# Patient Record
Sex: Female | Born: 1940 | Race: White | Hispanic: No | Smoking: Never smoker
Health system: Southern US, Community
[De-identification: ages and names within clinical notes are randomized; demographics above are authoritative.]

## PROBLEM LIST (undated history)

## (undated) DIAGNOSIS — M1990 Unspecified osteoarthritis, unspecified site: Secondary | ICD-10-CM

## (undated) DIAGNOSIS — I10 Essential (primary) hypertension: Secondary | ICD-10-CM

## (undated) DIAGNOSIS — M8580 Other specified disorders of bone density and structure, unspecified site: Secondary | ICD-10-CM

## (undated) HISTORY — PX: COLONOSCOPY: SHX174

## (undated) HISTORY — PX: CATARACT EXTRACTION, BILATERAL: SHX1313

## (undated) HISTORY — DX: Essential (primary) hypertension: I10

## (undated) HISTORY — DX: Unspecified osteoarthritis, unspecified site: M19.90

## (undated) HISTORY — PX: TOTAL ABDOMINAL HYSTERECTOMY W/ BILATERAL SALPINGOOPHORECTOMY: SHX83

## (undated) HISTORY — DX: Other specified disorders of bone density and structure, unspecified site: M85.80

## (undated) HISTORY — PX: TUBAL LIGATION: SHX77

## (undated) HISTORY — PX: BACK SURGERY: SHX140

---

## 1997-12-21 ENCOUNTER — Other Ambulatory Visit: Admission: RE | Admit: 1997-12-21 | Discharge: 1997-12-21 | Payer: Self-pay | Admitting: Obstetrics and Gynecology

## 1999-01-15 ENCOUNTER — Other Ambulatory Visit: Admission: RE | Admit: 1999-01-15 | Discharge: 1999-01-15 | Payer: Self-pay | Admitting: Urology

## 1999-02-05 ENCOUNTER — Other Ambulatory Visit: Admission: RE | Admit: 1999-02-05 | Discharge: 1999-02-05 | Payer: Self-pay | Admitting: Obstetrics and Gynecology

## 1999-09-12 ENCOUNTER — Other Ambulatory Visit: Admission: RE | Admit: 1999-09-12 | Discharge: 1999-09-12 | Payer: Self-pay | Admitting: Obstetrics and Gynecology

## 2000-02-20 ENCOUNTER — Other Ambulatory Visit: Admission: RE | Admit: 2000-02-20 | Discharge: 2000-02-20 | Payer: Self-pay | Admitting: Obstetrics and Gynecology

## 2001-05-16 ENCOUNTER — Other Ambulatory Visit: Admission: RE | Admit: 2001-05-16 | Discharge: 2001-05-16 | Payer: Self-pay | Admitting: Obstetrics and Gynecology

## 2002-06-14 ENCOUNTER — Other Ambulatory Visit: Admission: RE | Admit: 2002-06-14 | Discharge: 2002-06-14 | Payer: Self-pay | Admitting: Obstetrics and Gynecology

## 2003-06-09 ENCOUNTER — Emergency Department (HOSPITAL_COMMUNITY): Admission: EM | Admit: 2003-06-09 | Discharge: 2003-06-09 | Payer: Self-pay | Admitting: Emergency Medicine

## 2004-02-22 ENCOUNTER — Ambulatory Visit: Payer: Self-pay | Admitting: Internal Medicine

## 2004-12-24 ENCOUNTER — Ambulatory Visit: Payer: Self-pay | Admitting: Internal Medicine

## 2005-04-10 ENCOUNTER — Ambulatory Visit: Payer: Self-pay | Admitting: Internal Medicine

## 2006-02-04 ENCOUNTER — Ambulatory Visit: Payer: Self-pay | Admitting: Internal Medicine

## 2006-09-02 ENCOUNTER — Encounter: Payer: Self-pay | Admitting: Internal Medicine

## 2006-12-20 ENCOUNTER — Ambulatory Visit: Payer: Self-pay | Admitting: Internal Medicine

## 2006-12-20 DIAGNOSIS — I10 Essential (primary) hypertension: Secondary | ICD-10-CM | POA: Insufficient documentation

## 2006-12-23 ENCOUNTER — Encounter: Payer: Self-pay | Admitting: Internal Medicine

## 2007-01-04 ENCOUNTER — Encounter: Payer: Self-pay | Admitting: Internal Medicine

## 2007-04-25 ENCOUNTER — Encounter: Payer: Self-pay | Admitting: Internal Medicine

## 2007-07-25 ENCOUNTER — Encounter: Payer: Self-pay | Admitting: Internal Medicine

## 2007-08-29 ENCOUNTER — Encounter: Payer: Self-pay | Admitting: Internal Medicine

## 2007-09-06 ENCOUNTER — Ambulatory Visit: Payer: Self-pay | Admitting: Internal Medicine

## 2007-09-12 ENCOUNTER — Encounter (INDEPENDENT_AMBULATORY_CARE_PROVIDER_SITE_OTHER): Payer: Self-pay | Admitting: *Deleted

## 2007-10-25 ENCOUNTER — Ambulatory Visit: Payer: Self-pay | Admitting: Internal Medicine

## 2007-12-19 ENCOUNTER — Encounter: Payer: Self-pay | Admitting: Internal Medicine

## 2008-05-10 ENCOUNTER — Encounter: Payer: Self-pay | Admitting: Internal Medicine

## 2008-08-20 ENCOUNTER — Ambulatory Visit: Payer: Self-pay | Admitting: Internal Medicine

## 2008-08-20 DIAGNOSIS — M949 Disorder of cartilage, unspecified: Secondary | ICD-10-CM

## 2008-08-20 DIAGNOSIS — L4050 Arthropathic psoriasis, unspecified: Secondary | ICD-10-CM

## 2008-08-20 DIAGNOSIS — R7309 Other abnormal glucose: Secondary | ICD-10-CM

## 2008-08-20 DIAGNOSIS — M899 Disorder of bone, unspecified: Secondary | ICD-10-CM | POA: Insufficient documentation

## 2008-08-21 ENCOUNTER — Encounter: Payer: Self-pay | Admitting: Internal Medicine

## 2008-09-20 ENCOUNTER — Encounter: Payer: Self-pay | Admitting: Internal Medicine

## 2008-09-21 ENCOUNTER — Encounter: Payer: Self-pay | Admitting: Internal Medicine

## 2008-11-02 ENCOUNTER — Ambulatory Visit: Payer: Self-pay | Admitting: Internal Medicine

## 2009-03-14 ENCOUNTER — Encounter: Payer: Self-pay | Admitting: Internal Medicine

## 2009-07-25 ENCOUNTER — Encounter: Payer: Self-pay | Admitting: Internal Medicine

## 2009-09-10 ENCOUNTER — Ambulatory Visit: Payer: Self-pay | Admitting: Internal Medicine

## 2009-11-21 ENCOUNTER — Encounter: Payer: Self-pay | Admitting: Internal Medicine

## 2010-03-06 NOTE — Letter (Signed)
Summary: [facility]   Imported By: Lanelle Bal 08/12/2009 10:59:03  _____________________________________________________________________  External Attachment:    Type:   Image     Comment:   External Document

## 2010-03-06 NOTE — Letter (Signed)
Summary: [facility]   Imported By: Lanelle Bal 12/05/2009 12:13:45  _____________________________________________________________________  External Attachment:    Type:   Image     Comment:   External Document

## 2010-03-06 NOTE — Letter (Signed)
Summary: [facility]   Imported By: Lanelle Bal 03/27/2009 08:58:42  _____________________________________________________________________  External Attachment:    Type:   Image     Comment:   External Document

## 2010-03-06 NOTE — Assessment & Plan Note (Signed)
Summary: roa//lch   Vital Signs:  Patient profile:   70 year old female Weight:      167 pounds BMI:     30.66 Pulse rate:   64 / minute Resp:     15 per minute BP sitting:   124 / 82  (left arm) Cuff size:   large  Vitals Entered By: Shonna Chock CMA (September 10, 2009 3:01 PM) CC: 1.) Follow-up visit: refill meds 2.) Examine Back   CC:  1.) Follow-up visit: refill meds 2.) Examine Back.  History of Present Illness:  Hypertension Follow-Up      This is a 70 year old woman who presents for Hypertension follow-up.  The patient denies lightheadedness, urinary frequency, headaches, rash, and fatigue.  The patient denies the following associated symptoms: chest pain, chest pressure, exercise intolerance, dyspnea, palpitations, syncope, leg edema, and pedal edema.  Compliance with medications (by patient report) has been near 100%.  The patient reports that dietary compliance has been good.  The patient reports exercising 5X /week.  Adjunctive measures currently used by the patient include  modified salt restriction.  BP @ home 120-130/ 70-80. Labs done @ Dr Jeanine Luz every 8 weeks.Creatinine was 0.7.  Current Medications (verified): 1)  Hydrochlorothiazide 25 Mg  Tabs (Hydrochlorothiazide) .... 1/2 Tab Once Daily Needs Office Visit 2)  Toprol Xl 50 Mg Tb24 (Metoprolol Succinate) .... Take One-Half Tablet Daily 3)  Calcium With Vit D .... Take By Mouth As Directed 4)  Methotrexate 2.5 Mg  Tabs (Methotrexate Sodium) .... Take 6 Tablets Every Week By Mouth 5)  Folic Acid .... Take 1 Every Day 6)  Centrum Silver 7)  Vit C 8)  Fish Oil 9)  Vitamin D 2000 Unit Tabs (Cholecalciferol) .Marland Kitchen.. 1 By Mouth Once Daily  Allergies (verified): No Known Drug Allergies  Past History:  Past Medical History: Hypertension; Psoriatic arthritis, Dr  Syliva Overman PMH  of reaction to smallpox vaccine (convulsion, facial drawing); Hyperglycemia  Osteopenia, Dr Richardean Chimera  Physical Exam  General:   well-nourished; alert,appropriate and cooperative throughout examination Neck:  No deformities, masses, or tenderness noted. Lungs:  Normal respiratory effort, chest expands symmetrically. Lungs are clear to auscultation, no crackles or wheezes. Heart:  Normal rate and regular rhythm. S1 and S2 normal without gallop, murmur, click, rub . Abdomen:  Bowel sounds positive,abdomen soft and non-tender without masses, organomegaly or hernias noted. No AAA or bruits Pulses:  R and L carotid,radial,dorsalis pedis and posterior tibial pulses are full and equal bilaterally Extremities:  No clubbing, cyanosis, edema. Neurologic:  alert & oriented X3 and DTRs symmetrical and normal.   Psych:  memory intact for recent and remote, normally interactive, and good eye contact.     Impression & Recommendations:  Problem # 1:  HYPERTENSION, ESSENTIAL NOS (ICD-401.9) controlled Her updated medication list for this problem includes:    Hydrochlorothiazide 25 Mg Tabs (Hydrochlorothiazide) .Marland Kitchen... 1/2 tab once daily    Toprol Xl 50 Mg Tb24 (Metoprolol succinate) .Marland Kitchen... Take one-half tablet daily  Complete Medication List: 1)  Hydrochlorothiazide 25 Mg Tabs (Hydrochlorothiazide) .... 1/2 tab once daily 2)  Toprol Xl 50 Mg Tb24 (Metoprolol succinate) .... Take one-half tablet daily 3)  Calcium With Vit D  .... Take by mouth as directed 4)  Methotrexate 2.5 Mg Tabs (Methotrexate sodium) .... Take 6 tablets every week by mouth 5)  Folic Acid  .... Take 1 every day 6)  Centrum Silver  7)  Vit C  8)  Fish  Oil  9)  Vitamin D 2000 Unit Tabs (Cholecalciferol) .Marland Kitchen.. 1 by mouth once daily  Patient Instructions: 1)  Please have fasting labs done @ Dr Jeanine Luz office: 2)  BMP prior to visit, ICD-9:401.9 3)  Lipid Panel prior to visit, ICD-9:401.9 4)  Check your Blood Pressure regularly. If it is above:135/85 ON AVERAGE  you should make an appointment. Prescriptions: TOPROL XL 50 MG TB24 (METOPROLOL SUCCINATE) Take  one-half tablet daily  #90 x 1   Entered and Authorized by:   Marga Melnick MD   Signed by:   Marga Melnick MD on 09/10/2009   Method used:   Print then Give to Patient   RxID:   [id number] HYDROCHLOROTHIAZIDE 25 MG  TABS (HYDROCHLOROTHIAZIDE) 1/2 tab once daily  #90 x 1   Entered and Authorized by:   Marga Melnick MD   Signed by:   Marga Melnick MD on 09/10/2009   Method used:   Print then Give to Patient   RxID:   [id number]

## 2010-03-20 ENCOUNTER — Encounter: Payer: Self-pay | Admitting: Internal Medicine

## 2010-03-26 ENCOUNTER — Ambulatory Visit (INDEPENDENT_AMBULATORY_CARE_PROVIDER_SITE_OTHER): Payer: Medicare Other | Admitting: Internal Medicine

## 2010-03-26 ENCOUNTER — Encounter: Payer: Self-pay | Admitting: Internal Medicine

## 2010-03-26 DIAGNOSIS — I10 Essential (primary) hypertension: Secondary | ICD-10-CM

## 2010-04-01 NOTE — Assessment & Plan Note (Signed)
Summary: at [facility] doctor, blood pressure was 180/90, will bring cuff to c...   Vital Signs:  Patient profile:   70 year old female Weight:      167.4 pounds BMI:     30.73 Temp:     98.2 degrees F oral Pulse rate:   72 / minute Resp:     14 per minute BP sitting:   142 / 88  (left arm) Cuff size:   large  Vitals Entered By: Shonna Chock CMA (March 26, 2010 10:27 AM) CC: Elevated B/P, compared cuffs(wrist cuff): 140/62, pulse: 71   CC:  Elevated B/P, compared cuffs(wrist cuff): 140/62, and pulse: 71.  History of Present Illness:    Michelle [name] has had elevated BP for 2-3 months; 129/68- 157/83 with wrist cuff @ home. BP @ Dr Jeanine Luz office last week was 180/90. She  denies lightheadedness, urinary frequency, headaches, edema, and fatigue.  The patient denies the following associated symptoms: chest pain, chest pressure, exercise intolerance, dyspnea, palpitations, and syncope.  Compliance with medications (by patient report) has been near 100%.  The patient reports that dietary compliance has been fair.  The patient reports exercising daily.  Adjunctive measures currently  NOT used by the patient include salt restriction.  Labs were "OK" last week (Note: this has included creat & BUN in past)   Current Medications (verified): 1)  Hydrochlorothiazide 25 Mg  Tabs (Hydrochlorothiazide) .... 1/2 Tab Once Daily 2)  Toprol Xl 50 Mg Tb24 (Metoprolol Succinate) .... Take One-Half Tablet Daily 3)  Calcium With Vit D .... Take By Mouth As Directed 4)  Methotrexate 2.5 Mg  Tabs (Methotrexate Sodium) .... Take 6 Tablets Every Week By Mouth 5)  Folic Acid .... Take 1 Every Day 6)  Centrum Silver 7)  Vit C 8)  Fish Oil 9)  Vitamin D 2000 Unit Tabs (Cholecalciferol) .Marland Kitchen.. 1 By Mouth Once Daily  Allergies (verified): No Known Drug Allergies  Physical Exam  General:  well-nourished,in no acute distress; alert,appropriate and cooperative throughout examination Lungs:  Normal respiratory effort,  chest expands symmetrically. Lungs are clear to auscultation, no crackles or wheezes. Heart:  normal rate, regular rhythm, no gallop, no rub, no JVD, no HJR, and grade1/2   /6 systolic murmur @ SB.   Abdomen:  Bowel sounds positive,abdomen soft and non-tender without masses, organomegaly or hernias noted. No AAA or bruits Pulses:  R and L carotid,radial,dorsalis pedis and posterior tibial pulses are full and equal bilaterally Extremities:  No clubbing, cyanosis, edema.   Impression & Recommendations:  Problem # 1:  HYPERTENSION, ESSENTIAL NOS (ICD-401.9) suboptimal control Her updated medication list for this problem includes:    Hydrochlorothiazide 25 Mg Tabs (Hydrochlorothiazide) .Marland Kitchen... 1/2 tab once daily    Toprol Xl 50 Mg Tb24 (Metoprolol succinate) .Marland Kitchen... Take  1 once daily  Complete Medication List: 1)  Hydrochlorothiazide 25 Mg Tabs (Hydrochlorothiazide) .... 1/2 tab once daily 2)  Toprol Xl 50 Mg Tb24 (Metoprolol succinate) .... Take  1 once daily 3)  Calcium With Vit D  .... Take by mouth as directed 4)  Methotrexate 2.5 Mg Tabs (Methotrexate sodium) .... Take 6 tablets every week by mouth 5)  Folic Acid  .... Take 1 every day 6)  Centrum Silver  7)  Vit C  8)  Fish Oil  9)  Vitamin D 2000 Unit Tabs (Cholecalciferol) .Marland Kitchen.. 1 by mouth once daily  Patient Instructions: 1)  Check your Blood Pressure regularly. If it is above:  135/85 ON AVERAGE  you should call. Verify that wrist cuff is dependable. 2)  Limit your Sodium (Salt) to less than 2 grams a day(slightly less than 1/2 a teaspoon) to prevent fluid retention, swelling, or worsening of symptoms. Prescriptions: TOPROL XL 50 MG TB24 (METOPROLOL SUCCINATE) Take  1 once daily  #90 x 1   Entered and Authorized by:   Marga Melnick MD   Signed by:   Marga Melnick MD on 03/26/2010   Method used:   Print then Give to Patient   RxID:   [id number]    Orders Added: 1)  Est. Patient Level III [56213]

## 2010-04-15 NOTE — Letter (Signed)
Summary: [facility]   Imported By: Maryln Gottron 04/08/2010 14:15:55  _____________________________________________________________________  External Attachment:    Type:   Image     Comment:   External Document

## 2010-08-04 ENCOUNTER — Ambulatory Visit (INDEPENDENT_AMBULATORY_CARE_PROVIDER_SITE_OTHER): Payer: Medicare Other | Admitting: Family Medicine

## 2010-08-04 ENCOUNTER — Encounter: Payer: Self-pay | Admitting: Family Medicine

## 2010-08-04 VITALS — BP 156/90 | HR 66 | Temp 97.5°F | Wt 168.0 lb

## 2010-08-04 DIAGNOSIS — M549 Dorsalgia, unspecified: Secondary | ICD-10-CM

## 2010-08-04 NOTE — Progress Notes (Signed)
  Subjective:    Michelle [name] is a 70 y.o. female who presents with right hip pain. Onset of the symptoms was several months ago. Inciting event: none. The patient reports the hip pain is worse with weight bearing and radiates to knee. Aggravating symptoms include: going up and down stairs and walking. Patient has had no prior hip problems. Previous visits for this problem: yes, last seen 1 month ago by rheumatology. Evaluation to date: plain films, which were abnormal  -? herniated disc.. Treatment to date: prescription analgesics, which have been somewhat effective.  The following portions of the patient's history were reviewed and updated as appropriate: allergies, current medications, past family history, past medical history, past social history, past surgical history and problem list.   Review of Systems Pertinent items are noted in HPI.   Objective:    BP 156/90  Pulse 66  Temp(Src) 97.5 F (36.4 C) (Oral)  Wt 168 lb (76.204 kg)  SpO2 96% Right hip: positives: pain with flexion and pain with movement of hip and negatives: no pain with heel impact  Left hip: normal   Imaging: X-ray R hip: + disc spac narrowing    Assessment:    R hip pain    Plan:    Educational materials distributed. X-rays per orders. Follow up in several days. mri Ls spine  F/u prn con't flexeril

## 2010-08-04 NOTE — Patient Instructions (Signed)
Back Pain & Injury Your back pain is most likely caused by a strain of the muscles or ligaments supporting the spine. Back strains cause pain and trouble moving because of muscle spasms. They may take several weeks to heal. Usually they are better in days.  Treatment for back pain includes:  Rest - Get bed rest as needed over the next day or two. Use a firm mattress and lie on your side with your knees slightly bent. If you lie on your back, put a pillow under your knees.   Early movement - Back pain improves most rapidly if you remain active. It is much more stressful on the back to sit or stand in one place. Do not sit, drive or stand in one place for more than 30 minutes at a time. Take short walks on level surfaces as soon as pain allows.   Limit bending and lifting - Do not bend over or lift anything over 20 pounds until instructed otherwise. Lift by bending your knees. Use your leg muscles to help. Keep the load close to your body and avoid twisting. Do not reach or do overhead work.   Medicines - Medicine to reduce pain and inflammation are helpful. Muscle-relaxing drugs may be prescribed.   Therapy - Put ice packs on your back every few hours for the first 2-3 days after your injury or as instructed. After that ice or heat may be alternated to reduce pain and spasm. Back exercises and gentle massage may be of some benefit. You should be examined again if your back pain is not better in one week.  SEEK IMMEDIATE MEDICAL CARE IF:  You have pain that radiates from your back into your legs.   You develop new bowel or bladder control problems.   You have unusual weakness or numbness in your arms or legs.   You develop nausea or vomiting.   You develop abdominal pain.   You feel faint.  Document Released: 01/19/2005 Document Re-Released: 10/29/2007 [employer] Patient Information 2011 [employer]. 

## 2010-08-05 ENCOUNTER — Encounter: Payer: Self-pay | Admitting: Family Medicine

## 2010-08-09 ENCOUNTER — Ambulatory Visit (HOSPITAL_BASED_OUTPATIENT_CLINIC_OR_DEPARTMENT_OTHER)
Admission: RE | Admit: 2010-08-09 | Discharge: 2010-08-09 | Disposition: A | Discharge status: Home / Self Care | Payer: Medicare Other | Source: Ambulatory Visit | Attending: Family Medicine | Admitting: Family Medicine

## 2010-08-09 DIAGNOSIS — M549 Dorsalgia, unspecified: Secondary | ICD-10-CM

## 2010-08-09 DIAGNOSIS — M545 Low back pain, unspecified: Secondary | ICD-10-CM | POA: Insufficient documentation

## 2010-08-09 DIAGNOSIS — M47817 Spondylosis without myelopathy or radiculopathy, lumbosacral region: Secondary | ICD-10-CM | POA: Insufficient documentation

## 2010-08-09 DIAGNOSIS — M519 Unspecified thoracic, thoracolumbar and lumbosacral intervertebral disc disorder: Secondary | ICD-10-CM | POA: Insufficient documentation

## 2010-08-11 ENCOUNTER — Telehealth: Payer: Self-pay

## 2010-08-11 DIAGNOSIS — IMO0002 Reserved for concepts with insufficient information to code with codable children: Secondary | ICD-10-CM

## 2010-08-11 NOTE — Telephone Encounter (Signed)
Discussed results with patient and she requested to see Dr.Jenkin's group the first available MD.... Order put in     [place]

## 2010-08-11 NOTE — Telephone Encounter (Signed)
Message copied by Arnette Norris on Mon Aug 11, 2010  9:08 AM ------      Message from: Lelon Perla      Created: Mon Aug 11, 2010  7:31 AM       Disc bulging and stenosis--- refer to neurosurgery

## 2010-08-15 ENCOUNTER — Telehealth: Payer: Self-pay | Admitting: *Deleted

## 2010-08-15 MED ORDER — TRAMADOL HCL 50 MG PO TABS: 50.0000 mg | ORAL_TABLET | Freq: Four times a day (QID) | ORAL | Status: DC | PRN

## 2010-08-15 NOTE — Telephone Encounter (Signed)
Ultram 50 mg 1 po q6h prn  #30   ----  Call if that is not strong enough

## 2010-08-15 NOTE — Telephone Encounter (Signed)
Pt states that she had MRI done which indicated that she had a bulging disc. Pt note that she does have pending appt with neurosurgery at end of month but needs something to help with pain. Pt has been taking OTC Advil which only relieve pain for about a hour then return. Pt also indicated that she is currently taking more then advise and pain is still not resolving. Please advise

## 2010-08-15 NOTE — Telephone Encounter (Signed)
Discuss with patient  

## 2010-09-12 ENCOUNTER — Ambulatory Visit (HOSPITAL_COMMUNITY)
Admission: RE | Admit: 2010-09-12 | Discharge: 2010-09-12 | Disposition: A | Discharge status: Home / Self Care | Payer: Medicare Other | Source: Ambulatory Visit | Attending: Neurosurgery | Admitting: Neurosurgery

## 2010-09-12 ENCOUNTER — Other Ambulatory Visit (HOSPITAL_COMMUNITY): Payer: Self-pay | Admitting: Neurosurgery

## 2010-09-12 ENCOUNTER — Encounter (HOSPITAL_COMMUNITY)
Admission: RE | Admit: 2010-09-12 | Discharge: 2010-09-12 | Disposition: A | Discharge status: Home / Self Care | Payer: Medicare Other | Source: Ambulatory Visit | Attending: Neurosurgery | Admitting: Neurosurgery

## 2010-09-12 ENCOUNTER — Other Ambulatory Visit: Payer: Self-pay | Admitting: Internal Medicine

## 2010-09-12 DIAGNOSIS — Z01810 Encounter for preprocedural cardiovascular examination: Secondary | ICD-10-CM | POA: Insufficient documentation

## 2010-09-12 DIAGNOSIS — Z01818 Encounter for other preprocedural examination: Secondary | ICD-10-CM | POA: Insufficient documentation

## 2010-09-12 DIAGNOSIS — Z01812 Encounter for preprocedural laboratory examination: Secondary | ICD-10-CM | POA: Insufficient documentation

## 2010-09-12 DIAGNOSIS — M5136 Other intervertebral disc degeneration, lumbar region: Secondary | ICD-10-CM

## 2010-09-12 LAB — ABO/RH: ABO/RH(D): O POS

## 2010-09-12 LAB — BASIC METABOLIC PANEL
BUN: 11 mg/dL (ref 6–23)
CO2: 30 mEq/L (ref 19–32)
Calcium: 10.6 mg/dL — ABNORMAL HIGH (ref 8.4–10.5)
Chloride: 99 mEq/L (ref 96–112)
GFR calc Af Amer: >60 mL/min (ref >60)
GFR calc non Af Amer: >60 mL/min (ref >60)
Glucose, Bld: 103 mg/dL — ABNORMAL HIGH (ref 70–99)

## 2010-09-12 LAB — CBC
Hemoglobin: 13.7 g/dL (ref 12.0–15.0)
MCHC: 34.6 g/dL (ref 30.0–36.0)
Platelets: 346 10*3/uL (ref 150–400)
RBC: 4.43 MIL/uL (ref 3.87–5.11)
WBC: 7.9 10*3/uL (ref 4.0–10.5)

## 2010-09-16 ENCOUNTER — Encounter: Payer: Self-pay | Admitting: Family Medicine

## 2010-09-16 ENCOUNTER — Ambulatory Visit (INDEPENDENT_AMBULATORY_CARE_PROVIDER_SITE_OTHER): Payer: Medicare Other | Admitting: Family Medicine

## 2010-09-16 DIAGNOSIS — M48061 Spinal stenosis, lumbar region without neurogenic claudication: Secondary | ICD-10-CM | POA: Insufficient documentation

## 2010-09-16 NOTE — Progress Notes (Signed)
  Subjective:    Patient ID: Michelle [name], female    DOB: 05-May-1940, 70 y.o.   MRN: [id number]  HPIPt is here f/u N/S.  Pt is having surgery in 3 days.   She has brought MRI with her.     Review of Systems As above    Objective:   Physical Exam  Constitutional: She appears well-developed and well-nourished.  Psychiatric: She has a normal mood and affect. Her behavior is normal. Judgment and thought content normal.          Assessment & Plan:

## 2010-09-17 ENCOUNTER — Other Ambulatory Visit: Payer: Self-pay | Admitting: Family Medicine

## 2010-09-17 NOTE — Telephone Encounter (Signed)
Last Ov 09-16-10, 08-15-10 #30

## 2010-09-22 ENCOUNTER — Other Ambulatory Visit: Payer: Self-pay | Admitting: Neurosurgery

## 2010-09-22 ENCOUNTER — Ambulatory Visit (HOSPITAL_COMMUNITY)
Admission: RE | Admit: 2010-09-22 | Discharge: 2010-09-22 | Disposition: A | Discharge status: Home / Self Care | Payer: Medicare Other | Source: Ambulatory Visit | Attending: Neurosurgery | Admitting: Neurosurgery

## 2010-09-22 ENCOUNTER — Inpatient Hospital Stay (HOSPITAL_COMMUNITY)
Admission: RE | Admit: 2010-09-22 | Discharge: 2010-09-26 | DRG: 460 | Disposition: A | Discharge status: Home Health | Payer: Medicare Other | Source: Ambulatory Visit | Attending: Neurosurgery | Admitting: Neurosurgery

## 2010-09-22 DIAGNOSIS — M545 Low back pain, unspecified: Secondary | ICD-10-CM

## 2010-09-22 DIAGNOSIS — Z01818 Encounter for other preprocedural examination: Secondary | ICD-10-CM

## 2010-09-22 DIAGNOSIS — M5106 Intervertebral disc disorders with myelopathy, lumbar region: Principal | ICD-10-CM | POA: Diagnosis present

## 2010-09-22 DIAGNOSIS — I10 Essential (primary) hypertension: Secondary | ICD-10-CM | POA: Diagnosis present

## 2010-09-22 DIAGNOSIS — M4310 Spondylolisthesis, site unspecified: Secondary | ICD-10-CM | POA: Diagnosis present

## 2010-09-22 DIAGNOSIS — Z01812 Encounter for preprocedural laboratory examination: Secondary | ICD-10-CM

## 2010-09-22 DIAGNOSIS — Z01810 Encounter for preprocedural cardiovascular examination: Secondary | ICD-10-CM

## 2010-09-23 LAB — CBC
HCT: 35.7 % — ABNORMAL LOW (ref 36.0–46.0)
Hemoglobin: 12.4 g/dL (ref 12.0–15.0)
MCH: 30.8 pg (ref 26.0–34.0)
MCV: 88.6 fL (ref 78.0–100.0)
RBC: 4.03 MIL/uL (ref 3.87–5.11)
WBC: 10.8 10*3/uL — ABNORMAL HIGH (ref 4.0–10.5)

## 2010-09-23 LAB — BASIC METABOLIC PANEL
BUN: 9 mg/dL (ref 6–23)
Calcium: 9.4 mg/dL (ref 8.4–10.5)
Creatinine, Ser: 0.51 mg/dL (ref 0.50–1.10)
GFR calc non Af Amer: >60 mL/min (ref >60)
Glucose, Bld: 125 mg/dL — ABNORMAL HIGH (ref 70–99)
Sodium: 139 mEq/L (ref 135–145)

## 2010-09-25 ENCOUNTER — Inpatient Hospital Stay (HOSPITAL_COMMUNITY): Payer: Medicare Other

## 2010-09-25 LAB — URINE MICROSCOPIC-ADD ON

## 2010-09-25 LAB — CBC
HCT: 31.6 % — ABNORMAL LOW (ref 36.0–46.0)
Hemoglobin: 11.1 g/dL — ABNORMAL LOW (ref 12.0–15.0)
MCH: 31 pg (ref 26.0–34.0)
MCHC: 35.1 g/dL (ref 30.0–36.0)
MCV: 88.3 fL (ref 78.0–100.0)

## 2010-09-25 LAB — URINALYSIS, ROUTINE W REFLEX MICROSCOPIC
Glucose, UA: NEGATIVE mg/dL
Ketones, ur: NEGATIVE mg/dL
Protein, ur: 30 mg/dL — AB

## 2010-09-25 LAB — DIFFERENTIAL
Eosinophils Relative: 0 % (ref 0–5)
Lymphs Abs: 1 10*3/uL (ref 0.7–4.0)
Monocytes Relative: 5 % (ref 3–12)
Neutro Abs: 6 10*3/uL (ref 1.7–7.7)

## 2010-09-27 LAB — URINE CULTURE
Culture  Setup Time: 201208240429
Special Requests: NEGATIVE

## 2010-09-29 NOTE — Op Note (Signed)
NAMEMarland Kitchen  Michelle [name], Michelle [name] NO.:  [id number]  MEDICAL RECORD NO.:  [id number]  LOCATION:  3016                         FACILITY:  [facility]  PHYSICIAN:  Cristi Loron, M.D.DATE OF BIRTH:  06-09-1940  DATE OF PROCEDURE:  09/22/2010 DATE OF DISCHARGE:                              OPERATIVE REPORT   BRIEF HISTORY:  The patient is a 70 year old white female who has developed back and leg pain consistent with neurogenic claudication. She has failed medical management, was worked up with a lumbar MRI and lumbar x-rays, which demonstrated the patient had spondylolisthesis and severe spinal stenosis at L4-5.  I discussed the various treatment options with the patient including surgery.  She has weighed the risks, benefits, and alternatives of surgery and decided to proceed with the L4- L5 decompression, instrumentation, and fusion.  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  POSTOPERATIVE DIAGNOSES:  L4-5 grade 1 acquired spondylolisthesis, degenerative disk disease, spinal stenosis, lumbar radiculopathy/myelopathy, lumbago.  PROCEDURE:  Bilateral L4 laminotomies and foraminotomies to decompress the bilateral L4 and L5 nerve roots (in addition to work required to do posterior lumbar fusion because of severe spinal stenosis and facet arthropathy and disk and neural compression required a wide decompression of the L4 and L5 nerve roots); L4-5 posterior lumbar fusion with local morselized autograft bone and Actifuse bone graft extender; insertion of L4-5 interbody prosthesis (globus PEEK interbody prosthesis); L4-5 posterior nonsegmental instrumentation with globus titanium pedicle screws and rods; L4-5 posterolateral arthrodesis with local morselized autograft bone and Vitoss bone graft extender.  SURGEON:  Cristi Loron, MD  ASSISTANT:  Hewitt Shorts, MD.  ANESTHESIA:  General endotracheal.  ESTIMATED BLOOD LOSS:  200 mL.  SPECIMENS:  None.  DRAINS:  None.  COMPLICATIONS:  None.  DESCRIPTION OF PROCEDURE:  The patient was brought to the operating room by anesthesia team.  General endotracheal anesthesia was induced.  The patient was turned to the prone position on the Wilson frame.  The lumbosacral region was then prepared with Betadine scrub and Betadine solution.  Sterile drapes were applied.  I then injected the area to be incised with Marcaine with epinephrine solution, and I used a scalpel to make a linear midline incision over the L4-5 interspace.  I used electrocautery to perform a bilateral subperiosteal dissection exposing the spinous process and lamina of L3, L3, L5.  We obtained intraoperative radiograph to confirm location and inserted the first retractor for exposure.  I began the decompression by performing bilateral L4 laminotomies with a high-speed drill and then widened these laminotomies with Kerrison punch, removed the L4-5 ligamentum flavum as well as cephalad aspect of the L5 lamina.  I performed wide foraminotomies about the bilateral L4 as well as L5 nerve roots completing the decompression.  We now turned attention to the posterior lumbar interbody fusion by incising L4-5 intervertebral disks bilaterally with a 15 blade scalpel and performed a partial intervertebral diskectomy with the pituitary forceps.  We then prepared the vertebral endplates for fusion by using curettes to clear soft tissues.  We then distracted interspace and used trial spacers and determined used 10 x  26 mm interbody prosthesis bilaterally.  We prefilled these prosthesis with combination of local morselized autograft bone that we obtained during the decompression as well as Actifuse bone graft extender.  We then inserted prosthesis into the L4-5 interspace of course after retracting the neural structures out of harm's  way.  There was a good Snugfit of the prosthesis in the interspace.  We then filled the remainder of the disk space with local autograft bone and Actifuse completing the posterior lumbar interbody fusion.  We now turned our attention to the instrumentation.  Under fluoroscopic guidance, we cannulated the bilateral L4 and L5 pedicles with bone probe.  We tapped the pedicles with 6.5-mm tap.  We then removed the tap and then probed inside the tap pedicles to rule out cortical breeches. We then inserted 7.5 x 45 and 50 mm pedicle screws into the L4 and L5 pedicles under fluoroscopic guidance.  We got good bony purchase.  We then palpated along the medial aspect of the pedicles to rule out cortical breeches.  There were none.  We then connected the unilateral pedicle screws with lordotic rod.  We compressed the construct and secured the rod in place with caps. This completed the instrumentation.  We now turned attention to the posterolateral arthrodesis.  We used a high-speed drill to decorticate the remainder of the L4-5 facets, pars, transverse process, etc.  We then laid combination of local autograft bone and Vitoss bone graft extender over these decorticated posterolateral structures.  This completed posterolateral arthrodesis.  We then inspected the thecal sac and bilateral L4 and L5 nerve roots and noted they were well decompressed.  We obtained hemostasis using electrocautery.  We then irrigated the wound out with bacitracin solution.  We then removed the retractor and then reapproximated the patient's thoracolumbar fascia with interrupted #1 Vicryl suture, subcutaneous tissue with interrupted 2-0 Vicryl suture, and the skin with Steri-Strips and Benzoin.  The wound was then coated with bacitracin ointment.  A sterile dressing was applied.  The drapes were removed and the patient was subsequently returned to supine position where she was extubated by Anesthesia team and transported  to Post Anesthesia Care Unit in stable condition.  All sponge, instrument, and needle counts were correct at the end of this case.     Cristi Loron, M.D.     JDJ/MEDQ  D:  09/22/2010  T:  09/23/2010  Job:  [id number]  Electronically Signed by Tressie Stalker M.D. on 09/29/2010 07:41:36 AM

## 2010-10-02 LAB — CULTURE, BLOOD (ROUTINE X 2)
Culture  Setup Time: 201208240129
Culture: NO GROWTH

## 2010-10-16 NOTE — Discharge Summary (Signed)
  NAMEMarland Kitchen  Michelle [name], Michelle [name] NO.:  [id number]  MEDICAL RECORD NO.:  [id number]  LOCATION:  3016                         FACILITY:  [facility]  PHYSICIAN:  Cristi Loron, M.D.DATE OF BIRTH:  [birth year]  DATE OF ADMISSION:  09/22/2010 DATE OF DISCHARGE:  09/26/2010                              DISCHARGE SUMMARY   BRIEF HISTORY:  The patient is a 71 year old white female who has developed back and leg pain consistent with neurogenic claudication. She has failed medical management and was worked up with a lumbar MRI and lumbar x-rays which demonstrated the patient has spondylolisthesis and severe spinal stenosis at L4-5.  I discussed the various treatment options with the patient including surgery.  She has weighed the risks, benefits, and alternatives of surgery and decided to proceed with an L4- 5 decompression, instrumentation, and fusion.  For further details of this admission, please refer to typed history and physical.  HOSPITAL COURSE:  Admitted the patient to [facility] on September 22, 2010.  On day of admission, I performed L4-L5 decompression, instrumentation, and fusion.  The surgery went well (for full details of this operation, please refer to typed operative note).  POSTOPERATIVE COURSE:  The patient's postoperative course was remarkable only for a low-grade fever.  We worked up with a urinalysis, chest x- ray, blood cultures, etc., which turned out okay.  The fever resolved and the patient was discharged home on September 26, 2010.  DISCHARGE INSTRUCTIONS:  The patient was given written discharge structures to follow up with me in 4 weeks.  FINAL DIAGNOSES:  L4-5 grade 1 acquired spondylolisthesis, degenerative disease, spinal stenosis, lumbar radiculopathy, lumbago.  PROCEDURE PERFORMED:  Bilateral L4 laminotomies, foraminotomies to decompress bilateral L4 and L5 nerve roots; L4-5 posterior lumbar interbody fusion with local morselized  autograft bone and Actifuse bone graft extender; insertion of L4-5 interbody prosthesis (Globus PEEK interbody prosthesis), L4-5 posterior nonsegmental instrumentation with Globus titanium pedicle screws and rods; L4-5 posterolateral arthrodesis with local morselized autograft bone and Vitoss bone graft extender.     Cristi Loron, M.D.     JDJ/MEDQ  D:  10/09/2010  T:  10/09/2010  Job:  [id number]  Electronically Signed by Tressie Stalker M.D. on 10/16/2010 08:59:52 PM

## 2010-12-16 ENCOUNTER — Telehealth: Payer: Self-pay

## 2010-12-16 NOTE — Telephone Encounter (Signed)
Discuss with patient, however Pt would like to know if Dr Laury Axon received paperwork and if so can she fill it out and document that it needs to be completed by surgeon.

## 2010-12-16 NOTE — Telephone Encounter (Signed)
Pt called and stated she was referred to a neurosurgeon after an MRI.  Pt states she bought an [employer] loss [employer] on August 14, 2010 but after seeing the surgeon pt was told that she could not travel.  Pt states in order for her to get a refund she needs paperwork filled out stating "yes pt could travel on August 14, 2010 when the ticket and insurance was purchased but after seeing the surgeon this changed."  Pt states the paperwork should have come from [employer] [place] and should have been sent in September.  Pt would like to be contacted about this matter.  Pls advise.

## 2010-12-16 NOTE — Telephone Encounter (Deleted)
Left message to call office

## 2010-12-16 NOTE — Telephone Encounter (Signed)
Normally the surgeon fills this out

## 2010-12-17 NOTE — Telephone Encounter (Signed)
No I have not received anything  

## 2010-12-17 NOTE — Telephone Encounter (Signed)
Discussed with patient and she stated she would have them resend the paperwork   KP

## 2011-01-18 ENCOUNTER — Other Ambulatory Visit: Payer: Self-pay | Admitting: Internal Medicine

## 2011-03-16 DIAGNOSIS — Z79899 Other long term (current) drug therapy: Secondary | ICD-10-CM | POA: Diagnosis not present

## 2011-03-23 DIAGNOSIS — IMO0002 Reserved for concepts with insufficient information to code with codable children: Secondary | ICD-10-CM | POA: Diagnosis not present

## 2011-03-23 DIAGNOSIS — L4050 Arthropathic psoriasis, unspecified: Secondary | ICD-10-CM | POA: Diagnosis not present

## 2011-04-20 ENCOUNTER — Ambulatory Visit (INDEPENDENT_AMBULATORY_CARE_PROVIDER_SITE_OTHER): Payer: Medicare Other | Admitting: Family

## 2011-04-20 ENCOUNTER — Encounter: Payer: Self-pay | Admitting: Family

## 2011-04-20 ENCOUNTER — Ambulatory Visit (HOSPITAL_BASED_OUTPATIENT_CLINIC_OR_DEPARTMENT_OTHER)
Admission: RE | Admit: 2011-04-20 | Discharge: 2011-04-20 | Disposition: A | Discharge status: Home / Self Care | Payer: Medicare Other | Source: Ambulatory Visit | Attending: Family | Admitting: Family

## 2011-04-20 ENCOUNTER — Telehealth: Payer: Self-pay | Admitting: Family

## 2011-04-20 VITALS — BP 92/64 | HR 69 | Temp 97.7°F | Resp 16 | Wt 162.0 lb

## 2011-04-20 DIAGNOSIS — R509 Fever, unspecified: Secondary | ICD-10-CM | POA: Diagnosis not present

## 2011-04-20 DIAGNOSIS — I10 Essential (primary) hypertension: Secondary | ICD-10-CM | POA: Diagnosis not present

## 2011-04-20 DIAGNOSIS — J029 Acute pharyngitis, unspecified: Secondary | ICD-10-CM

## 2011-04-20 DIAGNOSIS — R0989 Other specified symptoms and signs involving the circulatory and respiratory systems: Secondary | ICD-10-CM

## 2011-04-20 DIAGNOSIS — R05 Cough: Secondary | ICD-10-CM | POA: Diagnosis not present

## 2011-04-20 DIAGNOSIS — J40 Bronchitis, not specified as acute or chronic: Secondary | ICD-10-CM

## 2011-04-20 DIAGNOSIS — R059 Cough, unspecified: Secondary | ICD-10-CM

## 2011-04-20 MED ORDER — CEFUROXIME AXETIL 500 MG PO TABS: 500.0000 mg | ORAL_TABLET | Freq: Two times a day (BID) | ORAL | Status: AC

## 2011-04-20 NOTE — Patient Instructions (Signed)
Hold your blood pressure medication (toprol/HCTZ if your blood pressure is <110/80) Drink 8-10 glasses of water a day.   Complete your chest x-ray on the first floor today. Follow up with Dr. Alwyn Ren later this week, sooner if symptoms worsen.

## 2011-04-20 NOTE — Progress Notes (Signed)
  Subjective:    Patient ID: Michelle [name], female    DOB: 14-Jan-1941, 71 y.o.   MRN: [id number]  HPI  Michelle [name] is a 71 yr old female who presents today with chief complaint of URI.  Started 2 weeks ago wth sore throat/chest congestion.  Felt better.  Last Wednesday developed cough.  Had subjective fever/chills over the weekend.     Review of Systems See HPI  Past Medical History  Diagnosis Date  . Hypertension   . Arthritis     psoriatic--Dr.Zieminski  . Osteopenia     Dr.John McComb  . Hyperglycemia     History   Social History  . Marital Status: Widowed    Spouse Name: N/A    Number of Children: N/A  . Years of Education: N/A   Occupational History  . Not on file.   Social History Main Topics  . Smoking status: Never Smoker   . Smokeless tobacco: Never Used  . Alcohol Use: No  . Drug Use: No  . Sexually Active: Not on file   Other Topics Concern  . Not on file   Social History Narrative  . No narrative on file    Past Surgical History  Procedure Date  . Total abdominal hysterectomy w/ bilateral salpingoophorectomy     For Fibroids  . Tubal ligation   . Cataract extraction, bilateral     Family History  Problem Relation Age of Onset  . Kidney cancer Father   . Hypertension Mother   . Autoimmune disease Mother     Hemolytic Anemia  . Breast cancer Maternal Aunt   . Hypertension Maternal Aunt   . Stroke Maternal Grandfather     No Known Allergies  Current Outpatient Prescriptions on File Prior to Visit  Medication Sig Dispense Refill  . folic acid (FOLVITE) 1 MG tablet Take 1 mg by mouth daily.        . hydrochlorothiazide (,MICROZIDE/HYDRODIURIL,) 12.5 MG capsule Take 12.5 mg by mouth daily.        . methotrexate (RHEUMATREX) 2.5 MG tablet Take 15 mg by mouth once a week. Caution:Chemotherapy. Protect from light.       . cyclobenzaprine (FLEXERIL) 10 MG tablet       . traMADol (ULTRAM) 50 MG tablet Take 50 mg by mouth every 6 (six) hours as  needed.        . traMADol (ULTRAM) 50 MG tablet TAKE 1 TABLET BY MOUTH EVERY 6 HOURS AS NEEDED FOR PAIN  30 tablet  0    BP 92/64  Pulse 69  Temp(Src) 97.7 F (36.5 C) (Oral)  Resp 16  Wt 162 lb 0.6 oz (73.501 kg)  SpO2 98%  LMP 04/19/1997       Objective:   Physical Exam  Constitutional: She appears well-developed and well-nourished. No distress.  HENT:  Head: Normocephalic and atraumatic.  Right Ear: Tympanic membrane and ear canal normal.  Left Ear: Tympanic membrane and ear canal normal.  Mouth/Throat: No oropharyngeal exudate, posterior oropharyngeal edema or posterior oropharyngeal erythema.  Cardiovascular: Normal rate and regular rhythm.   Psychiatric: She has a normal mood and affect. Her speech is normal and behavior is normal. Judgment and thought content normal. Cognition and memory are normal.          Assessment & Plan:

## 2011-04-20 NOTE — Telephone Encounter (Signed)
Reviewed chest x-ray.  Neg for pneumonia.  Left message re: neg CXR and to start ceftin.

## 2011-04-22 ENCOUNTER — Telehealth: Payer: Self-pay | Admitting: Internal Medicine

## 2011-04-22 DIAGNOSIS — J40 Bronchitis, not specified as acute or chronic: Secondary | ICD-10-CM | POA: Insufficient documentation

## 2011-04-22 MED ORDER — BENZONATATE 100 MG PO CAPS: 100.0000 mg | ORAL_CAPSULE | Freq: Three times a day (TID) | ORAL | Status: AC | PRN

## 2011-04-22 NOTE — Telephone Encounter (Signed)
Notified pt. 

## 2011-04-22 NOTE — Telephone Encounter (Signed)
Tessalon sent to her pharmacy.

## 2011-04-22 NOTE — Assessment & Plan Note (Signed)
BP Readings from Last 3 Encounters:  04/20/11 92/64  09/16/10 140/92  08/04/10 156/90  BP is a bit low today. Afebrile, neg CXR.  She took AM bp meds today.  I recommended aggressive hydration today, hold bp meds tomorrow if bp <110/80.  She verbalizes understanding.

## 2011-04-22 NOTE — Telephone Encounter (Signed)
Patient called in saying that she saw [name] on Monday for a sinus infection. She was given antiobiotics and that is helping. She states that she is still coughing though and would like something called in to [facility] on [address].

## 2011-04-22 NOTE — Assessment & Plan Note (Addendum)
70 yr old female with bronchitis.  CXR is negative for pneumonia.  I recommended that she start ceftin.

## 2011-06-01 ENCOUNTER — Other Ambulatory Visit: Payer: Self-pay | Admitting: Internal Medicine

## 2011-06-01 NOTE — Telephone Encounter (Signed)
Patient needs to schedule a CPX within the next 90 days

## 2011-07-21 DIAGNOSIS — L4050 Arthropathic psoriasis, unspecified: Secondary | ICD-10-CM | POA: Diagnosis not present

## 2011-07-21 DIAGNOSIS — M6530 Trigger finger, unspecified finger: Secondary | ICD-10-CM | POA: Diagnosis not present

## 2011-07-21 DIAGNOSIS — IMO0002 Reserved for concepts with insufficient information to code with codable children: Secondary | ICD-10-CM | POA: Diagnosis not present

## 2011-08-13 DIAGNOSIS — Z961 Presence of intraocular lens: Secondary | ICD-10-CM | POA: Diagnosis not present

## 2011-08-14 DIAGNOSIS — M4310 Spondylolisthesis, site unspecified: Secondary | ICD-10-CM | POA: Diagnosis not present

## 2011-09-04 ENCOUNTER — Ambulatory Visit (INDEPENDENT_AMBULATORY_CARE_PROVIDER_SITE_OTHER): Payer: Medicare Other | Admitting: Internal Medicine

## 2011-09-04 ENCOUNTER — Encounter: Payer: Self-pay | Admitting: Internal Medicine

## 2011-09-04 VITALS — BP 132/84 | HR 62 | Temp 98.4°F | Wt 167.8 lb

## 2011-09-04 DIAGNOSIS — Z8739 Personal history of other diseases of the musculoskeletal system and connective tissue: Secondary | ICD-10-CM | POA: Diagnosis not present

## 2011-09-04 DIAGNOSIS — E559 Vitamin D deficiency, unspecified: Secondary | ICD-10-CM

## 2011-09-04 DIAGNOSIS — R252 Cramp and spasm: Secondary | ICD-10-CM

## 2011-09-04 NOTE — Patient Instructions (Addendum)
The normal goal for  Vitamin D is 40-60. Vitamin D, along with calcium( 600 mg twice a day) & weight bearing exercises ( @ least 30 minutes of walking @ least 3X/ week),  is essential for bone health. Vitamin D deficiency is the # 1 cause of muscle pain in women.  Please add serum magnesium and vitamin D3 level to labs 8/5. Codes: 729.82;733.90; 268.9

## 2011-09-04 NOTE — Progress Notes (Signed)
  Subjective:    Patient ID: Michelle [name], female    DOB: 20-Jul-1940, 71 y.o.   MRN: [id number]  HPI In the last 3 weeks she's developed intermittent cramps in her thighs. She denies restless leg syndrome symptoms. She had been off her calcium, but she has restarted this. She drank Gatorade yesterday with improvement in her symptoms.  She recently had labs done 07/21/11 and her rheumatologist. Potassium is 4.7, calcium 9.4. CBC and differential was normal.  She is due for followup bone density in September at her Gynecologist's office. At one time she did have osteopenia @ the hips. 3 years ago her vitamin D level was borderline      Review of Systems She has had some increased frequency of urine. She denies dysuria, pyuria, or hematuria.  Constitutional: no fever, chills, sweats, change in weight  Musculoskeletal:no   joint stiffness, redness, or swelling Skin:no rash, color change Neuro: no weakness; incontinence (stool/urine); numbness and tingling Heme:no lymphadenopathy; abnormal bruising or bleeding        Objective:   Physical Exam She appears healthy and well-nourished  She has no lymphadenopathy about the neck or axilla.  Deep tendon reflexes, strength, and tone are normal in the  extremities  She does have some prominent venous vascularity of the lower extremities.  Radial artery and pedal pulses are normal.  She has no clubbing, cyanosis, or edema.  No significant rashes are present.          Assessment & Plan:  #1 intermittent thigh cramps improved with Gatorade.  #2 history of osteopenia hips, improved on serial studies  #3 history of borderline vitamin D deficiency in the past.  Plan: I will recommend Gatorade Lite as needed. She has labs scheduled 8/5. I would recommend adding a magnesium level and vitamin D 3 level to those labs.

## 2011-09-07 DIAGNOSIS — E559 Vitamin D deficiency, unspecified: Secondary | ICD-10-CM | POA: Diagnosis not present

## 2011-09-07 DIAGNOSIS — R252 Cramp and spasm: Secondary | ICD-10-CM | POA: Diagnosis not present

## 2011-09-07 DIAGNOSIS — M899 Disorder of bone, unspecified: Secondary | ICD-10-CM | POA: Diagnosis not present

## 2011-09-07 DIAGNOSIS — L4050 Arthropathic psoriasis, unspecified: Secondary | ICD-10-CM | POA: Diagnosis not present

## 2011-09-10 ENCOUNTER — Other Ambulatory Visit: Payer: Self-pay | Admitting: Internal Medicine

## 2011-09-11 ENCOUNTER — Telehealth: Payer: Self-pay | Admitting: *Deleted

## 2011-09-11 NOTE — Telephone Encounter (Signed)
I called patient and gave her the fax number to side B to have results faxed.

## 2011-09-11 NOTE — Telephone Encounter (Signed)
Dr.Hopper please advise 

## 2011-09-11 NOTE — Telephone Encounter (Signed)
Pt states that she had labs that dr hopper request drawn at Dr Anson [place] office [facility]. Pt would like to know if we have received those labs and what are the results.Please advise

## 2011-09-11 NOTE — Telephone Encounter (Signed)
They are not in the electronic medical record. I recommend she call his nurse to have them mailed to her or faxed here.

## 2011-09-11 NOTE — Telephone Encounter (Signed)
I spoke with patient and she tried to call Dr.Beckman's office and there office was closed. She will try to reach them on Monday to have results faxed to [place]

## 2011-09-14 NOTE — Telephone Encounter (Signed)
I called patient, voicemail not activated on patient's phone, unable to leave message. I will try to reach patient again to verify that she contacted Dr.Beckman's office

## 2011-09-14 NOTE — Telephone Encounter (Signed)
Patient returned your call.     KP

## 2011-09-14 NOTE — Telephone Encounter (Signed)
Lab results received and placed on ledge for review, Dr.Hopper please review and advise

## 2011-09-15 NOTE — Telephone Encounter (Signed)
Magnesium is low normal at 1.7 ( normal values 1.6-2.6).Take MagCal qhs as needed for cramps. The normal goal for Vitamin D is 40-60. No change in vit D dose is Indicated; recheck annually.    I spoke with patient and verbalized understanding of Dr.Hopper's recommendations

## 2011-09-28 ENCOUNTER — Other Ambulatory Visit: Payer: Self-pay | Admitting: Internal Medicine

## 2011-10-08 ENCOUNTER — Telehealth: Payer: Self-pay | Admitting: Internal Medicine

## 2011-10-08 MED ORDER — ROPINIROLE HCL 0.25 MG PO TABS: ORAL_TABLET | ORAL | Status: DC

## 2011-10-08 NOTE — Telephone Encounter (Signed)
Generic Requip 0.25 mg one pill 2 hours before going to bed; dispense 30. Verify with the rheumatologist that you are not anemic and discuss the symptoms with that specialist as well.

## 2011-10-08 NOTE — Telephone Encounter (Signed)
Discuss with patient, Rx sent. 

## 2011-10-08 NOTE — Telephone Encounter (Signed)
PT called stated she has done as instructed from last visit in regards to leg cramps & taking magnesium And her leg cramps are getting worse. Does she need to be seen or does she need to be referred. Please note patient will be out of town next week Cb# [phone]

## 2011-11-02 DIAGNOSIS — M949 Disorder of cartilage, unspecified: Secondary | ICD-10-CM | POA: Diagnosis not present

## 2011-11-02 DIAGNOSIS — Z01419 Encounter for gynecological examination (general) (routine) without abnormal findings: Secondary | ICD-10-CM | POA: Diagnosis not present

## 2011-11-02 DIAGNOSIS — E8941 Symptomatic postprocedural ovarian failure: Secondary | ICD-10-CM | POA: Diagnosis not present

## 2011-11-13 DIAGNOSIS — M4310 Spondylolisthesis, site unspecified: Secondary | ICD-10-CM | POA: Diagnosis not present

## 2011-11-30 DIAGNOSIS — Z23 Encounter for immunization: Secondary | ICD-10-CM | POA: Diagnosis not present

## 2011-11-30 DIAGNOSIS — Z79899 Other long term (current) drug therapy: Secondary | ICD-10-CM | POA: Diagnosis not present

## 2011-11-30 DIAGNOSIS — IMO0002 Reserved for concepts with insufficient information to code with codable children: Secondary | ICD-10-CM | POA: Diagnosis not present

## 2011-11-30 DIAGNOSIS — L4050 Arthropathic psoriasis, unspecified: Secondary | ICD-10-CM | POA: Diagnosis not present

## 2011-11-30 DIAGNOSIS — M6530 Trigger finger, unspecified finger: Secondary | ICD-10-CM | POA: Diagnosis not present

## 2011-12-07 DIAGNOSIS — M545 Low back pain: Secondary | ICD-10-CM | POA: Diagnosis not present

## 2012-01-05 DIAGNOSIS — M4310 Spondylolisthesis, site unspecified: Secondary | ICD-10-CM | POA: Diagnosis not present

## 2012-01-06 ENCOUNTER — Other Ambulatory Visit: Payer: Self-pay | Admitting: Neurosurgery

## 2012-01-25 DIAGNOSIS — Z803 Family history of malignant neoplasm of breast: Secondary | ICD-10-CM | POA: Diagnosis not present

## 2012-01-25 DIAGNOSIS — Z1231 Encounter for screening mammogram for malignant neoplasm of breast: Secondary | ICD-10-CM | POA: Diagnosis not present

## 2012-01-28 ENCOUNTER — Encounter (HOSPITAL_COMMUNITY): Payer: Self-pay | Admitting: Pharmacy Technician

## 2012-01-28 DIAGNOSIS — N63 Unspecified lump in unspecified breast: Secondary | ICD-10-CM | POA: Diagnosis not present

## 2012-01-29 ENCOUNTER — Encounter (HOSPITAL_COMMUNITY): Payer: Self-pay

## 2012-01-29 ENCOUNTER — Ambulatory Visit (HOSPITAL_COMMUNITY)
Admission: RE | Admit: 2012-01-29 | Discharge: 2012-01-29 | Disposition: A | Discharge status: Home / Self Care | Payer: Medicare Other | Source: Ambulatory Visit | Attending: Neurosurgery | Admitting: Neurosurgery

## 2012-01-29 ENCOUNTER — Encounter (HOSPITAL_COMMUNITY)
Admission: RE | Admit: 2012-01-29 | Discharge: 2012-01-29 | Disposition: A | Discharge status: Home / Self Care | Payer: Medicare Other | Source: Ambulatory Visit | Attending: Neurosurgery | Admitting: Neurosurgery

## 2012-01-29 DIAGNOSIS — Z01818 Encounter for other preprocedural examination: Secondary | ICD-10-CM | POA: Insufficient documentation

## 2012-01-29 LAB — CBC
HCT: 41.2 % (ref 36.0–46.0)
Hemoglobin: 14 g/dL (ref 12.0–15.0)
MCV: 90.9 fL (ref 78.0–100.0)
RBC: 4.53 MIL/uL (ref 3.87–5.11)
WBC: 10.2 10*3/uL (ref 4.0–10.5)

## 2012-01-29 LAB — BASIC METABOLIC PANEL
CO2: 30 mEq/L (ref 19–32)
Chloride: 100 mEq/L (ref 96–112)
Creatinine, Ser: 0.69 mg/dL (ref 0.50–1.10)
Sodium: 140 mEq/L (ref 135–145)

## 2012-01-29 LAB — SURGICAL PCR SCREEN: Staphylococcus aureus: NEGATIVE

## 2012-01-29 LAB — TYPE AND SCREEN

## 2012-01-29 NOTE — Pre-Procedure Instructions (Signed)
[address] OLESYA WIKE  01/29/2012   Your procedure is scheduled on:  02-08-2012 1050 AM Monday  Report to [facility] at 0745 AM.  Call this number if you have problems the morning of surgery: [phone]   Remember:   Do not eat food or drink:After Midnight.      Take these medicines the morning of surgery with A SIP OF WATER: Metoprolol   Do not wear jewelry, make-up or nail polish.  Do not wear lotions, powders, or perfumes. You may wear deodorant.  Do not shave 48 hours prior to surgery.  Do not bring valuables to the hospital.  Contacts, dentures or bridgework may not be worn into surgery.  Leave suitcase in the car. After surgery it may be brought to your room.  For patients admitted to the hospital, checkout time is 11:00 AM the day of discharge.   Patients discharged the day of surgery will not be allowed to drive home.    Special Instructions: Shower using CHG 2 nights before surgery and the night before surgery.  If you shower the day of surgery use CHG.  Use special wash - you have one bottle of CHG for all showers.  You should use approximately 1/3 of the bottle for each shower.   Please read over the following fact sheets that you were given: Pain Booklet, Coughing and Deep Breathing, Blood Transfusion Information, MRSA Information and Surgical Site Infection Prevention

## 2012-02-01 NOTE — Consult Note (Signed)
Anesthesia chart review: Patient is a 71 year old female scheduled for exploration of fusion with L3-4 laminectomy and PLIF by Dr. Lovell Sheehan on 02/08/2011. History includes nonsmoker, obesity, hypertension, hyperglycemia, psoriatic arthritis (Dr. Jimmy Footman), osteopenia, L4 laminotomy/foraminotomy with L4-5 PLIF 09/2010, hysterectomy, cataract extraction.  PCP is listed as Dr. Marga Melnick.  Preoperative labs noted.  Chest x-ray on 01/29/2012 showed no evidence of acute cardiopulmonary disease.  EKG on 01/29/2012 showed normal sinus rhythm, septal infarct, age undetermined. It was not felt significantly changed from her previous EKG on 09/12/10.  If no significant change in her status then anticipate she can proceed as planned.  Shonna Chock, PA-C 02/01/12 1335

## 2012-02-07 MED ORDER — CEFAZOLIN SODIUM-DEXTROSE 2-3 GM-% IV SOLR
2.0000 g | INTRAVENOUS | Status: AC
  Administered 2012-02-08: 2 g via INTRAVENOUS
  Filled 2012-02-07: qty 50

## 2012-02-08 ENCOUNTER — Inpatient Hospital Stay (HOSPITAL_COMMUNITY): Payer: Medicare Other

## 2012-02-08 ENCOUNTER — Encounter (HOSPITAL_COMMUNITY): Payer: Self-pay | Admitting: Vascular Surgery

## 2012-02-08 ENCOUNTER — Encounter (HOSPITAL_COMMUNITY): Payer: Self-pay | Admitting: *Deleted

## 2012-02-08 ENCOUNTER — Encounter (HOSPITAL_COMMUNITY)
Admission: RE | Disposition: A | Discharge status: Home / Self Care | Payer: Self-pay | Source: Ambulatory Visit | Attending: Neurosurgery

## 2012-02-08 ENCOUNTER — Inpatient Hospital Stay (HOSPITAL_COMMUNITY): Payer: Medicare Other | Admitting: Vascular Surgery

## 2012-02-08 ENCOUNTER — Inpatient Hospital Stay (HOSPITAL_COMMUNITY)
Admission: RE | Admit: 2012-02-08 | Discharge: 2012-02-11 | DRG: 460 | Disposition: A | Discharge status: Home / Self Care | Payer: Medicare Other | Source: Ambulatory Visit | Attending: Neurosurgery | Admitting: Neurosurgery

## 2012-02-08 DIAGNOSIS — M4310 Spondylolisthesis, site unspecified: Secondary | ICD-10-CM | POA: Diagnosis not present

## 2012-02-08 DIAGNOSIS — I10 Essential (primary) hypertension: Secondary | ICD-10-CM | POA: Diagnosis not present

## 2012-02-08 DIAGNOSIS — M545 Low back pain, unspecified: Secondary | ICD-10-CM | POA: Diagnosis not present

## 2012-02-08 DIAGNOSIS — M949 Disorder of cartilage, unspecified: Secondary | ICD-10-CM | POA: Diagnosis present

## 2012-02-08 DIAGNOSIS — M5137 Other intervertebral disc degeneration, lumbosacral region: Principal | ICD-10-CM | POA: Diagnosis present

## 2012-02-08 DIAGNOSIS — M51379 Other intervertebral disc degeneration, lumbosacral region without mention of lumbar back pain or lower extremity pain: Principal | ICD-10-CM | POA: Diagnosis present

## 2012-02-08 DIAGNOSIS — M899 Disorder of bone, unspecified: Secondary | ICD-10-CM | POA: Diagnosis present

## 2012-02-08 DIAGNOSIS — M48062 Spinal stenosis, lumbar region with neurogenic claudication: Secondary | ICD-10-CM

## 2012-02-08 DIAGNOSIS — Z981 Arthrodesis status: Secondary | ICD-10-CM | POA: Diagnosis not present

## 2012-02-08 DIAGNOSIS — M48061 Spinal stenosis, lumbar region without neurogenic claudication: Secondary | ICD-10-CM | POA: Diagnosis not present

## 2012-02-08 DIAGNOSIS — IMO0002 Reserved for concepts with insufficient information to code with codable children: Secondary | ICD-10-CM | POA: Diagnosis not present

## 2012-02-08 SURGERY — POSTERIOR LUMBAR FUSION 1 LEVEL
Anesthesia: General | Site: Back | Wound class: Clean

## 2012-02-08 MED ORDER — METOPROLOL SUCCINATE ER 50 MG PO TB24
50.0000 mg | ORAL_TABLET | Freq: Every day | ORAL | Status: DC
  Administered 2012-02-09 – 2012-02-11 (×3): 50 mg via ORAL
  Filled 2012-02-08 (×3): qty 1

## 2012-02-08 MED ORDER — PHENOL 1.4 % MT LIQD: 1.0000 | OROMUCOSAL | Status: DC | PRN

## 2012-02-08 MED ORDER — 0.9 % SODIUM CHLORIDE (POUR BTL) OPTIME
TOPICAL | Status: DC | PRN
  Administered 2012-02-08: 1000 mL

## 2012-02-08 MED ORDER — MEPERIDINE HCL 25 MG/ML IJ SOLN: 6.2500 mg | INTRAMUSCULAR | Status: DC | PRN

## 2012-02-08 MED ORDER — SODIUM CHLORIDE 0.9 % IR SOLN
Status: DC | PRN
  Administered 2012-02-08: 11:00:00

## 2012-02-08 MED ORDER — THROMBIN 20000 UNITS EX SOLR
CUTANEOUS | Status: DC | PRN
  Administered 2012-02-08: 11:00:00 via TOPICAL

## 2012-02-08 MED ORDER — DIPHENHYDRAMINE HCL 50 MG/ML IJ SOLN: 12.5000 mg | Freq: Four times a day (QID) | INTRAMUSCULAR | Status: DC | PRN

## 2012-02-08 MED ORDER — ONDANSETRON HCL 4 MG/2ML IJ SOLN
INTRAMUSCULAR | Status: DC | PRN
  Administered 2012-02-08: 4 mg via INTRAVENOUS

## 2012-02-08 MED ORDER — ONE-DAILY MULTI VITAMINS PO TABS: 1.0000 | ORAL_TABLET | Freq: Every day | ORAL | Status: DC

## 2012-02-08 MED ORDER — BACITRACIN ZINC 500 UNIT/GM EX OINT
TOPICAL_OINTMENT | CUTANEOUS | Status: DC | PRN
  Administered 2012-02-08: 1 via TOPICAL

## 2012-02-08 MED ORDER — MIDAZOLAM HCL 5 MG/5ML IJ SOLN
INTRAMUSCULAR | Status: DC | PRN
  Administered 2012-02-08: 2 mg via INTRAVENOUS

## 2012-02-08 MED ORDER — NALOXONE HCL 0.4 MG/ML IJ SOLN: 0.4000 mg | INTRAMUSCULAR | Status: DC | PRN

## 2012-02-08 MED ORDER — PHENYLEPHRINE HCL 10 MG/ML IJ SOLN
INTRAMUSCULAR | Status: DC | PRN
  Administered 2012-02-08 (×5): 80 ug via INTRAVENOUS

## 2012-02-08 MED ORDER — ONDANSETRON HCL 4 MG/2ML IJ SOLN: 4.0000 mg | INTRAMUSCULAR | Status: DC | PRN

## 2012-02-08 MED ORDER — HYDROMORPHONE HCL PF 1 MG/ML IJ SOLN
INTRAMUSCULAR | Status: AC
  Filled 2012-02-08: qty 1

## 2012-02-08 MED ORDER — HYDROCHLOROTHIAZIDE 25 MG PO TABS
12.5000 mg | ORAL_TABLET | Freq: Every day | ORAL | Status: DC
  Filled 2012-02-08 (×2): qty 0.5

## 2012-02-08 MED ORDER — MENTHOL 3 MG MT LOZG: 1.0000 | LOZENGE | OROMUCOSAL | Status: DC | PRN

## 2012-02-08 MED ORDER — ACETAMINOPHEN 325 MG PO TABS: 650.0000 mg | ORAL_TABLET | ORAL | Status: DC | PRN

## 2012-02-08 MED ORDER — HYDROCHLOROTHIAZIDE 12.5 MG PO CAPS
12.5000 mg | ORAL_CAPSULE | Freq: Every day | ORAL | Status: DC
  Administered 2012-02-08 – 2012-02-11 (×4): 12.5 mg via ORAL
  Filled 2012-02-08 (×5): qty 1

## 2012-02-08 MED ORDER — BUPIVACAINE HCL (PF) 0.5 % IJ SOLN
INTRAMUSCULAR | Status: DC | PRN
  Administered 2012-02-08: 10 mL

## 2012-02-08 MED ORDER — DIPHENHYDRAMINE HCL 12.5 MG/5ML PO ELIX: 12.5000 mg | ORAL_SOLUTION | Freq: Four times a day (QID) | ORAL | Status: DC | PRN

## 2012-02-08 MED ORDER — SODIUM CHLORIDE 0.9 % IV SOLN
INTRAVENOUS | Status: AC
  Filled 2012-02-08: qty 500

## 2012-02-08 MED ORDER — LACTATED RINGERS IV SOLN
INTRAVENOUS | Status: DC | PRN
  Administered 2012-02-08 (×2): via INTRAVENOUS

## 2012-02-08 MED ORDER — PHENYLEPHRINE HCL 10 MG/ML IJ SOLN
10.0000 mg | INTRAVENOUS | Status: DC | PRN
  Administered 2012-02-08: 50 ug/min via INTRAVENOUS

## 2012-02-08 MED ORDER — ACETAMINOPHEN 650 MG RE SUPP: 650.0000 mg | RECTAL | Status: DC | PRN

## 2012-02-08 MED ORDER — PROPOFOL 10 MG/ML IV BOLUS
INTRAVENOUS | Status: DC | PRN
  Administered 2012-02-08: 120 mg via INTRAVENOUS

## 2012-02-08 MED ORDER — OXYCODONE HCL 5 MG/5ML PO SOLN: 5.0000 mg | Freq: Once | ORAL | Status: DC | PRN

## 2012-02-08 MED ORDER — HYDROCODONE-ACETAMINOPHEN 5-325 MG PO TABS: 1.0000 | ORAL_TABLET | ORAL | Status: DC | PRN

## 2012-02-08 MED ORDER — DOCUSATE SODIUM 100 MG PO CAPS
100.0000 mg | ORAL_CAPSULE | Freq: Two times a day (BID) | ORAL | Status: DC
Start: 2012-02-08 — End: 2012-02-11
  Administered 2012-02-08 – 2012-02-11 (×6): 100 mg via ORAL
  Filled 2012-02-08 (×6): qty 1

## 2012-02-08 MED ORDER — HYDROMORPHONE HCL PF 1 MG/ML IJ SOLN
0.2500 mg | INTRAMUSCULAR | Status: DC | PRN
  Administered 2012-02-08: 0.5 mg via INTRAVENOUS
  Administered 2012-02-08 (×2): 0.25 mg via INTRAVENOUS

## 2012-02-08 MED ORDER — CEFAZOLIN SODIUM-DEXTROSE 2-3 GM-% IV SOLR
2.0000 g | Freq: Three times a day (TID) | INTRAVENOUS | Status: AC
  Administered 2012-02-08 – 2012-02-09 (×2): 2 g via INTRAVENOUS
  Filled 2012-02-08 (×3): qty 50

## 2012-02-08 MED ORDER — VECURONIUM BROMIDE 10 MG IV SOLR
INTRAVENOUS | Status: DC | PRN
  Administered 2012-02-08: 3 mg via INTRAVENOUS
  Administered 2012-02-08 (×3): 2 mg via INTRAVENOUS

## 2012-02-08 MED ORDER — OXYCODONE HCL 5 MG PO TABS: 5.0000 mg | ORAL_TABLET | Freq: Once | ORAL | Status: DC | PRN

## 2012-02-08 MED ORDER — MORPHINE SULFATE (PF) 1 MG/ML IV SOLN
INTRAVENOUS | Status: DC
  Administered 2012-02-08: 1 mg via INTRAVENOUS
  Administered 2012-02-08: 2 mg via INTRAVENOUS
  Administered 2012-02-08: 14:00:00 via INTRAVENOUS
  Administered 2012-02-08: 2 mg via INTRAVENOUS
  Administered 2012-02-09: 3 mg via INTRAVENOUS
  Administered 2012-02-09 (×4): 1 mg via INTRAVENOUS
  Administered 2012-02-09: 3 mg via INTRAVENOUS
  Administered 2012-02-10: 1 mg via INTRAVENOUS

## 2012-02-08 MED ORDER — ARTIFICIAL TEARS OP OINT
TOPICAL_OINTMENT | OPHTHALMIC | Status: DC | PRN
  Administered 2012-02-08: 1 via OPHTHALMIC

## 2012-02-08 MED ORDER — ADULT MULTIVITAMIN W/MINERALS CH
1.0000 | ORAL_TABLET | Freq: Every day | ORAL | Status: DC
  Administered 2012-02-09 – 2012-02-11 (×3): 1 via ORAL
  Filled 2012-02-08 (×3): qty 1

## 2012-02-08 MED ORDER — LIDOCAINE HCL 4 % MT SOLN
OROMUCOSAL | Status: DC | PRN
  Administered 2012-02-08: 4 mL via TOPICAL

## 2012-02-08 MED ORDER — LACTATED RINGERS IV SOLN
INTRAVENOUS | Status: DC
  Administered 2012-02-08 – 2012-02-09 (×2): via INTRAVENOUS

## 2012-02-08 MED ORDER — ROCURONIUM BROMIDE 100 MG/10ML IV SOLN
INTRAVENOUS | Status: DC | PRN
  Administered 2012-02-08: 50 mg via INTRAVENOUS

## 2012-02-08 MED ORDER — BACITRACIN 50000 UNITS IM SOLR
INTRAMUSCULAR | Status: AC
  Filled 2012-02-08: qty 1

## 2012-02-08 MED ORDER — SODIUM CHLORIDE 0.9 % IJ SOLN: 9.0000 mL | INTRAMUSCULAR | Status: DC | PRN

## 2012-02-08 MED ORDER — OXYCODONE-ACETAMINOPHEN 5-325 MG PO TABS
1.0000 | ORAL_TABLET | ORAL | Status: DC | PRN
  Administered 2012-02-10 – 2012-02-11 (×4): 2 via ORAL
  Filled 2012-02-08 (×4): qty 2

## 2012-02-08 MED ORDER — GLYCOPYRROLATE 0.2 MG/ML IJ SOLN
INTRAMUSCULAR | Status: DC | PRN
  Administered 2012-02-08: 0.4 mg via INTRAVENOUS

## 2012-02-08 MED ORDER — ZOLPIDEM TARTRATE 5 MG PO TABS
5.0000 mg | ORAL_TABLET | Freq: Every evening | ORAL | Status: DC | PRN
  Filled 2012-02-08: qty 1

## 2012-02-08 MED ORDER — BUPIVACAINE LIPOSOME 1.3 % IJ SUSP
20.0000 mL | Freq: Once | INTRAMUSCULAR | Status: AC
  Administered 2012-02-08: 20 mL
  Filled 2012-02-08: qty 20

## 2012-02-08 MED ORDER — LIDOCAINE HCL (CARDIAC) 20 MG/ML IV SOLN
INTRAVENOUS | Status: DC | PRN
  Administered 2012-02-08: 100 mg via INTRAVENOUS

## 2012-02-08 MED ORDER — MORPHINE SULFATE (PF) 1 MG/ML IV SOLN
INTRAVENOUS | Status: AC
  Filled 2012-02-08: qty 25

## 2012-02-08 MED ORDER — FENTANYL CITRATE 0.05 MG/ML IJ SOLN
INTRAMUSCULAR | Status: DC | PRN
  Administered 2012-02-08: 50 ug via INTRAVENOUS
  Administered 2012-02-08: 150 ug via INTRAVENOUS
  Administered 2012-02-08: 25 ug via INTRAVENOUS

## 2012-02-08 MED ORDER — NEOSTIGMINE METHYLSULFATE 1 MG/ML IJ SOLN
INTRAMUSCULAR | Status: DC | PRN
  Administered 2012-02-08: 3 mg via INTRAVENOUS

## 2012-02-08 MED ORDER — ONDANSETRON HCL 4 MG/2ML IJ SOLN: 4.0000 mg | Freq: Once | INTRAMUSCULAR | Status: DC | PRN

## 2012-02-08 MED ORDER — ONDANSETRON HCL 4 MG/2ML IJ SOLN: 4.0000 mg | Freq: Four times a day (QID) | INTRAMUSCULAR | Status: DC | PRN

## 2012-02-08 SURGICAL SUPPLY — 66 items
BAG DECANTER FOR FLEXI CONT (MISCELLANEOUS) ×2 IMPLANT
BENZOIN TINCTURE PRP APPL 2/3 (GAUZE/BANDAGES/DRESSINGS) ×2 IMPLANT
BLADE SURG ROTATE 9660 (MISCELLANEOUS) IMPLANT
BRUSH SCRUB EZ PLAIN DRY (MISCELLANEOUS) ×2 IMPLANT
BUR ACORN 6.0 (BURR) ×2 IMPLANT
BUR MATCHSTICK NEURO 3.0 LAGG (BURR) ×2 IMPLANT
CANISTER SUCTION 2500CC (MISCELLANEOUS) ×2 IMPLANT
CAP REVERE LOCKING (Cap) ×12 IMPLANT
CLOTH BEACON ORANGE TIMEOUT ST (SAFETY) ×2 IMPLANT
CONT SPEC 4OZ CLIKSEAL STRL BL (MISCELLANEOUS) ×4 IMPLANT
COVER BACK TABLE 24X17X13 BIG (DRAPES) IMPLANT
COVER TABLE BACK 60X90 (DRAPES) ×2 IMPLANT
DRAPE C-ARM 42X72 X-RAY (DRAPES) ×4 IMPLANT
DRAPE LAPAROTOMY 100X72X124 (DRAPES) ×2 IMPLANT
DRAPE POUCH INSTRU U-SHP 10X18 (DRAPES) ×2 IMPLANT
DRAPE PROXIMA HALF (DRAPES) ×2 IMPLANT
DRAPE SURG 17X23 STRL (DRAPES) ×8 IMPLANT
ELECT BLADE 4.0 EZ CLEAN MEGAD (MISCELLANEOUS) ×4
ELECT REM PT RETURN 9FT ADLT (ELECTROSURGICAL) ×2
ELECTRODE BLDE 4.0 EZ CLN MEGD (MISCELLANEOUS) ×2 IMPLANT
ELECTRODE REM PT RTRN 9FT ADLT (ELECTROSURGICAL) ×1 IMPLANT
EVACUATOR 1/8 PVC DRAIN (DRAIN) ×2 IMPLANT
GAUZE SPONGE 4X4 16PLY XRAY LF (GAUZE/BANDAGES/DRESSINGS) IMPLANT
GLOVE BIO SURGEON STRL SZ8 (GLOVE) ×2 IMPLANT
GLOVE BIO SURGEON STRL SZ8.5 (GLOVE) ×4 IMPLANT
GLOVE BIOGEL PI IND STRL 8 (GLOVE) ×3 IMPLANT
GLOVE BIOGEL PI INDICATOR 8 (GLOVE) ×3
GLOVE EXAM NITRILE LRG STRL (GLOVE) IMPLANT
GLOVE EXAM NITRILE MD LF STRL (GLOVE) IMPLANT
GLOVE EXAM NITRILE XL STR (GLOVE) IMPLANT
GLOVE EXAM NITRILE XS STR PU (GLOVE) IMPLANT
GLOVE INDICATOR 8.5 STRL (GLOVE) ×2 IMPLANT
GLOVE SS BIOGEL STRL SZ 8 (GLOVE) ×2 IMPLANT
GLOVE SUPERSENSE BIOGEL SZ 8 (GLOVE) ×2
GOWN BRE IMP SLV AUR LG STRL (GOWN DISPOSABLE) IMPLANT
GOWN BRE IMP SLV AUR XL STRL (GOWN DISPOSABLE) ×4 IMPLANT
GOWN STRL REIN 2XL LVL4 (GOWN DISPOSABLE) ×2 IMPLANT
KIT BASIN OR (CUSTOM PROCEDURE TRAY) ×2 IMPLANT
KIT ROOM TURNOVER OR (KITS) ×2 IMPLANT
NEEDLE HYPO 21X1.5 SAFETY (NEEDLE) ×2 IMPLANT
NEEDLE HYPO 25X1 1.5 SAFETY (NEEDLE) ×2 IMPLANT
NS IRRIG 1000ML POUR BTL (IV SOLUTION) ×2 IMPLANT
PACK FOAM VITOSS 10CC (Orthopedic Implant) ×2 IMPLANT
PACK LAMINECTOMY NEURO (CUSTOM PROCEDURE TRAY) ×2 IMPLANT
PAD ARMBOARD 7.5X6 YLW CONV (MISCELLANEOUS) ×6 IMPLANT
PATTIES SURGICAL .5 X1 (DISPOSABLE) IMPLANT
PENCIL BUTTON HOLSTER BLD 10FT (ELECTRODE) ×2 IMPLANT
PUTTY 10ML ACTIFUSE ABX (Putty) ×2 IMPLANT
ROD REVERE CURVED 65MM (Rod) ×4 IMPLANT
SCREW REVERE 6.5X50MM (Screw) ×4 IMPLANT
SPACER SUSTAIN O 10X26 8MM (Spacer) ×4 IMPLANT
SPONGE GAUZE 4X4 12PLY (GAUZE/BANDAGES/DRESSINGS) ×2 IMPLANT
SPONGE LAP 4X18 X RAY DECT (DISPOSABLE) IMPLANT
SPONGE NEURO XRAY DETECT 1X3 (DISPOSABLE) IMPLANT
SPONGE SURGIFOAM ABS GEL 100 (HEMOSTASIS) ×2 IMPLANT
STRIP CLOSURE SKIN 1/2X4 (GAUZE/BANDAGES/DRESSINGS) ×2 IMPLANT
SUT VIC AB 1 CT1 18XBRD ANBCTR (SUTURE) ×2 IMPLANT
SUT VIC AB 1 CT1 8-18 (SUTURE) ×2
SUT VIC AB 2-0 CP2 18 (SUTURE) ×4 IMPLANT
SYR 20CC LL (SYRINGE) ×2 IMPLANT
SYR 20ML ECCENTRIC (SYRINGE) ×2 IMPLANT
TAPE CLOTH SURG 4X10 WHT LF (GAUZE/BANDAGES/DRESSINGS) ×2 IMPLANT
TOWEL OR 17X24 6PK STRL BLUE (TOWEL DISPOSABLE) ×2 IMPLANT
TOWEL OR 17X26 10 PK STRL BLUE (TOWEL DISPOSABLE) ×2 IMPLANT
TRAY FOLEY CATH 14FRSI W/METER (CATHETERS) ×2 IMPLANT
WATER STERILE IRR 1000ML POUR (IV SOLUTION) ×2 IMPLANT

## 2012-02-08 NOTE — Transfer of Care (Signed)
Immediate Anesthesia Transfer of Care Note  Patient: Michelle [name]  Procedure(s) Performed: Procedure(s) (LRB) with comments: POSTERIOR LUMBAR FUSION 1 LEVEL (N/A) - Exploration of fusion with Lumbar three-four laminectomy and posterior lumbar interbody fusion with interbody prothesis posterolateral arthrodesis and posterior segmental instrumentation  Patient Location: PACU  Anesthesia Type:General  Level of Consciousness: awake, alert  and oriented  Airway & Oxygen Therapy: Patient Spontanous Breathing and Patient connected to nasal cannula oxygen  Post-op Assessment: Report given to PACU RN, Post -op Vital signs reviewed and stable and Patient moving all extremities X 4  Post vital signs: Reviewed and stable  Complications: No apparent anesthesia complications

## 2012-02-08 NOTE — Op Note (Signed)
Brief history: The patient is a 72 year old white female who I performed a L4-5 decompression, each patient, and fusion on in the past. The patient has developed recurrent back buttocks and leg pain consistent with neurogenic claudication. She has failed medical management and was worked up with a lumbar MRI. This demonstrated the patient had severe stenosis at L3-4. I discussed the various treatment options with the patient including surgery. The patient has weighed the risks, benefits, and alternatives surgery and decided proceed with a L3-4 decompression, each patient, and fusion.  Preoperative diagnosis: L3-4 Degenerative disc disease, spinal stenosis compressing both the L3 and L4  nerve roots; lumbago; lumbar radiculopathy  Postoperative diagnosis: The same  Procedure: Bilateral L3 Laminotomy/foraminotomies to decompress the bilateral L3 and L4 nerve roots(the work required to do this was in addition to the work required to do the posterior lumbar interbody fusion because of the patient's spinal stenosis, facet arthropathy. Etc. requiring a wide decompression of the nerve roots.); L3-4 posterior lumbar interbody fusion with local morselized autograft bone and Actifusebone graft extender; insertion of interbody prosthesis at L3-4 (globus peek interbody prosthesis); posterior segmental instrumentation from L3 to L5 with globus titanium pedicle screws and rods; posterior lateral arthrodesis at L3-4 with local morselized autograft bone and Vitoss bone graft extender.  Surgeon: Dr. Delma Officer  Asst.: Dr. Mardelle Matte pool  Anesthesia: Gen. endotracheal  Estimated blood loss: 200 cc  Drains: None  Locations: None  Description of procedure: The patient was brought to the operating room by the anesthesia team. General endotracheal anesthesia was induced. The patient was turned to the prone position on the Wilson frame. The patient's lumbosacral region was then prepared with Betadine scrub and Betadine  solution. Sterile drapes were applied.  I then injected the area to be incised with Marcaine with epinephrine solution. I then used the scalpel to make a linear midline incision over the L3-4 interspace incising through the previous surgical scar.. I then used electrocautery to perform a bilateral subperiosteal dissection exposing the spinous process and lamina of L3, L4 and L5.We then inserted the Verstrac retractor to provide exposure.  I began the decompression by using the high speed drill to perform laminotomies at L3 and removed the cephalad aspect of the L4 lamina. We then used the Kerrison punches to widen the laminotomy and removed the ligamentum flavum at L3-4. We used the Kerrison punches to remove the medial facets at L3-4. We performed wide foraminotomies about the bilateral L3 and L4 nerve roots completing the decompression.  We now turned our attention to the posterior lumbar interbody fusion. I used a scalpel to incise the intervertebral disc at L3-4. I then performed a partial intervertebral discectomy at L3-4 using the pituitary forceps. We prepared the vertebral endplates at L3-4 for the fusion by removing the soft tissues with the curettes. We then used the trial spacers to pick the appropriate sized interbody prosthesis. We prefilled his prosthesis with a combination of local morselized autograft bone that we obtained during the decompression as well as Actifuse bone graft extender. We inserted the prefilled prosthesis into the interspace at L3-4. There was a good snug fit of the prosthesis in the interspace. We then filled and the remainder of the intervertebral disc space with local morselized autograft bone and Actifuse. This completed the posterior lumbar interbody arthrodesis.  We now turned attention to the instrumentation. Under fluoroscopic guidance we cannulated the bilateral L3 pedicles with the bone probe. We then removed the bone probe. We then tapped the  pedicle with a 5.5  millimeter tap. We then removed the tap. We probed inside the tapped pedicle with a ball probe to rule out cortical breaches. We then inserted a 6.5 x 50 millimeter pedicle screw into the L3 pedicles bilaterally under fluoroscopic guidance. We then palpated along the medial aspect of the pedicles to rule out cortical breaches. There were none. The nerve roots were not injured. I then removed the capsule and the old pedicle screws at L4 and L5 and then removed the rods. We then connected the unilateral pedicle screws from L3-L5 with a lordotic rod. We compressed the construct and secured the rod in place with the caps. We then tightened the caps appropriately. This completed the instrumentation from L3-L5.  We now turned our attention to the posterior lateral arthrodesis at L3-4. We used the high-speed drill to decorticate the remainder of the facets, pars, transverse process at L3-4. We then applied a combination of local morselized autograft bone and Vitoss bone graft extender over these decorticated posterior lateral structures. This completed the posterior lateral arthrodesis.  We then obtained hemostasis using bipolar electrocautery. We irrigated the wound out with bacitracin solution. We inspected the thecal sac and nerve roots and noted they were well decompressed. We then removed the retractor. I placed a medium Hemovac drain in the epidural space and tunneled out through separate stab wound. We reapproximated patient's thoracolumbar fascia with interrupted #1 Vicryl suture. We reapproximated patient's subcutaneous tissue with interrupted 2-0 Vicryl suture. The reapproximated patient's skin with Steri-Strips and benzoin. The wound was then coated with bacitracin ointment. A sterile dressing was applied. The drapes were removed. The patient was subsequently returned to the supine position where they were extubated by the anesthesia team. He was then transported to the post anesthesia care unit in stable  condition. All sponge instrument and needle counts were reportedly correct at the end of this case.

## 2012-02-08 NOTE — Progress Notes (Signed)
Pt educated with teach back method about PCA pt has had one in the past was knowlegable

## 2012-02-08 NOTE — Progress Notes (Signed)
UR COMPLETED  

## 2012-02-08 NOTE — Clinical Social Work Note (Signed)
Clinical Social Work   CSW received consult for SNF. CSW reviewed chart. Awaiting PT evals for discharge recommendations. CSW will assess for SNF, if appropriate. Please call with any urgent needs. CSW will continue to follow.   Francine Hannan, MSW, LCSWA #[phone] 

## 2012-02-08 NOTE — H&P (Signed)
Subjective: The patient is a 72 year old white female who I previously performed an L4-5 decompression, each patient, and fusion on years ago. The patient has developed recurrent back buttocks and leg pain consistent with neurogenic claudication. She has failed medical management and was worked up with a lumbar MRI. This demonstrated the patient had severe spinal stenosis at L3-4, and some more mild spinal stenosis at L2-3. I discussed the various treatment option with the patient including surgery. The patient has weighed the risks, benefits, and alternatives surgery and decided proceed with a L3-4 decompression, each patient and fusion.   Past Medical History  Diagnosis Date  . Hypertension   . Arthritis     psoriatic--Dr.Zieminski  . Osteopenia     Dr.John McComb  . Hyperglycemia     Past Surgical History  Procedure Date  . Total abdominal hysterectomy w/ bilateral salpingoophorectomy     For Fibroids  . Tubal ligation   . Cataract extraction, bilateral   . Back surgery 09/2010    DISC REMOVED AND SPACER/FUSION PLACED   . Abdominal hysterectomy     No Known Allergies  History  Substance Use Topics  . Smoking status: Never Smoker   . Smokeless tobacco: Never Used  . Alcohol Use: No    Family History  Problem Relation Age of Onset  . Kidney cancer Father   . Hypertension Mother   . Autoimmune disease Mother     Hemolytic Anemia  . Breast cancer Maternal Aunt   . Hypertension Maternal Aunt   . Stroke Maternal Grandfather    Prior to Admission medications   Medication Sig Start Date End Date Taking? Authorizing Provider  Ascorbic Acid (VITAMIN C PO) Take 1 tablet by mouth daily.   Yes Historical Provider, MD  Calcium Carbonate-Vitamin D (CALCIUM 600 + D PO) Take 1 tablet by mouth daily.    Yes Historical Provider, MD  Cholecalciferol (VITAMIN D) 2000 UNITS CAPS Take 1 capsule by mouth daily.    Yes Historical Provider, MD  folic acid (FOLVITE) 1 MG tablet Take 1 mg by  mouth daily.     Yes Historical Provider, MD  hydrochlorothiazide (HYDRODIURIL) 25 MG tablet Take 12.5 mg by mouth daily.   Yes Historical Provider, MD  metoprolol succinate (TOPROL-XL) 50 MG 24 hr tablet Take 50 mg by mouth daily. Take with or immediately following a meal.   Yes Historical Provider, MD  Multiple Vitamin (MULTIVITAMIN) tablet Take 1 tablet by mouth daily.   Yes Historical Provider, MD  methotrexate (RHEUMATREX) 2.5 MG tablet Take 15 mg by mouth once a week. Caution:Chemotherapy. Protect from light. TAKES ON SAT    Historical Provider, MD     Review of Systems  Positive ROS: As above  All other systems have been reviewed and were otherwise negative with the exception of those mentioned in the HPI and as above.  Objective: Vital signs in last 24 hours: Temp:  [98 F (36.7 C)] 98 F (36.7 C) (01/06 0733) Pulse Rate:  [74] 74  (01/06 0733) Resp:  [18] 18  (01/06 0733) BP: (178-179)/(83-104) 178/83 mmHg (01/06 0824) SpO2:  [97 %] 97 % (01/06 0733)  General Appearance: Alert, cooperative, no distress, appears stated age Head: Normocephalic, without obvious abnormality, atraumatic Eyes: PERRL, conjunctiva/corneas clear, EOM's intact, fundi benign, both eyes      Ears: Normal TM's and external ear canals, both ears Throat: Lips, mucosa, and tongue normal; teeth and gums normal Neck: Supple, symmetrical, trachea midline, no adenopathy; thyroid: No enlargement/tenderness/nodules;  no carotid bruit or JVD Back: Symmetric, no curvature, ROM normal, no CVA tenderness. The patient's lumbar incision is well-healed. Lungs: Clear to auscultation bilaterally, respirations unlabored Heart: Regular rate and rhythm, S1 and S2 normal, no murmur, rub or gallop Abdomen: Soft, non-tender, bowel sounds active all four quadrants, no masses, no organomegaly Extremities: Extremities normal, atraumatic, no cyanosis or edema Pulses: 2+ and symmetric all extremities Skin: Skin color, texture,  turgor normal, no rashes or lesions  NEUROLOGIC:   Mental status: alert and oriented, no aphasia, good attention span, Fund of knowledge/ memory ok Motor Exam - grossly normal Sensory Exam - grossly normal Reflexes:  Coordination - grossly normal Gait - grossly normal Balance - grossly normal Cranial Nerves: I: smell Not tested  II: visual acuity  OS: Normal    OD: Normal   II: visual fields Full to confrontation  II: pupils Equal, round, reactive to light  III,VII: ptosis None  III,IV,VI: extraocular muscles  Full ROM  V: mastication Normal  V: facial light touch sensation  Normal  V,VII: corneal reflex  Present  VII: facial muscle function - upper  Normal  VII: facial muscle function - lower Normal  VIII: hearing Not tested  IX: soft palate elevation  Normal  IX,X: gag reflex Present  XI: trapezius strength  5/5  XI: sternocleidomastoid strength 5/5  XI: neck flexion strength  5/5  XII: tongue strength  Normal    Data Review Lab Results  Component Value Date   WBC 10.2 01/29/2012   HGB 14.0 01/29/2012   HCT 41.2 01/29/2012   MCV 90.9 01/29/2012   PLT 345 01/29/2012   Lab Results  Component Value Date   NA 140 01/29/2012   K 3.9 01/29/2012   CL 100 01/29/2012   CO2 30 01/29/2012   BUN 16 01/29/2012   CREATININE 0.69 01/29/2012   GLUCOSE 90 01/29/2012   No results found for this basename: INR, PROTIME    Assessment/Plan: 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 I reviewed her MR scan with her and pointed out the abnormalities. We have discussed the various treatment options including surgery. I have described the surgical option of a L3-4 decompression, each patient, and fusion. We also discussed the possibility of laminotomies at L2-3. I described the surgery to her. I have shown her surgical models. We have discussed the risks, benefits, alternatives, and likelihood of achieving our  goals with surgery. I have answered all the patient's questions. She has decided to proceed with surgery.   Kendahl Bumgardner D 02/08/2012 9:23 AM

## 2012-02-08 NOTE — Anesthesia Preprocedure Evaluation (Addendum)
Anesthesia Evaluation  Patient identified by MRN, date of birth, ID band Patient awake    Reviewed: Allergy & Precautions, H&P , NPO status , Patient's Chart, lab work & pertinent test results  Airway Mallampati: I TM Distance: >3 FB Neck ROM: Full    Dental   Pulmonary  01-29-12 CHEST - 2 VIEW   Comparison: 04/20/2011   Findings: Lungs are essentially clear.  No focal consolidation.  No pleural effusion or pneumothorax.   The heart is normal in size.   Degenerative changes of the visualized thoracolumbar spine.   IMPRESSION: No evidence of acute cardiopulmonary disease.              Cardiovascular hypertension, Pt. on medications     Neuro/Psych    GI/Hepatic   Endo/Other    Renal/GU      Musculoskeletal   Abdominal   Peds  Hematology   Anesthesia Other Findings   Reproductive/Obstetrics                          Anesthesia Physical Anesthesia Plan  ASA: II  Anesthesia Plan: General   Post-op Pain Management:    Induction: Intravenous  Airway Management Planned: Oral ETT  Additional Equipment:   Intra-op Plan:   Post-operative Plan: Extubation in OR  Informed Consent: I have reviewed the patients History and Physical, chart, labs and discussed the procedure including the risks, benefits and alternatives for the proposed anesthesia with the patient or authorized representative who has indicated his/her understanding and acceptance.     Plan Discussed with: CRNA and Surgeon  Anesthesia Plan Comments:         Anesthesia Quick Evaluation

## 2012-02-08 NOTE — Progress Notes (Signed)
Subjective:  The patient is alert and pleasant. She has no complaints. She looks well.  Objective: Vital signs in last 24 hours: Temp:  [97.4 F (36.3 C)-98 F (36.7 C)] 97.4 F (36.3 C) (01/06 1300) Pulse Rate:  [74] 74  (01/06 0733) Resp:  [18] 18  (01/06 0733) BP: (178-179)/(83-104) 178/83 mmHg (01/06 0824) SpO2:  [97 %] 97 % (01/06 0733)  Intake/Output from previous day:   Intake/Output this shift: Total I/O In: 1700 [I.V.:1700] Out: 775 [Urine:575; Blood:200]  Physical exam the patient is moving all 4 extremities well.  Lab Results: No results found for this basename: WBC:2,HGB:2,HCT:2,PLT:2 in the last 72 hours BMET No results found for this basename: NA:2,K:2,CL:2,CO2:2,GLUCOSE:2,BUN:2,CREATININE:2,CALCIUM:2 in the last 72 hours  Studies/Results: No results found.  Assessment/Plan: The patient is doing well.  LOS: 0 days     Larose Batres D 02/08/2012, 1:17 PM

## 2012-02-08 NOTE — Progress Notes (Signed)
Positioned on left side with pillows.

## 2012-02-08 NOTE — Anesthesia Procedure Notes (Addendum)
Procedure Name: Intubation Date/Time: 02/08/2012 9:34 AM Performed by: Elon Alas Pre-anesthesia Checklist: Patient identified, Timeout performed, Emergency Drugs available, Suction available and Patient being monitored Patient Re-evaluated:Patient Re-evaluated prior to inductionOxygen Delivery Method: Circle system utilized Preoxygenation: Pre-oxygenation with 100% oxygen Intubation Type: IV induction Ventilation: Mask ventilation without difficulty Laryngoscope Size: Mac and 3 Grade View: Grade III Tube type: Oral Tube size: 7.0 mm Number of attempts: 1 Airway Equipment and Method: Stylet Placement Confirmation: positive ETCO2,  ETT inserted through vocal cords under direct vision and breath sounds checked- equal and bilateral Secured at: 21 cm Tube secured with: Tape Dental Injury: Teeth and Oropharynx as per pre-operative assessment

## 2012-02-08 NOTE — Progress Notes (Signed)
Pt received from [place] R N alert and oriented moves all extremities TC pain 3/10

## 2012-02-08 NOTE — Anesthesia Postprocedure Evaluation (Signed)
Anesthesia Post Note  Patient: Michelle [name]  Procedure(s) Performed: Procedure(s) (LRB): POSTERIOR LUMBAR FUSION 1 LEVEL (N/A)  Anesthesia type: general  Patient location: PACU  Post pain: Pain level controlled  Post assessment: Patient's Cardiovascular Status Stable  Last Vitals:  Filed Vitals:   02/08/12 1411  BP:   Pulse: 55  Temp:   Resp: 16    Post vital signs: Reviewed and stable  Level of consciousness: sedated  Complications: No apparent anesthesia complications

## 2012-02-08 NOTE — Preoperative (Signed)
Beta Blockers  Toprol taken on 02-08-12 @

## 2012-02-09 LAB — CBC
Hemoglobin: 12.3 g/dL (ref 12.0–15.0)
MCH: 30.6 pg (ref 26.0–34.0)
MCV: 90 fL (ref 78.0–100.0)
Platelets: 282 10*3/uL (ref 150–400)
RBC: 4.02 MIL/uL (ref 3.87–5.11)
WBC: 13.9 10*3/uL — ABNORMAL HIGH (ref 4.0–10.5)

## 2012-02-09 LAB — BASIC METABOLIC PANEL
CO2: 28 mEq/L (ref 19–32)
Chloride: 103 mEq/L (ref 96–112)
Glucose, Bld: 108 mg/dL — ABNORMAL HIGH (ref 70–99)
Sodium: 141 mEq/L (ref 135–145)

## 2012-02-09 NOTE — Progress Notes (Signed)
Occupational Therapy Treatment Patient Details Name: Michelle [name] MRN: [id number] DOB: Nov 12, 1940 Today's Date: 02/09/2012 Time: 4098-1191 OT Time Calculation (min): 25 min  OT Assessment / Plan / Recommendation Comments on Treatment Session pt did well with AE    Follow Up Recommendations  No OT follow up       Equipment Recommendations  None recommended by OT       Frequency Min 3X/week   Plan Discharge plan remains appropriate    Precautions / Restrictions Precautions Precautions: Back Precaution Booklet Issued: Yes (comment) Required Braces or Orthoses: Spinal Brace Spinal Brace: Lumbar corset       ADL  Grooming: Performed;Wash/dry hands;Teeth care;Brushing hair;Minimal assistance Where Assessed - Grooming: Unsupported standing Upper Body Bathing: Performed;Minimal assistance Where Assessed - Upper Body Bathing: Unsupported sitting Lower Body Bathing: Performed;Moderate assistance Where Assessed - Lower Body Bathing: Unsupported sit to stand Upper Body Dressing: Performed;Minimal assistance Where Assessed - Upper Body Dressing: Unsupported sitting Lower Body Dressing: Performed;Minimal assistance;Other (comment) (AE education) Where Assessed - Lower Body Dressing: Unsupported sitting;Unsupported sit to stand Toilet Transfer: Performed;Minimal assistance Toilet Transfer Method: Sit to stand Toilet Transfer Equipment: Comfort height toilet Toileting - Clothing Manipulation and Hygiene: Performed;Minimal assistance Where Assessed - Toileting Clothing Manipulation and Hygiene: Standing Transfers/Ambulation Related to ADLs: OT session focused on LB dressing while adhering to back precautions.  Educated pt in use of reacher and sock aid and where to obtain.  Daugther will obtain for pt    OT Diagnosis: Generalized weakness;Acute pain  OT Problem List: Decreased strength;Increased edema;Decreased knowledge of precautions OT Treatment Interventions: Self-care/ADL  training;DME and/or AE instruction;Patient/family education   OT Goals Acute Rehab OT Goals OT Goal Formulation: With patient Time For Goal Achievement: 02/23/12 ADL Goals Pt Will Perform Lower Body Dressing: with modified independence;with adaptive equipment;Sit to stand from chair ADL Goal: Lower Body Dressing - Progress: Progressing toward goals Pt Will Transfer to Toilet: with modified independence;Comfort height toilet;Maintaining back safety precautions ADL Goal: Toilet Transfer - Progress: Goal set today Pt Will Perform Tub/Shower Transfer: Shower transfer;with modified independence;Maintaining back safety precautions ADL Goal: Tub/Shower Transfer - Progress: Goal set today  Visit Information  Last OT Received On: 02/09/12 Assistance Needed: +1    Subjective Data      Prior Functioning  Home Living Lives With: Alone Available Help at Discharge: Family (daughter available 24/7 for inital few days) Type of Home: House Home Access: Stairs to enter [employer] of Steps: 2 Entrance Stairs-Rails: None Home Layout: Two level;Able to live on main level with bedroom/bathroom Bathroom Shower/Tub: Engineer, manufacturing systems: Standard Home Adaptive Equipment: Bedside commode/3-in-1 Additional Comments: broken walker will need new one Prior Function Level of Independence: Independent Able to Take Stairs?: Yes Driving: Yes Vocation: Retired Comments: Education officer, 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) Dominant Hand:  (both)    Cognition  Overall Cognitive Status: Appears within functional limits for tasks assessed/performed Arousal/Alertness: Awake/alert Orientation Level: Oriented X4 / Intact;Appears intact for tasks assessed Behavior During Session: [facility] for tasks performed    Mobility  Shoulder Instructions Bed Mobility Bed Mobility: Rolling Right;Sit to Sidelying Left;Scooting to [facility] Rolling Right: 5: Supervision Sit to  Sidelying Left: 5: Supervision Scooting to [facility]: 4: Min assist Details for Bed Mobility Assistance: good technique and awareness of precautions Transfers Transfers: Sit to Stand;Stand to Sit Sit to Stand: 5: Supervision;With upper extremity assist Stand to Sit: 5: Supervision;With upper extremity assist Details for Transfer Assistance: verbal cues needed for hand placement  Balance Balance Balance Assessed: Yes High Level Balance High Level Balance Activites: Side stepping;Backward walking;Sudden stops;Head turns;Turns;Direction changes High Level Balance Comments: pt steady with activities   End of Session OT - End of Session Equipment Utilized During Treatment: Back brace Activity Tolerance: Patient tolerated treatment well Patient left: Other (comment) (with PT)  GO     Doil Kamara, [place]ung 02/09/2012, 3:31 PM

## 2012-02-09 NOTE — Evaluation (Signed)
Physical Therapy Evaluation Patient Details Name: Michelle [name] MRN: [id number] DOB: 1940/11/24 Today's Date: 02/09/2012 Time: 4098-1191 PT Time Calculation (min): 30 min  PT Assessment / Plan / Recommendation Clinical Impression  Pt is a pleasent 72 y.o. female s/p lumbar sx.  Patient presents with some deficits in funcitonal mobility secondary to pain and decreased activity tolerence.  Pt will benefit from skilled PT to address deficits and maximize independence.    PT Assessment  Patient needs continued PT services    Follow Up Recommendations       Does the patient have the potential to tolerate intense rehabilitation      Barriers to Discharge None      Equipment Recommendations  Rolling walker with 5" wheels (may need junior walker for height)    Recommendations for Other Services     Frequency Min 5X/week    Precautions / Restrictions Precautions Precautions: Back Precaution Booklet Issued: Yes (comment) Required Braces or Orthoses: Spinal Brace Spinal Brace: Lumbar corset   Pertinent Vitals/Pain 3/10      Mobility  Bed Mobility Bed Mobility: Not assessed Transfers Transfers: Sit to Stand;Stand to Sit Sit to Stand: 4: Min guard Stand to Sit: 4: Min guard Details for Transfer Assistance: VC's for hand placement Ambulation/Gait Ambulation/Gait Assistance: 5: Supervision Ambulation Distance (Feet): 60 Feet Assistive device: Rolling walker Ambulation/Gait Assistance Details: Pt steady with ambulation VC's for upright posture Gait Pattern: Step-through pattern;Decreased stride length;Narrow base of support Gait velocity: decreased General Gait Details: Pt steady with ambulation and minimal pain Stairs: No           PT Diagnosis: Difficulty walking;Abnormality of gait;Generalized weakness;Acute pain  PT Problem List: Decreased strength;Decreased range of motion;Decreased activity tolerance;Decreased balance;Decreased mobility;Pain PT Treatment  Interventions: DME instruction;Gait training;Therapeutic activities;Therapeutic exercise;Functional mobility training;Stair training;Patient/family education   PT Goals Acute Rehab PT Goals PT Goal Formulation: With patient Time For Goal Achievement: 02/16/12 Potential to Achieve Goals: Good Pt will go Sit to Stand: with modified independence PT Goal: Sit to Stand - Progress: Goal set today Pt will go Stand to Sit: with modified independence PT Goal: Stand to Sit - Progress: Goal set today Pt will Ambulate: >150 feet;with modified independence PT Goal: Ambulate - Progress: Goal set today Pt will Go Up / Down Stairs: 1-2 stairs;with modified independence PT Goal: Up/Down Stairs - Progress: Goal set today  Visit Information  Last PT Received On: 02/09/12 Assistance Needed: +1    Subjective Data  Subjective: i am just finishing up my bath Patient Stated Goal: to go home   Prior Functioning  Home Living Lives With: Alone Available Help at Discharge: Family (daughter available 24/7 for inital few days) Type of Home: House Home Access: Stairs to enter [employer] of Steps: 2 Entrance Stairs-Rails: None Home Layout: Two level;Able to live on main level with bedroom/bathroom Bathroom Shower/Tub: Engineer, manufacturing systems: Standard Home Adaptive Equipment: Bedside commode/3-in-1 Additional Comments: broken walker will need new one Prior Function Level of Independence: Independent Able to Take Stairs?: Yes Driving: Yes Vocation: Retired Comments: Education officer, 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) Dominant Hand:  (both)    Cognition  Overall Cognitive Status: Appears within functional limits for tasks assessed/performed Arousal/Alertness: Awake/alert Orientation Level: Oriented X4 / Intact;Appears intact for tasks assessed Behavior During Session: [facility] for tasks performed    Extremity/Trunk Assessment Right Upper Extremity  Assessment RUE ROM/Strength/Tone: [facility] for tasks assessed RUE Sensation: WFL - Light Touch;WFL - Proprioception RUE Coordination: WFL - gross/fine motor  Left Upper Extremity Assessment LUE ROM/Strength/Tone: WFL for tasks assessed LUE Sensation: WFL - Light Touch;WFL - Proprioception LUE Coordination: WFL - gross/fine motor Right Lower Extremity Assessment RLE ROM/Strength/Tone: WFL for tasks assessed Left Lower Extremity Assessment LLE ROM/Strength/Tone: WFL for tasks assessed   Balance Balance Balance Assessed: Yes High Level Balance High Level Balance Activites: Side stepping;Backward walking;Sudden stops;Head turns;Turns;Direction changes High Level Balance Comments: pt steady with activities  End of Session PT - End of Session Equipment Utilized During Treatment: Gait belt;Back brace Activity Tolerance: Patient tolerated treatment well Patient left: in chair;with call bell/phone within reach;with family/visitor present Nurse Communication: Mobility status  GP     Fabio Asa 02/09/2012, 2:41 PM  Charlotte Crumb, PT DPT  [phone]

## 2012-02-09 NOTE — Evaluation (Signed)
Occupational Therapy Evaluation Patient Details Name: Michelle [name] MRN: [id number] DOB: 1940/03/01 Today's Date: 02/09/2012 Time: 4098-1191 OT Time Calculation (min): 34 min  OT Assessment / Plan / Recommendation Clinical Impression  Pt presents to OT s/p lumbar fusion. Pt with decreased I with ADL activity. Pt will benefit from skilled OT to increase I with ADL activity and return to PLOF    OT Assessment  Patient needs continued OT Services    Follow Up Recommendations  No OT follow up       Equipment Recommendations  None recommended by OT       Frequency  Min 3X/week    Precautions / Restrictions Precautions Precautions: Back Precaution Booklet Issued: Yes (comment) Required Braces or Orthoses: Spinal Brace Spinal Brace: Lumbar corset       ADL  Grooming: Performed;Wash/dry hands;Teeth care;Brushing hair;Minimal assistance Where Assessed - Grooming: Unsupported standing Upper Body Bathing: Performed;Minimal assistance Where Assessed - Upper Body Bathing: Unsupported sitting Lower Body Bathing: Performed;Moderate assistance Where Assessed - Lower Body Bathing: Unsupported sit to stand Upper Body Dressing: Performed;Minimal assistance Where Assessed - Upper Body Dressing: Unsupported sitting Lower Body Dressing: Performed;Moderate assistance Where Assessed - Lower Body Dressing: Unsupported sit to stand Toilet Transfer: Performed;Minimal assistance Toilet Transfer Method: Sit to stand Toilet Transfer Equipment: Comfort height toilet Toileting - Clothing Manipulation and Hygiene: Performed;Minimal assistance Where Assessed - Toileting Clothing Manipulation and Hygiene: Standing    OT Diagnosis: Generalized weakness;Acute pain  OT Problem List: Decreased strength;Increased edema;Decreased knowledge of precautions OT Treatment Interventions: Self-care/ADL training;DME and/or AE instruction;Patient/family education   OT Goals Acute Rehab OT Goals OT Goal  Formulation: With patient Time For Goal Achievement: 02/23/12 ADL Goals Pt Will Perform Lower Body Dressing: with modified independence;with adaptive equipment;Sit to stand from chair ADL Goal: Lower Body Dressing - Progress: Goal set today Pt Will Transfer to Toilet: with modified independence;Comfort height toilet;Maintaining back safety precautions ADL Goal: Toilet Transfer - Progress: Goal set today Pt Will Perform Tub/Shower Transfer: Shower transfer;with modified independence;Maintaining back safety precautions ADL Goal: Tub/Shower Transfer - Progress: Goal set today  Visit Information  Last OT Received On: 02/09/12 Assistance Needed: +1       Prior Functioning     Home Living Lives With: Alone Available Help at Discharge: Family (daughter available 24/7 for inital few days) Type of Home: House Home Access: Stairs to enter [employer] of Steps: 2 Entrance Stairs-Rails: None Home Layout: Two level;Able to live on main level with bedroom/bathroom Bathroom Shower/Tub: Engineer, manufacturing systems: Standard Home Adaptive Equipment: Bedside commode/3-in-1 Additional Comments: broken walker will need new one Prior Function Level of Independence: Independent Able to Take Stairs?: Yes Driving: Yes Vocation: Retired Comments: Education officer, 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) Dominant Hand:  (both)            Cognition  Overall Cognitive Status: Appears within functional limits for tasks assessed/performed Arousal/Alertness: Awake/alert Orientation Level: Oriented X4 / Intact;Appears intact for tasks assessed Behavior During Session: [facility] for tasks performed    Extremity/Trunk Assessment Right Upper Extremity Assessment RUE ROM/Strength/Tone: [facility] for tasks assessed RUE Sensation: WFL - Light Touch;WFL - Proprioception RUE Coordination: WFL - gross/fine motor Left Upper Extremity Assessment LUE ROM/Strength/Tone: WFL for tasks  assessed LUE Sensation: WFL - Light Touch;WFL - Proprioception LUE Coordination: WFL - gross/fine motor Right Lower Extremity Assessment RLE ROM/Strength/Tone: WFL for tasks assessed Left Lower Extremity Assessment LLE ROM/Strength/Tone: [facility] for tasks assessed     Mobility Bed Mobility Bed Mobility:  Not assessed Transfers Sit to Stand: 4: Min guard Stand to Sit: 4: Min guard Details for Transfer Assistance: VC's for hand placement              End of Session OT - End of Session Equipment Utilized During Treatment: Back brace Activity Tolerance: Patient tolerated treatment well Patient left: in chair;with call bell/phone within reach;with family/visitor present  GO     Alba Cory 02/09/2012, 12:31 PM

## 2012-02-09 NOTE — Progress Notes (Signed)
Patient ID: Michelle [name], female   DOB: 1940-12-15, 72 y.o.   MRN: [id number] Subjective:  The patient is alert and pleasant. She looks well. Her back is appropriately sore.  Objective: Vital signs in last 24 hours: Temp:  [97.2 F (36.2 C)-98.2 F (36.8 C)] 97.9 F (36.6 C) (01/07 0600) Pulse Rate:  [52-70] 66  (01/07 0600) Resp:  [12-48] 16  (01/07 0600) BP: (121-178)/(49-83) 123/60 mmHg (01/07 0600) SpO2:  [93 %-100 %] 98 % (01/07 0600) FiO2 (%):  [56 %] 56 % (01/06 1658) Weight:  [72.576 kg (160 lb)] 72.576 kg (160 lb) (01/06 1700)  Intake/Output from previous day: 01/06 0701 - 01/07 0700 In: 2575 [I.V.:2575] Out: 1650 [Urine:1250; Drains:200; Blood:200] Intake/Output this shift:    Physical exam patient is moving her lower extremities well.  Lab Results:  Basename 02/09/12 0558  WBC 13.9*  HGB 12.3  HCT 36.2  PLT 282   BMET No results found for this basename: NA:2,K:2,CL:2,CO2:2,GLUCOSE:2,BUN:2,CREATININE:2,CALCIUM:2 in the last 72 hours  Studies/Results: Dg Lumbar Spine 2-3 Views  02/08/2012  *RADIOLOGY REPORT*  Clinical Data: Fusion.  DG C-ARM 1-60 MIN,LUMBAR SPINE - 2-3 VIEW  Technique: Two intraoperative C-arm views submitted for review after surgery.  Comparison:  08/14/2011 plain film exam.  Findings: Last fully open disc space labeled L5-S1.  Remote fusion L4-5 with bilateral pedicle screws, posterior connecting bar and interbody spacer/fusion.  New pedicle screws at the L3 level bilaterally.  Interbody spacer L3-4 level.  No complication noted.  This can be assessed on follow- up.  IMPRESSION: Extension of fusion to the L3 level.   Original Report Authenticated By: Lacy Duverney, M.D.    Dg C-arm 1-60 Min  02/08/2012  *RADIOLOGY REPORT*  Clinical Data: Fusion.  DG C-ARM 1-60 MIN,LUMBAR SPINE - 2-3 VIEW  Technique: Two intraoperative C-arm views submitted for review after surgery.  Comparison:  08/14/2011 plain film exam.  Findings: Last fully open disc space  labeled L5-S1.  Remote fusion L4-5 with bilateral pedicle screws, posterior connecting bar and interbody spacer/fusion.  New pedicle screws at the L3 level bilaterally.  Interbody spacer L3-4 level.  No complication noted.  This can be assessed on follow- up.  IMPRESSION: Extension of fusion to the L3 level.   Original Report Authenticated By: Lacy Duverney, M.D.     Assessment/Plan: Postop day 1: We will mobilize the patient with PT and OT. We will discontinue her Foley catheter. I will plan to continue the PCA until tomorrow.  LOS: 1 day     Khy Pitre D 02/09/2012, 7:39 AM

## 2012-02-09 NOTE — Progress Notes (Signed)
Physical Therapy Treatment Patient Details Name: Michelle [name] MRN: [id number] DOB: 1940/08/13 Today's Date: 02/09/2012 Time: 1324-4010 PT Time Calculation (min): 18 min  PT Assessment / Plan / Recommendation Comments on Treatment Session  Pt making steady progress at this time. Able to ambulate increased distance and tolerating activity well. Feel patient will be safe for d/c home after 1 more session.          Barriers to Discharge None      Equipment Recommendations  Rolling walker with 5" wheels (may need junior walker for height)    Recommendations for Other Services    Frequency Min 5X/week   Plan Discharge plan remains appropriate    Precautions / Restrictions Precautions Precautions: Back Precaution Booklet Issued: Yes (comment) Required Braces or Orthoses: Spinal Brace Spinal Brace: Lumbar corset   Pertinent Vitals/Pain 2/10    Mobility  Bed Mobility Bed Mobility: Rolling Right;Sit to Sidelying Left;Scooting to HOB Rolling Right: 5: Supervision Sit to Sidelying Left: 5: Supervision Scooting to [facility]: 4: Min assist Details for Bed Mobility Assistance: good technique and awareness of precautions Transfers Transfers: Sit to Stand;Stand to Sit Sit to Stand: 5: Supervision;With upper extremity assist Stand to Sit: 5: Supervision;With upper extremity assist Details for Transfer Assistance: verbal cues needed for hand placement Ambulation/Gait Ambulation/Gait Assistance: 5: Supervision Ambulation Distance (Feet): 200 Feet Assistive device: Rolling walker Ambulation/Gait Assistance Details: Pt steady with ambulation VC's for upright posture Gait Pattern: Step-through pattern;Decreased stride length;Narrow base of support Gait velocity: decreased General Gait Details: Pt steady with ambulation and minimal pain Stairs: No        PT Diagnosis: Difficulty walking;Abnormality of gait;Generalized weakness;Acute pain  PT Problem List: Decreased strength;Decreased range  of motion;Decreased activity tolerance;Decreased balance;Decreased mobility;Pain PT Treatment Interventions: DME instruction;Gait training;Therapeutic activities;Therapeutic exercise;Functional mobility training;Stair training;Patient/family education   PT Goals Acute Rehab PT Goals PT Goal Formulation: With patient Time For Goal Achievement: 02/16/12 Potential to Achieve Goals: Good Pt will go Sit to Stand: with modified independence PT Goal: Sit to Stand - Progress: Progressing toward goal Pt will go Stand to Sit: with modified independence PT Goal: Stand to Sit - Progress: Progressing toward goal Pt will Ambulate: >150 feet;with modified independence PT Goal: Ambulate - Progress: Progressing toward goal Pt will Go Up / Down Stairs: 1-2 stairs;with modified independence PT Goal: Up/Down Stairs - Progress: Goal set today  Visit Information  Last PT Received On: 02/09/12 Assistance Needed: +1    Subjective Data  Subjective: I am so thankful you could come back Patient Stated Goal: to go home   Cognition  Overall Cognitive Status: Appears within functional limits for tasks assessed/performed Arousal/Alertness: Awake/alert Orientation Level: Oriented X4 / Intact;Appears intact for tasks assessed Behavior During Session: [facility] for tasks performed    Balance  Balance Balance Assessed: Yes High Level Balance High Level Balance Activites: Side stepping;Backward walking;Sudden stops;Head turns;Turns;Direction changes High Level Balance Comments: pt steady with activities  End of Session PT - End of Session Equipment Utilized During Treatment: Gait belt;Back brace Activity Tolerance: Patient tolerated treatment well Patient left: in bed;with call bell/phone within reach;with family/visitor present Nurse Communication: Mobility status   GP     Fabio Asa 02/09/2012, 3:30 PM Charlotte Crumb, PT DPT  [phone]

## 2012-02-10 MED ORDER — SENNA 8.6 MG PO TABS
1.0000 | ORAL_TABLET | Freq: Every evening | ORAL | Status: DC | PRN
  Administered 2012-02-10: 8.6 mg via ORAL
  Filled 2012-02-10: qty 1

## 2012-02-10 NOTE — Progress Notes (Signed)
Physical Therapy Treatment Patient Details Name: Michelle [name] MRN: [id number] DOB: 06-16-1940 Today's Date: 02/10/2012 Time: 1000-1030 PT Time Calculation (min): 30 min  PT Assessment / Plan / Recommendation Comments on Treatment Session  Continuing to progress well. Tolerated increased activity (distance, stairs) well. Pt states she will possibly d/c on tomorrow???.. Encouraged pt to ambulate with nursing again today.     Follow Up Recommendations  No PT follow up     Does the patient have the potential to tolerate intense rehabilitation     Barriers to Discharge        Equipment Recommendations  Rolling walker with 5" wheels (may need junior height)    Recommendations for Other Services    Frequency Min 5X/week   Plan Discharge plan remains appropriate    Precautions / Restrictions Precautions Precautions: Back Required Braces or Orthoses: Spinal Brace Spinal Brace: Lumbar corset;Applied in sitting position Restrictions Weight Bearing Restrictions: No   Pertinent Vitals/Pain No c/o pain    Mobility  Bed Mobility Bed Mobility: Rolling Right;Right Sidelying to Sit;Sit to Sidelying Right Rolling Right: 6: Modified independent (Device/Increase time) Right Sidelying to Sit: 6: Modified independent (Device/Increase time) Sit to Sidelying Right: 5: Supervision Details for Bed Mobility Assistance: Good use of logroll technique.  Transfers Transfers: Sit to Stand;Stand to Sit Sit to Stand: From bed;From toilet;5: Supervision Stand to Sit: To bed;To toilet;5: Supervision Details for Transfer Assistance: VCS safety, hand placement Ambulation/Gait Ambulation/Gait Assistance: 5: Supervision Ambulation Distance (Feet): 500 Feet Assistive device: Rolling walker Ambulation/Gait Assistance Details: Slow steady pace. No c/o of increased pain with increased distance Gait Pattern: Step-through pattern;Decreased stride length Stairs: Yes Stairs Assistance: 4: Min assist Stairs  Assistance Details (indicate cue type and reason): VCs safety, technique. 1 HHA.  Stair Management Technique: Forwards;No rails Number of Stairs: 2     Exercises     PT Diagnosis:    PT Problem List:   PT Treatment Interventions:     PT Goals Acute Rehab PT Goals Pt will go Sit to Stand: with modified independence PT Goal: Sit to Stand - Progress: Progressing toward goal Pt will go Stand to Sit: with modified independence PT Goal: Stand to Sit - Progress: Progressing toward goal Pt will Ambulate: >150 feet;with modified independence PT Goal: Ambulate - Progress: Progressing toward goal Pt will Go Up / Down Stairs: 1-2 stairs;with modified independence PT Goal: Up/Down Stairs - Progress: Progressing toward goal  Visit Information  Last PT Received On: 02/10/12 Assistance Needed: +1    Subjective Data  Subjective: "Its amazing how much better I feel" Patient Stated Goal: Home   Cognition  Overall Cognitive Status: Appears within functional limits for tasks assessed/performed Arousal/Alertness: Awake/alert Orientation Level: Appears intact for tasks assessed Behavior During Session: [facility] for tasks performed    Balance     End of Session PT - End of Session Equipment Utilized During Treatment: Back brace Activity Tolerance: Patient tolerated treatment well Patient left: in bed;with call bell/phone within reach   GP     [name] [place] 02/10/2012, 10:34 AM [phone]

## 2012-02-10 NOTE — Clinical Social Work Note (Signed)
Clinical Social Work   CSW reviewed chart and discussed pt with RN during progression. PT is recommending pt return home with DME. RNCM is aware and following. CSW signing off as no further needs identified.   Dede Query, MSW, Theresia Majors [phone]

## 2012-02-10 NOTE — Progress Notes (Signed)
Patient ID: Michelle [name], female   DOB: 25-May-1940, 72 y.o.   MRN: [id number] Subjective:  The patient is alert and pleasant. She looks well. She wants to go home tomorrow.  Objective: Vital signs in last 24 hours: Temp:  [97.2 F (36.2 C)-98.7 F (37.1 C)] 98.7 F (37.1 C) (01/08 1000) Pulse Rate:  [70-79] 75  (01/08 1000) Resp:  [15-19] 18  (01/08 1000) BP: (100-166)/(50-68) 134/57 mmHg (01/08 1000) SpO2:  [90 %-98 %] 95 % (01/08 1000)  Intake/Output from previous day: 01/07 0701 - 01/08 0700 In: 840 [P.O.:840] Out: 1635 [Urine:1550; Drains:85] Intake/Output this shift:    Physical exam the patient is alert and oriented. Her dressing is clean and dry. Her lower extremity strength is grossly normal.  Lab Results:  Basename 02/09/12 0558  WBC 13.9*  HGB 12.3  HCT 36.2  PLT 282   BMET  Basename 02/09/12 0558  NA 141  K 5.1  CL 103  CO2 28  GLUCOSE 108*  BUN 12  CREATININE 0.62  CALCIUM 9.2    Studies/Results: No results found.  Assessment/Plan: Postop day #2: The patient is doing well. I will discontinue her PCA pump and Hemovac drain. We will tentatively plan  to send her home tomorrow.  LOS: 2 days     Ellise Kovack D 02/10/2012, 1:42 PM

## 2012-02-11 MED ORDER — OXYCODONE-ACETAMINOPHEN 10-325 MG PO TABS: 1.0000 | ORAL_TABLET | ORAL | Status: DC | PRN

## 2012-02-11 MED FILL — Sodium Chloride IV Soln 0.9%: INTRAVENOUS | Qty: 1000 | Status: AC

## 2012-02-11 MED FILL — Heparin Sodium (Porcine) Inj 1000 Unit/ML: INTRAMUSCULAR | Qty: 30 | Status: AC

## 2012-02-11 NOTE — Progress Notes (Signed)
Physical Therapy Treatment Patient Details Name: Michelle [name] MRN: [id number] DOB: 08/13/1940 Today's Date: 02/11/2012 Time: 4098-1191 PT Time Calculation (min): 20 min  PT Assessment / Plan / Recommendation Comments on Treatment Session  Pt ready for d/c home from PT standpoint.    Follow Up Recommendations  No PT follow up     Does the patient have the potential to tolerate intense rehabilitation     Barriers to Discharge        Equipment Recommendations  Rolling walker with 5" wheels    Recommendations for Other Services    Frequency Min 5X/week   Plan Discharge plan remains appropriate    Precautions / Restrictions Precautions Precautions: Back Precaution Comments: Pt able to state 3/3 back precautions; required cues x 2 (for twisting, bending forward) Required Braces or Orthoses: Spinal Brace Spinal Brace: Lumbar corset;Applied in sitting position   Pertinent Vitals/Pain Denied pain! (pre-medicated for pain) Discussed use of pain medicine to allow normal daily activities; pt inquiring about positioning throughout the day and reviewed proper sitting posture and how to modify her sofa and recliner at home to support her spine    Mobility  Bed Mobility Bed Mobility: Sit to Sidelying Right;Rolling Left Rolling Left: 6: Modified independent (Device/Increase time) Sit to Sidelying Right: 6: Modified independent (Device/Increase time);HOB flat Details for Bed Mobility Assistance: no cues needed Transfers Transfers: Sit to Stand;Stand to Sit Sit to Stand: 6: Modified independent (Device/Increase time);With upper extremity assist;With armrests;From toilet Stand to Sit: 6: Modified independent (Device/Increase time);With upper extremity assist;To elevated surface;To bed;To toilet Ambulation/Gait Ambulation/Gait Assistance: 5: Supervision Ambulation Distance (Feet): 400 Feet Assistive device: Rolling walker Ambulation/Gait Assistance Details: vc x2 for keeping RW closer to  her body and upright posture (tends to flex when RW gets too far ahead) Gait Pattern: Step-through pattern;Decreased stride length Gait velocity: decreased Stairs: Yes Stairs Assistance: 4: Min assist Stairs Assistance Details (indicate cue type and reason): pt requested to repeat instruction; instructed in safe manner for daughter to assist her (palm to palm HHA); no cues needed for technique Stair Management Technique: Forwards;No rails;Other (comment) (Lt HHA) Number of Stairs: 2          PT Goals Acute Rehab PT Goals Pt will go Sit to Stand: with modified independence PT Goal: Sit to Stand - Progress: Met Pt will go Stand to Sit: with modified independence PT Goal: Stand to Sit - Progress: Met Pt will Ambulate: >150 feet;with modified independence PT Goal: Ambulate - Progress: Progressing toward goal Pt will Go Up / Down Stairs: 1-2 stairs;with modified independence PT Goal: Up/Down Stairs - Progress: Progressing toward goal  Visit Information  Last PT Received On: 02/11/12 Assistance Needed: +1    Subjective Data  Subjective: reports she would like to review some questions re: ADLs with OT prior to d/c Patient Stated Goal: Home   Cognition  Overall Cognitive Status: Appears within functional limits for tasks assessed/performed Arousal/Alertness: Awake/alert Orientation Level: Appears intact for tasks assessed Behavior During Session: [facility] for tasks performed    Balance     End of Session PT - End of Session Equipment Utilized During Treatment: Back brace Activity Tolerance: Patient tolerated treatment well Patient left: in bed;with call bell/phone within reach   GP     Avalee Castrellon 02/11/2012, 9:15 AM Pager [phone]

## 2012-02-11 NOTE — Discharge Summary (Signed)
  Physician Discharge Summary  Patient ID: Michelle [name] MRN: [id number] DOB/AGE: 04-Jun-1940 72 y.o.  Admit date: 02/08/2012 Discharge date: 02/11/2012  Admission Diagnoses: L3-4 spinal stenosis, neurogenic claudication, lumbago, lumbar radiculopathy.  Discharge Diagnoses: The same Principal Problem:  *Lumbar stenosis with neurogenic claudication   Discharged Condition: good  Hospital Course: I admitted the patient to [facility] team. On that day I performed a L3-4 decompression, each patient, and fusion. The surgery went well.  The patient's postoperative course was unremarkable and by postop day #3 she was requesting discharge to home. The patient was given oral and written discharge instructions. All her questions were answered.  Consults: None Significant Diagnostic Studies: None Treatments: L3-4 decompression, each patient, and fusion. Discharge Exam: Blood pressure 118/47, pulse 67, temperature 98.3 F (36.8 C), temperature source Oral, resp. rate 16, height 5\' 2"  (1.575 m), weight 72.576 kg (160 lb), last menstrual period 04/19/1997, SpO2 97.00%. The patient is alert and pleasant. Her strength is normal.  Disposition: Home  Discharge Orders    Future Orders Please Complete By Expires   Diet - low sodium heart healthy      Increase activity slowly      Discharge instructions      Comments:   Call [phone] for a followup appointment.   No dressing needed      Call MD for:  temperature >100.4      Call MD for:  persistant nausea and vomiting      Call MD for:  severe uncontrolled pain      Call MD for:  redness, tenderness, or signs of infection (pain, swelling, redness, odor or green/yellow discharge around incision site)      Call MD for:  difficulty breathing, headache or visual disturbances      Call MD for:  hives      Call MD for:  persistant dizziness or light-headedness      Call MD for:  extreme fatigue          Medication List     As of  02/11/2012  9:27 AM    TAKE these medications         CALCIUM 600 + D PO   Take 1 tablet by mouth daily.      folic acid 1 MG tablet   Commonly known as: FOLVITE   Take 1 mg by mouth daily.      hydrochlorothiazide 25 MG tablet   Commonly known as: HYDRODIURIL   Take 12.5 mg by mouth daily.      methotrexate 2.5 MG tablet   Commonly known as: RHEUMATREX   Take 15 mg by mouth once a week. Caution:Chemotherapy. Protect from light.  TAKES ON SAT      metoprolol succinate 50 MG 24 hr tablet   Commonly known as: TOPROL-XL   Take 50 mg by mouth daily. Take with or immediately following a meal.      multivitamin tablet   Take 1 tablet by mouth daily.      oxyCODONE-acetaminophen 10-325 MG per tablet   Commonly known as: PERCOCET   Take 1 tablet by mouth every 4 (four) hours as needed for pain.      VITAMIN C PO   Take 1 tablet by mouth daily.      Vitamin D 2000 UNITS Caps   Take 1 capsule by mouth daily.         SignedCristi Loron 02/11/2012, 9:27 AM

## 2012-02-11 NOTE — Progress Notes (Signed)
Patient given D/C instructions and education. All questions answered to patient's satisfaction. Pt D/C home in no signs of acute distress.

## 2012-02-11 NOTE — Progress Notes (Signed)
Occupational Therapy Treatment Patient Details Name: Michelle [name] MRN: [id number] DOB: [birth year] Today's Date: 02/11/2012 Time: 4098-1191 OT Time Calculation (min): 38 min  OT Assessment / Plan / Recommendation Comments on Treatment Session Pt is supervision with ADLs.      Follow Up Recommendations  No OT follow up    Barriers to Discharge       Equipment Recommendations  None recommended by OT    Recommendations for Other Services    Frequency Min 3X/week   Plan Discharge plan remains appropriate    Precautions / Restrictions Precautions Precautions: Back Precaution Comments: Pt able to state 3/3 back precautions; required cues x 2 (for twisting, bending forward) Required Braces or Orthoses: Spinal Brace Spinal Brace: Lumbar corset;Applied in sitting position   Pertinent Vitals/Pain     ADL  Upper Body Bathing: Supervision/safety Where Assessed - Upper Body Bathing: Unsupported sitting Lower Body Bathing: Supervision/safety;Set up Where Assessed - Lower Body Bathing: Unsupported sit to stand Upper Body Dressing: Supervision/safety Where Assessed - Upper Body Dressing: Unsupported sitting Lower Body Dressing: Supervision/safety Where Assessed - Lower Body Dressing: Unsupported sit to stand Toilet Transfer: Supervision/safety Toilet Transfer Method: Sit to Barista: Comfort height toilet Toileting - Clothing Manipulation and Hygiene: Supervision/safety Where Assessed - Toileting Clothing Manipulation and Hygiene: Standing Transfers/Ambulation Related to ADLs: ambulates with supervision ADL Comments: Pt instructed in use of AE - dtr has purchased.  She was able to cross ankles over knees, so was also instructed how to perform LB ADLs without AE    OT Diagnosis:    OT Problem List:   OT Treatment Interventions:     OT Goals ADL Goals ADL Goal: Lower Body Dressing - Progress: Progressing toward goals ADL Goal: Toilet Transfer - Progress:  Progressing toward goals  Visit Information  Last OT Received On: 02/11/12 Assistance Needed: +1    Subjective Data      Prior Functioning       Cognition  Overall Cognitive Status: Appears within functional limits for tasks assessed/performed Arousal/Alertness: Awake/alert Orientation Level: Appears intact for tasks assessed Behavior During Session: [facility] for tasks performed    Mobility  Shoulder Instructions Bed Mobility Bed Mobility: Sitting - Scoot to Edge of Bed;Rolling Right;Right Sidelying to Sit Rolling Right: 6: Modified independent (Device/Increase time) Rolling Left: 6: Modified independent (Device/Increase time) Right Sidelying to Sit: 6: Modified independent (Device/Increase time) Sitting - Scoot to Edge of Bed: 6: Modified independent (Device/Increase time) Sit to Sidelying Right: 6: Modified independent (Device/Increase time);HOB flat Details for Bed Mobility Assistance: no cues needed Transfers Transfers: Sit to Stand;Stand to Sit Sit to Stand: 6: Modified independent (Device/Increase time);With upper extremity assist;From bed Stand to Sit: 5: Supervision;To bed;With upper extremity assist       Exercises      Balance     End of Session OT - End of Session Activity Tolerance: Patient tolerated treatment well Patient left: Other (comment) (ambulating with dtr)  GO     Karron Goens M 02/11/2012, 10:41 AM

## 2012-02-11 NOTE — Progress Notes (Signed)
Occupational Therapy Treatment Patient Details Name: Michelle [name] MRN: [id number] DOB: February 06, 1940 Today's Date: 02/11/2012 Time: 4098-1191 OT Time Calculation (min): 10 min  OT Assessment / Plan / Recommendation Comments on Treatment Session Pt requires     Follow Up Recommendations  No OT follow up    Barriers to Discharge       Equipment Recommendations  None recommended by OT    Recommendations for Other Services    Frequency Min 3X/week   Plan Discharge plan remains appropriate    Precautions / Restrictions Precautions Precautions: Back Precaution Booklet Issued: Yes (comment) Precaution Comments: Pt able to state 3/3 back precautions; required cues x 2 (for twisting, bending forward) Required Braces or Orthoses: Spinal Brace Spinal Brace: Lumbar corset;Applied in sitting position   Pertinent Vitals/Pain     ADL  Upper Body Bathing: Supervision/safety Where Assessed - Upper Body Bathing: Unsupported sitting Lower Body Bathing: Supervision/safety;Set up Where Assessed - Lower Body Bathing: Unsupported sit to stand Upper Body Dressing: Supervision/safety Where Assessed - Upper Body Dressing: Unsupported sitting Lower Body Dressing: Supervision/safety Where Assessed - Lower Body Dressing: Unsupported sit to stand Toilet Transfer: Supervision/safety Toilet Transfer Method: Sit to Barista: Comfort height toilet Toileting - Clothing Manipulation and Hygiene: Supervision/safety Where Assessed - Engineer, mining and Hygiene: Standing Tub/Shower Transfer: Supervision/safety Tub/Shower Transfer Method: Science writer: Grab bars Equipment Used: Back brace;Rolling walker Transfers/Ambulation Related to ADLs: ambulates with supervision ADL Comments: Discussed safe techniques for household management and meal prep.  Verbalizes understanding    OT Diagnosis:    OT Problem List:   OT Treatment Interventions:      OT Goals ADL Goals ADL Goal: Lower Body Dressing - Progress: Progressing toward goals ADL Goal: Toilet Transfer - Progress: Progressing toward goals ADL Goal: Tub/Shower Transfer - Progress: Progressing toward goals  Visit Information  Last OT Received On: 02/11/12 Assistance Needed: +1    Subjective Data      Prior Functioning       Cognition  Overall Cognitive Status: Appears within functional limits for tasks assessed/performed Arousal/Alertness: Awake/alert Orientation Level: Appears intact for tasks assessed Behavior During Session: [facility] for tasks performed    Mobility  Shoulder Instructions Bed Mobility Bed Mobility: Sitting - Scoot to Edge of Bed;Rolling Right;Right Sidelying to Sit Rolling Right: 6: Modified independent (Device/Increase time) Rolling Left: 6: Modified independent (Device/Increase time) Right Sidelying to Sit: 6: Modified independent (Device/Increase time) Sitting - Scoot to Edge of Bed: 6: Modified independent (Device/Increase time) Sit to Sidelying Right: 6: Modified independent (Device/Increase time);HOB flat Details for Bed Mobility Assistance: no cues needed Transfers Transfers: Sit to Stand;Stand to Sit Sit to Stand: 6: Modified independent (Device/Increase time);With upper extremity assist;From bed Stand to Sit: To bed;With upper extremity assist;6: Modified independent (Device/Increase time)       Exercises      Balance     End of Session OT - End of Session Equipment Utilized During Treatment: Back brace Activity Tolerance: Patient tolerated treatment well Patient left: in chair;with call bell/phone within reach;with family/visitor present  GO     Lanis Storlie, Ursula Alert M 02/11/2012, 12:52 PM

## 2012-03-14 ENCOUNTER — Other Ambulatory Visit: Payer: Self-pay | Admitting: Internal Medicine

## 2012-03-15 DIAGNOSIS — M5137 Other intervertebral disc degeneration, lumbosacral region: Secondary | ICD-10-CM | POA: Diagnosis not present

## 2012-05-02 DIAGNOSIS — IMO0002 Reserved for concepts with insufficient information to code with codable children: Secondary | ICD-10-CM | POA: Diagnosis not present

## 2012-05-02 DIAGNOSIS — M6530 Trigger finger, unspecified finger: Secondary | ICD-10-CM | POA: Diagnosis not present

## 2012-05-02 DIAGNOSIS — L4050 Arthropathic psoriasis, unspecified: Secondary | ICD-10-CM | POA: Diagnosis not present

## 2012-06-21 DIAGNOSIS — M549 Dorsalgia, unspecified: Secondary | ICD-10-CM | POA: Diagnosis not present

## 2012-06-21 DIAGNOSIS — M545 Low back pain: Secondary | ICD-10-CM | POA: Diagnosis not present

## 2012-07-04 DIAGNOSIS — M545 Low back pain: Secondary | ICD-10-CM | POA: Diagnosis not present

## 2012-07-05 DIAGNOSIS — L4050 Arthropathic psoriasis, unspecified: Secondary | ICD-10-CM | POA: Diagnosis not present

## 2012-07-11 DIAGNOSIS — N6009 Solitary cyst of unspecified breast: Secondary | ICD-10-CM | POA: Diagnosis not present

## 2012-07-12 DIAGNOSIS — M545 Low back pain: Secondary | ICD-10-CM | POA: Diagnosis not present

## 2012-07-14 DIAGNOSIS — M545 Low back pain: Secondary | ICD-10-CM | POA: Diagnosis not present

## 2012-07-19 DIAGNOSIS — M545 Low back pain: Secondary | ICD-10-CM | POA: Diagnosis not present

## 2012-07-21 DIAGNOSIS — M545 Low back pain: Secondary | ICD-10-CM | POA: Diagnosis not present

## 2012-07-25 DIAGNOSIS — M545 Low back pain: Secondary | ICD-10-CM | POA: Diagnosis not present

## 2012-07-26 ENCOUNTER — Other Ambulatory Visit: Payer: Self-pay | Admitting: Internal Medicine

## 2012-07-27 NOTE — Telephone Encounter (Signed)
Pt given only 30 tablets-due for an OV.  Refill done.

## 2012-08-03 ENCOUNTER — Other Ambulatory Visit: Payer: Self-pay | Admitting: Internal Medicine

## 2012-08-03 NOTE — Telephone Encounter (Signed)
Patient needs to schedule a CPX  

## 2012-08-22 DIAGNOSIS — IMO0002 Reserved for concepts with insufficient information to code with codable children: Secondary | ICD-10-CM | POA: Diagnosis not present

## 2012-08-22 DIAGNOSIS — M6530 Trigger finger, unspecified finger: Secondary | ICD-10-CM | POA: Diagnosis not present

## 2012-08-22 DIAGNOSIS — L4050 Arthropathic psoriasis, unspecified: Secondary | ICD-10-CM | POA: Diagnosis not present

## 2012-08-25 IMAGING — CR DG CHEST 2V
2 series · 2 of 2 positions shown · non-contrast
Comparison: 09/25/2010

CLINICAL DATA: Cough.  Congestion.  Fever.

CHEST - 2 VIEW

[w chest pa]
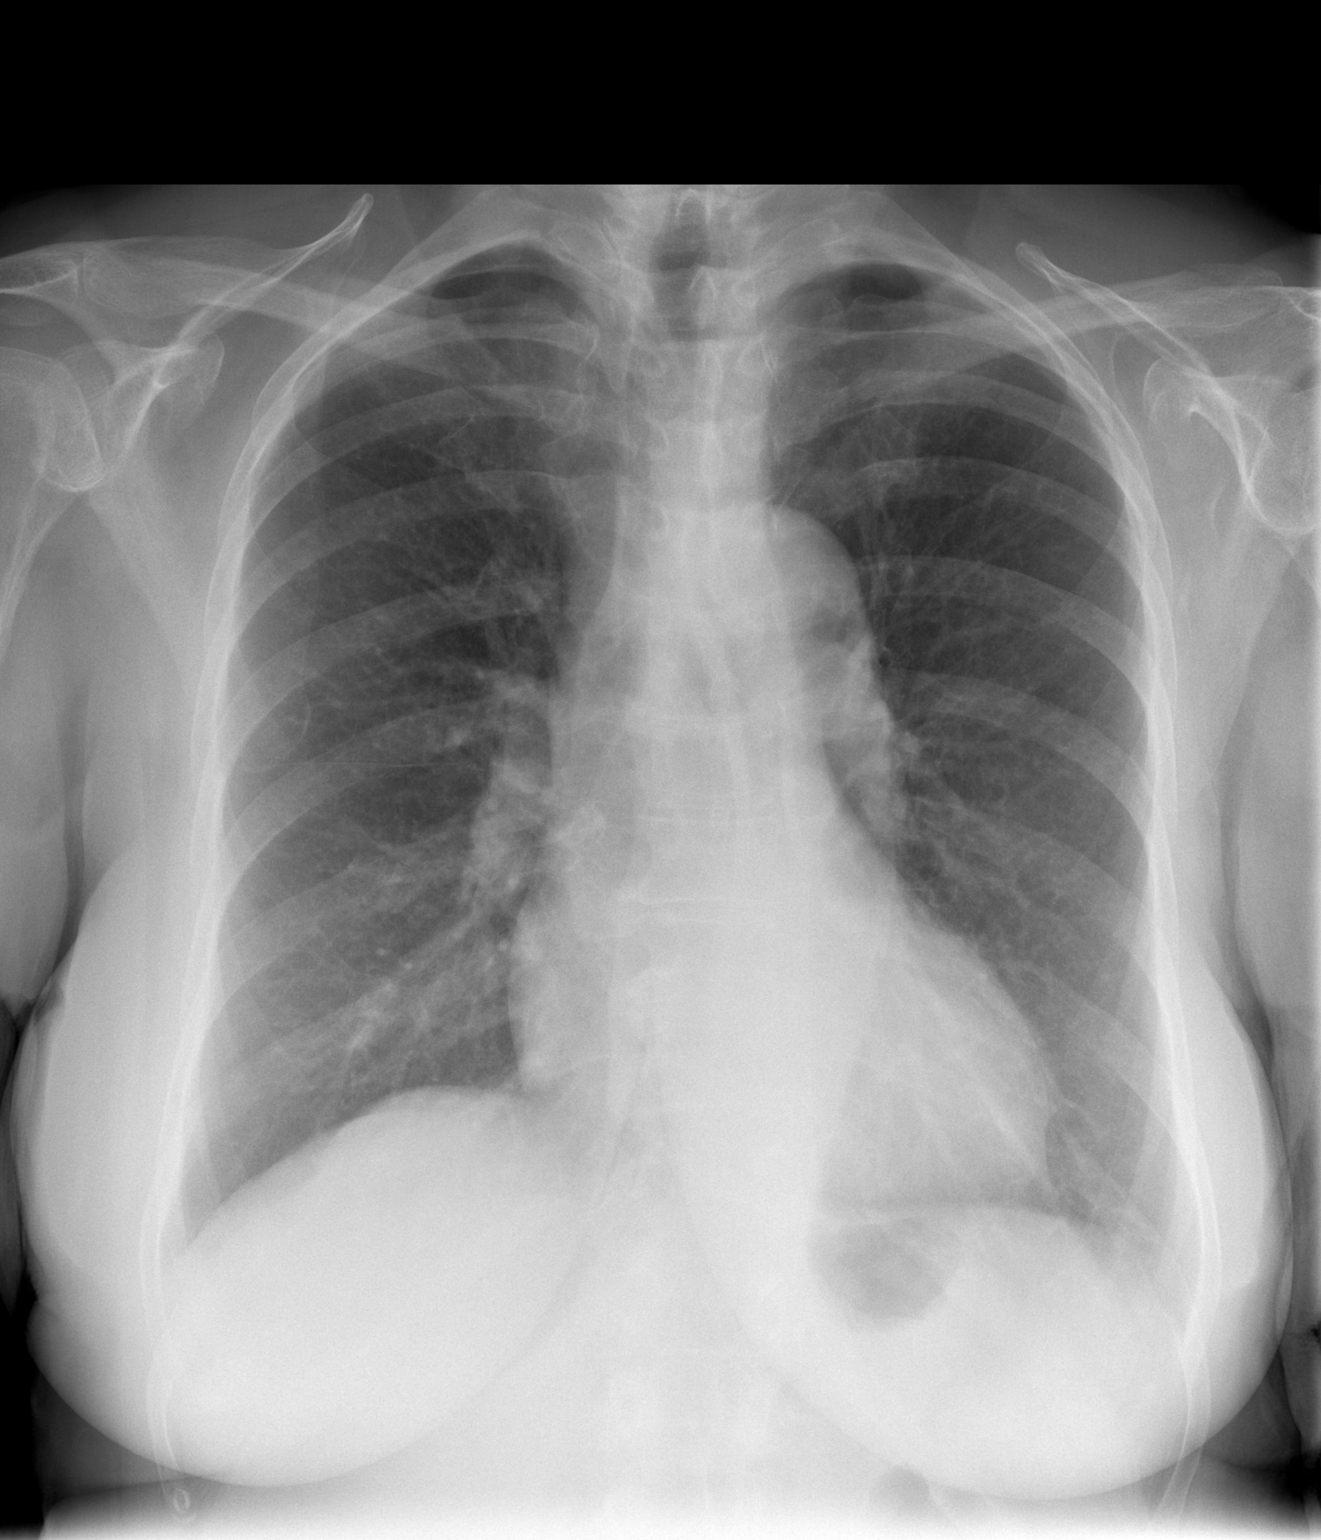

[w chest lat]
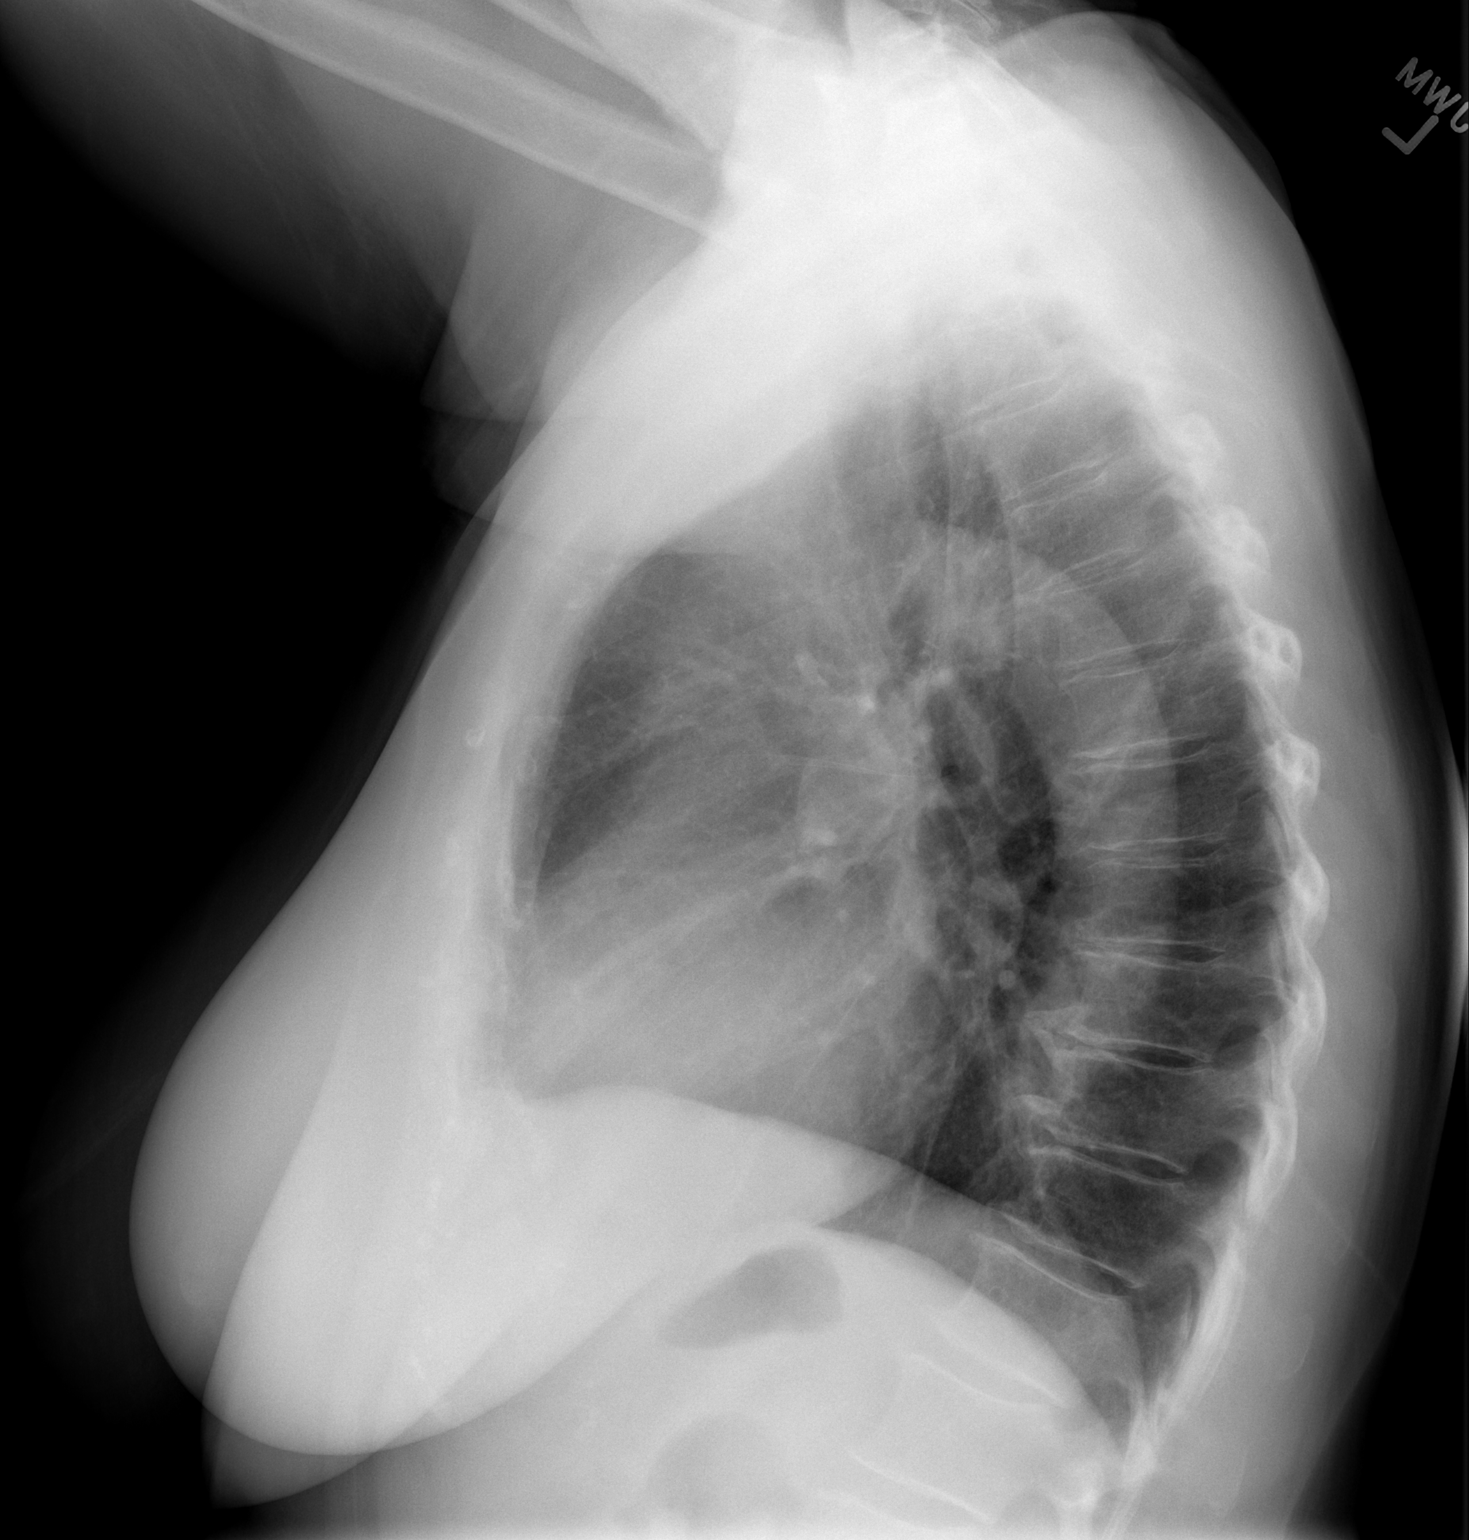

[2 of 2 positions shown; findings below may reference images not displayed]

FINDINGS: Heart size is normal.  The aorta is unfolded.  Lungs are
clear.  No infiltrate, collapse or effusion.  Mild scarring in the
lingula remains evident.
IMPRESSION: No active disease.

## 2012-10-05 ENCOUNTER — Other Ambulatory Visit: Payer: Self-pay | Admitting: Internal Medicine

## 2012-10-05 NOTE — Telephone Encounter (Signed)
Med filled #30 pt needs oV>

## 2012-10-24 DIAGNOSIS — L4050 Arthropathic psoriasis, unspecified: Secondary | ICD-10-CM | POA: Diagnosis not present

## 2012-11-01 DIAGNOSIS — M549 Dorsalgia, unspecified: Secondary | ICD-10-CM | POA: Diagnosis not present

## 2012-11-09 ENCOUNTER — Other Ambulatory Visit: Payer: Self-pay | Admitting: Internal Medicine

## 2012-11-09 NOTE — Telephone Encounter (Signed)
Metoprolol refill sent to pharmacy. OV is due

## 2012-11-24 ENCOUNTER — Telehealth: Payer: Self-pay

## 2012-11-24 NOTE — Telephone Encounter (Signed)
Left message for call back Spoke with son, Alfredia Desanctis, on DPR Will advise patient of the need to [facility]

## 2012-11-24 NOTE — Telephone Encounter (Signed)
Medication and allergies: reviewed and updated  90 day supply/mail order: na Local pharmacy: [facility] [address]   Immunizations due:  Admin flu vaccine upon arrival  A/P:   Back Surgery 02/2012-- Dr Delma Officer [facility]--2007 per patient Sees gyn   To Discuss with Provider: BP elevated--will bring readings

## 2012-11-28 ENCOUNTER — Encounter: Payer: Self-pay | Admitting: Internal Medicine

## 2012-11-28 ENCOUNTER — Ambulatory Visit (INDEPENDENT_AMBULATORY_CARE_PROVIDER_SITE_OTHER): Payer: Medicare Other | Admitting: Internal Medicine

## 2012-11-28 VITALS — BP 140/80 | HR 63 | Temp 97.9°F | Resp 13 | Ht 62.0 in | Wt 168.0 lb

## 2012-11-28 DIAGNOSIS — M899 Disorder of bone, unspecified: Secondary | ICD-10-CM

## 2012-11-28 DIAGNOSIS — R7309 Other abnormal glucose: Secondary | ICD-10-CM | POA: Diagnosis not present

## 2012-11-28 DIAGNOSIS — I10 Essential (primary) hypertension: Secondary | ICD-10-CM | POA: Diagnosis not present

## 2012-11-28 DIAGNOSIS — L4050 Arthropathic psoriasis, unspecified: Secondary | ICD-10-CM | POA: Diagnosis not present

## 2012-11-28 DIAGNOSIS — Z0000 Encounter for general adult medical examination without abnormal findings: Secondary | ICD-10-CM | POA: Diagnosis not present

## 2012-11-28 DIAGNOSIS — T887XXA Unspecified adverse effect of drug or medicament, initial encounter: Secondary | ICD-10-CM

## 2012-11-28 MED ORDER — HYDROCHLOROTHIAZIDE 25 MG PO TABS: ORAL_TABLET | ORAL | Status: DC

## 2012-11-28 MED ORDER — METOPROLOL SUCCINATE ER 50 MG PO TB24: ORAL_TABLET | ORAL | Status: DC
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
## 2012-11-28 NOTE — Patient Instructions (Signed)
Order for labs entered into  the computer; these will be performed at [address] [facility]. across from [facility]. No appointment is necessary. Your next office appointment will be determined based upon review of your pending labs . Those instructions will be transmitted to you through My Chart . Minimal Blood Pressure Goal= AVERAGE < 140/90;  Ideal is an AVERAGE < 135/85. This AVERAGE should be calculated from @ least 5-7 BP readings taken @ different times of day on different days of week. You should not respond to isolated BP readings , but rather the AVERAGE for that week .Please bring your  blood pressure cuff to office visits to verify that it is reliable.It  can also be checked against the blood pressure device at the pharmacy. Finger or wrist cuffs are not dependable; an arm cuff is.  If you activate the  My Chart system; lab & Xray results will be released directly  to you as soon as I review & address these through the computer. If you choose not to sign up for My Chart within 36 hours of labs being drawn; results will be reviewed & interpretation added before being copied & mailed, causing a delay in getting the results to you.If you do not receive that report within 7-10 days ,please call. Additionally you can use this system to gain direct  access to your records  if  out of town or @ an office of a  physician who is not in  the My Chart network.  This improves continuity of care & places you in control of your medical record.

## 2012-12-06 ENCOUNTER — Other Ambulatory Visit (INDEPENDENT_AMBULATORY_CARE_PROVIDER_SITE_OTHER): Payer: Medicare Other

## 2012-12-06 DIAGNOSIS — M899 Disorder of bone, unspecified: Secondary | ICD-10-CM

## 2012-12-06 DIAGNOSIS — R7309 Other abnormal glucose: Secondary | ICD-10-CM

## 2012-12-06 DIAGNOSIS — I10 Essential (primary) hypertension: Secondary | ICD-10-CM

## 2012-12-06 DIAGNOSIS — T887XXA Unspecified adverse effect of drug or medicament, initial encounter: Secondary | ICD-10-CM | POA: Diagnosis not present

## 2012-12-06 LAB — HEPATIC FUNCTION PANEL
AST: 24 U/L (ref 0–37)
Albumin: 3.8 g/dL (ref 3.5–5.2)
Bilirubin, Direct: 0.1 mg/dL (ref 0.0–0.3)
Total Bilirubin: 0.8 mg/dL (ref 0.3–1.2)

## 2012-12-06 LAB — CBC WITH DIFFERENTIAL/PLATELET
Basophils Absolute: 0.1 10*3/uL (ref 0.0–0.1)
Basophils Relative: 1.1 % (ref 0.0–3.0)
Eosinophils Absolute: 0.3 10*3/uL (ref 0.0–0.7)
Lymphocytes Relative: 35.2 % (ref 12.0–46.0)
MCHC: 34.1 g/dL (ref 30.0–36.0)
MCV: 91.2 fl (ref 78.0–100.0)
Monocytes Absolute: 0.6 10*3/uL (ref 0.1–1.0)
Neutrophils Relative %: 50.1 % (ref 43.0–77.0)
Platelets: 326 10*3/uL (ref 150.0–400.0)
RDW: 14.1 % (ref 11.5–14.6)

## 2012-12-06 LAB — LIPID PANEL
Cholesterol: 145 mg/dL (ref 0–200)
HDL: 59.8 mg/dL (ref >39.00)
LDL Cholesterol: 73 mg/dL (ref 0–99)
Triglycerides: 63 mg/dL (ref 0.0–149.0)
VLDL: 12.6 mg/dL (ref 0.0–40.0)

## 2012-12-06 LAB — BASIC METABOLIC PANEL
BUN: 11 mg/dL (ref 6–23)
Calcium: 9.3 mg/dL (ref 8.4–10.5)
GFR: 102.59 mL/min (ref >60.00)
Glucose, Bld: 95 mg/dL (ref 70–99)
Potassium: 4.1 mEq/L (ref 3.5–5.1)
Sodium: 138 mEq/L (ref 135–145)

## 2012-12-07 ENCOUNTER — Encounter: Payer: Self-pay | Admitting: *Deleted

## 2012-12-07 NOTE — Progress Notes (Signed)
Letter mailed to patient.

## 2012-12-08 ENCOUNTER — Other Ambulatory Visit: Payer: Self-pay

## 2012-12-10 LAB — VITAMIN D 1,25 DIHYDROXY: Vitamin D 1, 25 (OH)2 Total: 38 pg/mL (ref 18–72)

## 2012-12-22 DIAGNOSIS — IMO0002 Reserved for concepts with insufficient information to code with codable children: Secondary | ICD-10-CM | POA: Diagnosis not present

## 2012-12-22 DIAGNOSIS — Z23 Encounter for immunization: Secondary | ICD-10-CM | POA: Diagnosis not present

## 2012-12-22 DIAGNOSIS — L4050 Arthropathic psoriasis, unspecified: Secondary | ICD-10-CM | POA: Diagnosis not present

## 2013-02-07 DIAGNOSIS — N6009 Solitary cyst of unspecified breast: Secondary | ICD-10-CM | POA: Diagnosis not present

## 2013-02-07 LAB — HM MAMMOGRAPHY

## 2013-03-22 DIAGNOSIS — Z01419 Encounter for gynecological examination (general) (routine) without abnormal findings: Secondary | ICD-10-CM | POA: Diagnosis not present

## 2013-04-10 ENCOUNTER — Ambulatory Visit: Payer: Medicare Other | Admitting: Internal Medicine

## 2013-04-24 DIAGNOSIS — L4050 Arthropathic psoriasis, unspecified: Secondary | ICD-10-CM | POA: Diagnosis not present

## 2013-04-24 DIAGNOSIS — IMO0002 Reserved for concepts with insufficient information to code with codable children: Secondary | ICD-10-CM | POA: Diagnosis not present

## 2013-04-25 ENCOUNTER — Encounter: Payer: Self-pay | Admitting: Internal Medicine

## 2013-04-25 ENCOUNTER — Ambulatory Visit (INDEPENDENT_AMBULATORY_CARE_PROVIDER_SITE_OTHER): Payer: Medicare Other | Admitting: Internal Medicine

## 2013-04-25 VITALS — BP 158/82 | HR 60 | Temp 98.4°F | Ht 62.0 in | Wt 169.0 lb

## 2013-04-25 DIAGNOSIS — I10 Essential (primary) hypertension: Secondary | ICD-10-CM | POA: Diagnosis not present

## 2013-04-25 DIAGNOSIS — L4050 Arthropathic psoriasis, unspecified: Secondary | ICD-10-CM

## 2013-04-25 MED ORDER — LISINOPRIL 10 MG PO TABS: 10.0000 mg | ORAL_TABLET | Freq: Every day | ORAL | Status: DC

## 2013-04-25 NOTE — Patient Instructions (Addendum)
Add on low dose ace inhibitor for now to your medications. return office visit in about 2 months . Labs BMP in about 1 month on  New med.     DASH Diet The DASH diet stands for "Dietary Approaches to Stop Hypertension." It is a healthy eating plan that has been shown to reduce high blood pressure (hypertension) in as little as 14 days, while also possibly providing other significant health benefits. These other health benefits include reducing the risk of breast cancer after menopause and reducing the risk of type 2 diabetes, heart disease, colon cancer, and stroke. Health benefits also include weight loss and slowing kidney failure in patients with chronic kidney disease.  DIET GUIDELINES  Limit salt (sodium). Your diet should contain less than 1500 mg of sodium daily.  Limit refined or processed carbohydrates. Your diet should include mostly whole grains. Desserts and added sugars should be used sparingly.  Include small amounts of heart-healthy fats. These types of fats include nuts, oils, and tub margarine. Limit saturated and trans fats. These fats have been shown to be harmful in the body. CHOOSING FOODS  The following food groups are based on a 2000 calorie diet. See your Registered Dietitian for individual calorie needs. Grains and Grain Products (6 to 8 servings daily)  Eat More Often: Whole-wheat bread, brown rice, whole-grain or wheat pasta, quinoa, popcorn without added fat or salt (air popped).  Eat Less Often: White bread, white pasta, white rice, cornbread. Vegetables (4 to 5 servings daily)  Eat More Often: Fresh, frozen, and canned vegetables. Vegetables may be raw, steamed, roasted, or grilled with a minimal amount of fat.  Eat Less Often/Avoid: Creamed or fried vegetables. Vegetables in a cheese sauce. Fruit (4 to 5 servings daily)  Eat More Often: All fresh, canned (in natural juice), or frozen fruits. Dried fruits without added sugar. One hundred percent fruit juice  ( cup [237 mL] daily).  Eat Less Often: Dried fruits with added sugar. Canned fruit in light or heavy syrup. [employer], Fish, and Poultry (2 servings or less daily. One serving is 3 to 4 oz [85-114 g]).  Eat More Often: Ninety percent or leaner ground beef, tenderloin, sirloin. Round cuts of beef, chicken breast, [place] breast. All fish. Grill, bake, or broil your meat. Nothing should be fried.  Eat Less Often/Avoid: Fatty cuts of meat, [place], or chicken leg, thigh, or wing. Fried cuts of meat or fish. Dairy (2 to 3 servings)  Eat More Often: Low-fat or fat-free milk, low-fat plain or light yogurt, reduced-fat or part-skim cheese.  Eat Less Often/Avoid: Milk (whole, 2%).Whole milk yogurt. Full-fat cheeses. Nuts, Seeds, and Legumes (4 to 5 servings per week)  Eat More Often: All without added salt.  Eat Less Often/Avoid: Salted nuts and seeds, canned beans with added salt. Fats and Sweets (limited)  Eat More Often: Vegetable oils, tub margarines without trans fats, sugar-free gelatin. Mayonnaise and salad dressings.  Eat Less Often/Avoid: Coconut oils, palm oils, butter, stick margarine, cream, half and half, cookies, candy, pie. FOR MORE INFORMATION The Dash Diet Eating Plan: www.dashdiet.org Document Released: 01/08/2011 Document Revised: 04/13/2011 Document Reviewed: 01/08/2011 [facility] Patient Information 2014 [place], [place].

## 2013-04-25 NOTE — Progress Notes (Signed)
Chief Complaint  Patient presents with  . Establish Care    Transfer from Dr. Linna Darner.  Would like to check her bp and complains of a cold.  Started on Saturday.    . Hypertension    HPI: Patient comes in as new patient visit .to me a transfer patient fron dr Garald Balding is decreasing size of his practice  She carries a diagnosis of psoriatic arthritis and cats Lab q 4 months 3 rheumatologist Dr. Amil Amen has been controlled with methotrexate and folic acid  HBP ; She has had this for 10 years  Increase slowly over rthe last few years.   146/80- range  Beekman. At that time she's getting to 150 and above range at times. She states this is what happened to her mother who had a to have medicine added she's been on HCTZ and Toprol for a number of years. In a controlled blood pressure at that time. He brings in a list of her readings. Has had a mild cold cough congestion no fever shortness of breath ROS: See pertinent positives and negatives per HPI. No chest pain shortness of breath recent falling has a history of back surgery x2 2000 01/02/2013 however she is doing well with this and able to exercise.  Past Medical History  Diagnosis Date  . Hypertension   . Arthritis     psoriatic--Dr.Beekman  . Osteopenia     Dr.John McComb  . Hyperglycemia    catract surgery  Family History  Problem Relation Age of Onset  . Kidney cancer Father     prostate   . Hypertension Mother     died at age 64   . Autoimmune disease Mother     Hemolytic Anemia  . Breast cancer Maternal Aunt   . Hypertension Maternal Aunt   . Stroke Maternal Grandfather 63  . Diabetes Neg Hx   . Heart disease Neg Hx   . Osteoporosis      Mother, 2 M aunts , MGM  . Atrial fibrillation Daughter 81   Past Surgical History  Procedure Laterality Date  . Total abdominal hysterectomy w/ bilateral salpingoophorectomy      For Fibroids  . Tubal ligation    . Cataract extraction, bilateral    . Back surgery   09/2010;02/2012    DISC REMOVED AND SPACER/FUSION PLACED   . Colonoscopy      negative     History   Social History  . Marital Status: Widowed    Spouse Name: N/A    Number of Children: N/A  . Years of Education: N/A   Occupational History  . Retired    Social History Main Topics  . Smoking status: Never Smoker   . Smokeless tobacco: Never Used  . Alcohol Use: No  . Drug Use: No  . Sexual Activity: None   Other Topics Concern  . 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   Childbirth x3    Outpatient Encounter Prescriptions as of 04/25/2013  Medication Sig  . Ascorbic Acid (VITAMIN C PO) Take 1 tablet by mouth daily.  . Calcium Carbonate (CALCIUM 600 PO) Take 1 tablet by mouth daily.  Marland Kitchen  Cholecalciferol (VITAMIN D) 2000 UNITS CAPS Take 1 capsule by mouth daily.   . folic acid (FOLVITE) 1 MG tablet Take 1 mg by mouth daily.    . hydrochlorothiazide (HYDRODIURIL) 25 MG tablet 1/2 by mouth daily  . methotrexate (RHEUMATREX) 2.5 MG tablet Take 15 mg by mouth once a week. Caution:Chemotherapy. Protect from light. TAKES ON SAT  . metoprolol succinate (TOPROL-XL) 50 MG 24 hr tablet TAKE 1 TABLET BY MOUTH EVERY DAY  . Multiple Vitamin (MULTIVITAMIN) tablet Take 1 tablet by mouth daily.  Marland Kitchen lisinopril (PRINIVIL,ZESTRIL) 10 MG tablet Take 1 tablet (10 mg total) by mouth daily.    EXAM:  BP 158/82  Pulse 60  Temp(Src) 98.4 F (36.9 C) (Oral)  Ht 5\' 2"  (1.575 m)  Wt 169 lb (76.658 kg)  BMI 30.90 kg/m2  SpO2 97%  LMP 04/19/1997  Body mass index is 30.9 kg/(m^2).  GENERAL: vitals reviewed and listed above, alert, oriented, appears well hydrated and in no acute distress HEENT: atraumatic, conjunctiva  clear, no obvious abnormalities on inspection of external nose and ears  mild congestion nose face nontender NECK: no obvious masses on inspection palpation no JVD or bruit is heard LUNGS: clear to auscultation bilaterally, no wheezes, rales or rhonchi, good air movement CV: HRRR, no clubbing cyanosis or  peripheral edema nl cap refill \ slight puffiness legs  Abdomen soft without organomegaly guarding or rebound MS: moves all extremities without noticeable focal  abnormality PSYCH: pleasant and cooperative, no obvious depression or anxiety ASSESSMENT AND PLAN:  Discussed the following assessment and plan:  HYPERTENSION, ESSENTIAL NOS - add low dose ace  with caution  intensify  rx at this time   Psoriatic arthropathy Add low dose acei to diuretic   For now   Other options oinc diurteic  and ccb if needed  Labs in 1 month ( BMP)   Rov in 2 months  -Patient advised to return or notify health care team  if symptoms worsen ,persist or new concerns arise.  Patient Instructions  Add on low dose ace inhibitor for now to your medications. return office visit in about 2 months . Labs BMP in about 1 month on  New med.     DASH Diet The DASH diet stands for "Dietary Approaches to Stop Hypertension." It is a healthy eating plan that has been shown to reduce high blood pressure (hypertension) in as little as 14 days, while also possibly providing other significant health benefits. These other health benefits include reducing the risk of breast cancer after menopause and reducing the risk of type 2 diabetes, heart disease, colon cancer, and stroke. Health benefits also include weight loss and slowing kidney failure in patients with chronic kidney disease.  DIET GUIDELINES  Limit salt (sodium). Your diet should contain less than 1500 mg of sodium daily.  Limit refined or processed carbohydrates. Your diet should include mostly whole grains. Desserts and added sugars should be used sparingly.  Include small amounts of heart-healthy fats. These types of fats include  nuts, oils, and tub margarine. Limit saturated and trans fats. These fats have been shown to be harmful in the body. CHOOSING FOODS  The following food groups are based on a 2000 calorie diet. See your Registered Dietitian for individual calorie needs. Grains and Grain Products (6 to 8 servings daily)  Eat More Often: Whole-wheat bread, brown rice, whole-grain or wheat pasta, quinoa, popcorn without added fat or salt (air popped).  Eat Less Often: White bread, white pasta,  white rice, cornbread. Vegetables (4 to 5 servings daily)  Eat More Often: Fresh, frozen, and canned vegetables. Vegetables may be raw, steamed, roasted, or grilled with a minimal amount of fat.  Eat Less Often/Avoid: Creamed or fried vegetables. Vegetables in a cheese sauce. Fruit (4 to 5 servings daily)  Eat More Often: All fresh, canned (in natural juice), or frozen fruits. Dried fruits without added sugar. One hundred percent fruit juice ( cup [237 mL] daily).  Eat Less Often: Dried fruits with added sugar. Canned fruit in light or heavy syrup. [employer], Fish, and Poultry (2 servings or less daily. One serving is 3 to 4 oz [85-114 g]).  Eat More Often: Ninety percent or leaner ground beef, tenderloin, sirloin. Round cuts of beef, chicken breast, [place] breast. All fish. Grill, bake, or broil your meat. Nothing should be fried.  Eat Less Often/Avoid: Fatty cuts of meat, [place], or chicken leg, thigh, or wing. Fried cuts of meat or fish. Dairy (2 to 3 servings)  Eat More Often: Low-fat or fat-free milk, low-fat plain or light yogurt, reduced-fat or part-skim cheese.  Eat Less Often/Avoid: Milk (whole, 2%).Whole milk yogurt. Full-fat cheeses. Nuts, Seeds, and Legumes (4 to 5 servings per week)  Eat More Often: All without added salt.  Eat Less Often/Avoid: Salted nuts and seeds, canned beans with added salt. Fats and Sweets (limited)  Eat More Often: Vegetable oils, tub margarines without trans fats,  sugar-free gelatin. Mayonnaise and salad dressings.  Eat Less Often/Avoid: Coconut oils, palm oils, butter, stick margarine, cream, half and half, cookies, candy, pie. FOR MORE INFORMATION The Dash Diet Eating Plan: www.dashdiet.org Document Released: 01/08/2011 Document Revised: 04/13/2011 Document Reviewed: 01/08/2011 [facility] Patient Information 2014 [place], [place].    Standley Brooking. Stan Cantave M.D.  Pre visit review using our clinic review tool, if applicable. No additional management support is needed unless otherwise documented below in the visit note.

## 2013-04-26 ENCOUNTER — Telehealth: Payer: Self-pay | Admitting: Internal Medicine

## 2013-04-26 NOTE — Telephone Encounter (Signed)
Relevant patient education assigned to patient using Emmi. ° °

## 2013-05-22 ENCOUNTER — Telehealth: Payer: Self-pay | Admitting: Internal Medicine

## 2013-05-22 NOTE — Telephone Encounter (Signed)
Change to losartan 50 mg take 1 po qd  Disp 30 refill x 3   Cough should subside in about a week or so

## 2013-05-22 NOTE — Telephone Encounter (Signed)
Pt said that  lisinopril (PRINIVIL,ZESTRIL) 10 MG tablet makes her cough and she does not think she will be able to continue taking this medicine would like something else

## 2013-05-23 NOTE — Telephone Encounter (Signed)
Left a message at the below listed number for the pt to return my call. 

## 2013-05-24 NOTE — Telephone Encounter (Signed)
Called and spoke to the pt.  She says her cough has subsided and is not currently having any problems.  She wants to wait to change medications. She has an upcoming appt on 06/12/13.  Will talk to [facility] at that time.  If the cough restarts than she will call back for the new prescription.

## 2013-05-24 NOTE — Telephone Encounter (Signed)
Spoke to the pt's daughter.  She is currently at the gyn.  Will give her a message to return my call.

## 2013-05-30 ENCOUNTER — Other Ambulatory Visit (INDEPENDENT_AMBULATORY_CARE_PROVIDER_SITE_OTHER): Payer: Medicare Other

## 2013-05-30 DIAGNOSIS — I10 Essential (primary) hypertension: Secondary | ICD-10-CM | POA: Diagnosis not present

## 2013-05-30 LAB — BASIC METABOLIC PANEL
BUN: 14 mg/dL (ref 6–23)
CALCIUM: 10.1 mg/dL (ref 8.4–10.5)
CO2: 30 mEq/L (ref 19–32)
Chloride: 101 mEq/L (ref 96–112)
Creatinine, Ser: 0.6 mg/dL (ref 0.4–1.2)
GFR: 98.7 mL/min (ref >60.00)
Glucose, Bld: 81 mg/dL (ref 70–99)
Potassium: 4 mEq/L (ref 3.5–5.1)
SODIUM: 137 meq/L (ref 135–145)

## 2013-06-02 ENCOUNTER — Telehealth: Payer: Self-pay | Admitting: Internal Medicine

## 2013-06-02 MED ORDER — LOSARTAN POTASSIUM 50 MG PO TABS: 50.0000 mg | ORAL_TABLET | Freq: Every day | ORAL | Status: DC

## 2013-06-02 NOTE — Telephone Encounter (Signed)
Patient notified that cough should be gone in a week and to pick up new rx at the pharmacy.

## 2013-06-02 NOTE — Telephone Encounter (Signed)
Pt states her cough from lisinopril (PRINIVIL,ZESTRIL) 10 MG tablet Is not better. Pt would like to switch to whatever med dr Regis Bill advises. [facility]

## 2013-06-12 ENCOUNTER — Encounter: Payer: Self-pay | Admitting: Internal Medicine

## 2013-06-12 ENCOUNTER — Ambulatory Visit (INDEPENDENT_AMBULATORY_CARE_PROVIDER_SITE_OTHER): Payer: Medicare Other | Admitting: Internal Medicine

## 2013-06-12 VITALS — BP 156/80 | Temp 97.7°F | Ht 62.0 in | Wt 164.0 lb

## 2013-06-12 DIAGNOSIS — L4050 Arthropathic psoriasis, unspecified: Secondary | ICD-10-CM | POA: Diagnosis not present

## 2013-06-12 DIAGNOSIS — I10 Essential (primary) hypertension: Secondary | ICD-10-CM | POA: Diagnosis not present

## 2013-06-12 MED ORDER — HYDROCHLOROTHIAZIDE 25 MG PO TABS: ORAL_TABLET | ORAL | Status: DC

## 2013-06-12 MED ORDER — LOSARTAN POTASSIUM 100 MG PO TABS: 100.0000 mg | ORAL_TABLET | Freq: Every day | ORAL | Status: DC

## 2013-06-12 MED ORDER — METOPROLOL SUCCINATE ER 50 MG PO TB24: ORAL_TABLET | ORAL | Status: DC

## 2013-06-12 NOTE — Progress Notes (Signed)
Chief Complaint  Patient presents with  . Follow-up    HPI: Michelle [name]  comes in today for follow up of  multiple medical problems.  BP follow . Up  As before but  Not checking bp yet  But changed off or the lisinipril  Cough is  90 % better.  Feels fine  Otherwise .almost out of other medications  ROS: See pertinent positives and negatives per HPI.no cp sob dizziness etc  Cough better no sob feels well otherwise   Past Medical History  Diagnosis Date  . Hypertension   . Arthritis     psoriatic--Dr.Beekman  . Osteopenia     Dr.John McComb  . Hyperglycemia     Family History  Problem Relation Age of Onset  . Kidney cancer Father     prostate   . Hypertension Mother     died at age 37   . Autoimmune disease Mother     Hemolytic Anemia  . Breast cancer Maternal Aunt   . Hypertension Maternal Aunt   . Stroke Maternal Grandfather 69  . Diabetes Neg Hx   . Heart disease Neg Hx   . Osteoporosis      Mother, 2 M aunts , MGM  . Atrial fibrillation Daughter 8    History   Social History  . Marital Status: Widowed    Spouse Name: N/A    Number of Children: N/A  . Years of Education: N/A   Occupational History  . Retired    Social History Main Topics  . Smoking status: Never Smoker   . Smokeless tobacco: Never Used  . Alcohol Use: No  . Drug Use: No  . Sexual Activity: None   Other Topics Concern  . 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   Childbirth x3    Outpatient Encounter Prescriptions as of 06/12/2013  Medication Sig  . Ascorbic Acid (VITAMIN C PO) Take 1 tablet by mouth daily.  . Calcium Carbonate (CALCIUM 600 PO) Take 1 tablet by mouth daily.  . Cholecalciferol (VITAMIN D) 2000 UNITS CAPS Take 1 capsule by mouth daily.   . folic acid  (FOLVITE) 1 MG tablet Take 1 mg by mouth daily.    . hydrochlorothiazide (HYDRODIURIL) 25 MG tablet 1/2 by mouth daily  . methotrexate (RHEUMATREX) 2.5 MG tablet Take 15 mg by mouth once a week. Caution:Chemotherapy. Protect from light. TAKES ON SAT  . metoprolol succinate (TOPROL-XL) 50 MG 24 hr tablet TAKE 1 TABLET BY MOUTH EVERY DAY  . Multiple Vitamin (MULTIVITAMIN) tablet Take 1 tablet by mouth daily.  . [DISCONTINUED] hydrochlorothiazide (HYDRODIURIL) 25 MG tablet 1/2 by mouth daily  . [DISCONTINUED] losartan (COZAAR) 50 MG tablet Take 1 tablet (50 mg total) by mouth daily.  . [DISCONTINUED] metoprolol succinate (TOPROL-XL) 50 MG 24 hr tablet TAKE 1 TABLET BY MOUTH EVERY DAY  . losartan (COZAAR) 100 MG tablet Take 1 tablet (100 mg total) by mouth daily.    EXAM:  BP 156/80  Temp(Src) 97.7 F (36.5 C) (Oral)  Ht 5\' 2"  (1.575 m)  Wt 164 lb (74.39 kg)  BMI 29.99 kg/m2  LMP 04/19/1997  Body mass index is  29.99 kg/(m^2). bp reading 150/80 right reg  140/78 right sitting large  GENERAL: vitals reviewed and listed above, alert, oriented, appears well hydrated and in no acute distress NECK: no obvious masses on inspection palpation  LUNGS: clear to auscultation bilaterally, no wheezes, rales or rhonchi, good air movement CV: HRRR, no clubbing cyanosis or  peripheral edema nl cap refill  PSYCH: pleasant and cooperative, no obvious depression or anxiety Lab Results  Component Value Date   WBC 7.0 12/06/2012   HGB 14.3 12/06/2012   HCT 42.1 12/06/2012   PLT 326.0 12/06/2012   GLUCOSE 81 05/30/2013   CHOL 145 12/06/2012   TRIG 63.0 12/06/2012   HDL 59.80 12/06/2012   LDLCALC 73 12/06/2012   ALT 16 12/06/2012   AST 24 12/06/2012   NA 137 05/30/2013   K 4.0 05/30/2013   CL 101 05/30/2013   CREATININE 0.6 05/30/2013   BUN 14 05/30/2013   CO2 30 05/30/2013   TSH 1.39 12/06/2012   HGBA1C 5.4 09/06/2007    ASSESSMENT AND PLAN:  Discussed the following assessment and plan:  HYPERTENSION,  ESSENTIAL NOS - up today cough better adjsut up to 100 per day and fu 3 months or as needed  - Plan: metoprolol succinate (TOPROL-XL) 50 MG 24 hr tablet, hydrochlorothiazide (HYDRODIURIL) 25 MG tablet  Psoriatic arthropathy  -Patient advised to return or notify health care team  if symptoms worsen ,persist or new concerns arise.  Patient Instructions  bp is still  a bit high Check readings at home at least 3 x per week and record  Goal is below 140/90 optimum and feeling ok.  Increase losartan to 100 mg per day  ( take 2 50 mg )  Stay on the low dose hctz.   rov in 3 months or as needed.   Standley Brooking. Panosh M.D.  Pre visit review using our clinic review tool, if applicable. No additional management support is needed unless otherwise documented below in the visit note.  After patient left noted that  She just had changed to losartan about 1 week ago but presumption that bp .    still not controlled  Uncertain if enough time to get therapeutic effect.

## 2013-06-12 NOTE — Patient Instructions (Signed)
bp is still  a bit high Check readings at home at least 3 x per week and record  Goal is below 140/90 optimum and feeling ok.  Increase losartan to 100 mg per day  ( take 2 50 mg )  Stay on the low dose hctz.   rov in 3 months or as needed.

## 2013-06-13 ENCOUNTER — Telehealth: Payer: Self-pay | Admitting: Internal Medicine

## 2013-06-13 NOTE — Telephone Encounter (Signed)
Relevant patient education assigned to patient using Emmi. ° °

## 2013-06-27 DIAGNOSIS — L4050 Arthropathic psoriasis, unspecified: Secondary | ICD-10-CM | POA: Diagnosis not present

## 2013-08-29 DIAGNOSIS — IMO0002 Reserved for concepts with insufficient information to code with codable children: Secondary | ICD-10-CM | POA: Diagnosis not present

## 2013-08-29 DIAGNOSIS — L4050 Arthropathic psoriasis, unspecified: Secondary | ICD-10-CM | POA: Diagnosis not present

## 2013-09-18 ENCOUNTER — Ambulatory Visit (INDEPENDENT_AMBULATORY_CARE_PROVIDER_SITE_OTHER): Payer: Medicare Other | Admitting: Internal Medicine

## 2013-09-18 ENCOUNTER — Encounter: Payer: Self-pay | Admitting: Internal Medicine

## 2013-09-18 VITALS — BP 164/94 | Temp 98.1°F | Ht 62.0 in | Wt 171.0 lb

## 2013-09-18 DIAGNOSIS — I10 Essential (primary) hypertension: Secondary | ICD-10-CM

## 2013-09-18 MED ORDER — CHLORTHALIDONE 25 MG PO TABS: 12.5000 mg | ORAL_TABLET | Freq: Every day | ORAL | Status: DC

## 2013-09-18 NOTE — Progress Notes (Signed)
Pre visit review using our clinic review tool, if applicable. No additional management support is needed unless otherwise documented below in the visit note.  Chief Complaint  Patient presents with  . Follow-up    HPI: Here for fu HT bp management  Has wrist machine  brings in readings  Exercises  3 x per week and walk   Good.  Left wrist,  At home.   Few readings 150 range  756 range  Diastolic 43P . Had a couple episodes of lightheadedness without exercise palpation  Or syncope  Had juice and her meds and felt better . No cp sob other sx. Feels fine now.  ROS: See pertinent positives and negatives per HPI.arthritis is doing well  Not on an nsaid.  Past Medical History  Diagnosis Date  . Hypertension   . Arthritis     psoriatic--Dr.Beekman  . Osteopenia     Dr.John McComb  . Hyperglycemia     Family History  Problem Relation Age of Onset  . Kidney cancer Father     prostate   . Hypertension Mother     died at age 82   . Autoimmune disease Mother     Hemolytic Anemia  . Breast cancer Maternal Aunt   . Hypertension Maternal Aunt   . Stroke Maternal Grandfather 73  . Diabetes Neg Hx   . Heart disease Neg Hx   . Osteoporosis      Mother, 2 M aunts , MGM  . Atrial fibrillation Daughter 49    History   Social History  . Marital Status: Widowed    Spouse Name: N/A    Number of Children: N/A  . Years of Education: N/A   Occupational History  . Retired    Social History Main Topics  . Smoking status: Never Smoker   . Smokeless tobacco: Never Used  . Alcohol Use: No  . Drug Use: No  . Sexual Activity: None   Other Topics Concern  . 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   Childbirth x3    Outpatient Encounter Prescriptions as of 09/18/2013  Medication  Sig  . Ascorbic Acid (VITAMIN C PO) Take 1 tablet by mouth daily.  . Calcium Carbonate (CALCIUM 600 PO) Take 1 tablet by mouth daily.  . Cholecalciferol (VITAMIN D) 2000 UNITS CAPS Take 1 capsule by mouth daily.   . folic acid (FOLVITE) 1 MG tablet Take 1 mg by mouth daily.    Marland Kitchen losartan (COZAAR) 100 MG tablet Take 1 tablet (100 mg total) by mouth daily.  . methotrexate (RHEUMATREX) 2.5 MG tablet Take 15 mg by mouth once a week. Caution:Chemotherapy. Protect from light. TAKES ON SAT  . metoprolol succinate (TOPROL-XL) 50 MG 24 hr tablet TAKE 1 TABLET BY MOUTH EVERY DAY  . Multiple Vitamin (MULTIVITAMIN) tablet Take 1 tablet by mouth daily.  . [DISCONTINUED] hydrochlorothiazide (HYDRODIURIL) 25 MG tablet 1/2 by mouth daily  . chlorthalidone (HYGROTON) 25 MG tablet Take 0.5-1 tablets (12.5-25 mg total) by mouth daily. As directed    EXAM:  BP 164/94  Temp(Src) 98.1 F (36.7 C) (Oral)  Ht 5\' 2"  (1.575 m)  Wt 171 lb (77.565  kg)  BMI 31.27 kg/m2  LMP 04/19/1997  Body mass index is 31.27 kg/(m^2). BP Readings from Last 3 Encounters:  09/18/13 164/94  06/12/13 156/80  04/25/13 158/82    GENERAL: vitals reviewed and listed above, alert, oriented, appears well hydrated and in no acute distress HEENT: atraumatic, conjunctiva  clear, no obvious abnormalities on inspection of external nose and ears has nasal allergi ccrease  NECK: no obvious masses on inspection palpation   CV: HRRR,MS: moves all extremities without noticeable focal  abnormality PSYCH: pleasant and cooperative, no obvious depression or anxiety BP right 160/88  Reg 158/80 large  Pat wrist cuff.   143/73   / 154/91 repositioned .  Left wrist 143/83   Repositioned :  155/91 Records reviewed  Readings taken by patient and this provider  ASSESSMENT AND PLAN:  Discussed the following assessment and plan:  Unspecified essential hypertension Readings good at home but wrist machine getting lower than cuff in office  More  consistent with repositioning.  Advise intensification of medication for bp based on this . She will monitor at home contat [place] if low or too high bp readings and fu in about 3 months .  checkl bmp in 3-4 weeks after cahngeing  To low dose chlorthalidone   Also fu earlier if lighthededness recurring   persistent or progressive or any alarming concerns -Patient advised to return or notify health care team  if symptoms worsen ,persist or new concerns arise.  Patient Instructions  Continue monitor your blood pressure in the postioning that we discussed in office.  Change the diuretic pill continue on the losartan 100 mg   Plan ROV in 2-3 months  Check BMP in about 3-4 weeks after changing medication   Standley Brooking. Skyann Ganim M.D. Total visit 47mins > 50% spent counseling and coordinating care

## 2013-09-18 NOTE — Patient Instructions (Signed)
Continue monitor your blood pressure in the postioning that we discussed in office.  Change the diuretic pill continue on the losartan 100 mg   Plan ROV in 2-3 months  Check BMP in about 3-4 weeks after changing medication

## 2013-10-16 ENCOUNTER — Other Ambulatory Visit (INDEPENDENT_AMBULATORY_CARE_PROVIDER_SITE_OTHER): Payer: Medicare Other

## 2013-10-16 DIAGNOSIS — I10 Essential (primary) hypertension: Secondary | ICD-10-CM | POA: Diagnosis not present

## 2013-10-16 LAB — BASIC METABOLIC PANEL
BUN: 13 mg/dL (ref 6–23)
CHLORIDE: 102 meq/L (ref 96–112)
CO2: 29 mEq/L (ref 19–32)
CREATININE: 0.8 mg/dL (ref 0.4–1.2)
Calcium: 10.2 mg/dL (ref 8.4–10.5)
GFR: 79.41 mL/min (ref >60.00)
Glucose, Bld: 88 mg/dL (ref 70–99)
POTASSIUM: 4.4 meq/L (ref 3.5–5.1)
Sodium: 139 mEq/L (ref 135–145)

## 2013-10-31 DIAGNOSIS — M545 Low back pain, unspecified: Secondary | ICD-10-CM | POA: Diagnosis not present

## 2013-10-31 DIAGNOSIS — L4050 Arthropathic psoriasis, unspecified: Secondary | ICD-10-CM | POA: Diagnosis not present

## 2013-10-31 DIAGNOSIS — Z6831 Body mass index (BMI) 31.0-31.9, adult: Secondary | ICD-10-CM | POA: Diagnosis not present

## 2013-10-31 DIAGNOSIS — I10 Essential (primary) hypertension: Secondary | ICD-10-CM | POA: Diagnosis not present

## 2013-11-01 ENCOUNTER — Encounter: Payer: Self-pay | Admitting: Family Medicine

## 2013-11-14 ENCOUNTER — Telehealth: Payer: Self-pay | Admitting: Internal Medicine

## 2013-11-14 MED ORDER — CHLORTHALIDONE 25 MG PO TABS: 12.5000 mg | ORAL_TABLET | Freq: Every day | ORAL | Status: DC

## 2013-11-14 NOTE — Telephone Encounter (Signed)
Sent to the pharmacy by e-scribe.  Pt has upcoming appt on 12/19/13

## 2013-11-14 NOTE — Telephone Encounter (Signed)
[id number] [place], [place] - [address] is requesting 90 day re-fill on  chlorthalidone (HYGROTON) 25 MG tablet

## 2013-12-04 ENCOUNTER — Encounter: Payer: Self-pay | Admitting: Internal Medicine

## 2013-12-19 ENCOUNTER — Encounter: Payer: Self-pay | Admitting: Internal Medicine

## 2013-12-19 ENCOUNTER — Ambulatory Visit (INDEPENDENT_AMBULATORY_CARE_PROVIDER_SITE_OTHER): Payer: Medicare Other | Admitting: Internal Medicine

## 2013-12-19 VITALS — BP 144/88 | Temp 97.8°F | Ht 62.0 in | Wt 169.0 lb

## 2013-12-19 DIAGNOSIS — Z23 Encounter for immunization: Secondary | ICD-10-CM

## 2013-12-19 DIAGNOSIS — I10 Essential (primary) hypertension: Secondary | ICD-10-CM

## 2013-12-19 DIAGNOSIS — L4050 Arthropathic psoriasis, unspecified: Secondary | ICD-10-CM

## 2013-12-19 NOTE — Progress Notes (Signed)
Pre visit review using our clinic review tool, if applicable. No additional management support is needed unless otherwise documented below in the visit note.  Chief Complaint  Patient presents with  . Follow-up  . Hypertension    HPI: [name] 73 y.o.  Chronic disease management HT Since last visit we changed her diuretic losartan may be given her slight lightheadedness comes and goes thinks it's getting better able to exercise no syncope palpitations Swelling is decreased with a new diuretic and changes with intake and salt is trying to do lifestyle changes feels pretty good. Has a list of blood pressure readings since last August. ROS: See pertinent positives and negatives per HPI. No syncope cp sob  Past Medical History  Diagnosis Date  . Hypertension   . Arthritis     psoriatic--Dr.Beekman  . Osteopenia     Dr.John McComb  . Hyperglycemia     Family History  Problem Relation Age of Onset  . Kidney cancer Father     prostate   . Hypertension Mother     died at age 18   . Autoimmune disease Mother     Hemolytic Anemia  . Breast cancer Maternal Aunt   . Hypertension Maternal Aunt   . Stroke Maternal Grandfather 1  . Diabetes Neg Hx   . Heart disease Neg Hx   . Osteoporosis      Mother, 2 M aunts , MGM  . Atrial fibrillation Daughter 29    History   Social History  . Marital Status: Widowed    Spouse Name: N/A    Number of Children: N/A  . Years of Education: N/A   Occupational History  . Retired    Social History Main Topics  . Smoking status: Never Smoker   . Smokeless tobacco: Never Used  . Alcohol Use: No  . Drug Use: No  . Sexual Activity: None   Other Topics Concern  . 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   Childbirth x3     Outpatient Encounter Prescriptions as of 12/19/2013  Medication Sig  . Ascorbic Acid (VITAMIN C PO) Take 1 tablet by mouth daily.  . Calcium Carbonate (CALCIUM 600 PO) Take 1 tablet by mouth daily.  . chlorthalidone (HYGROTON) 25 MG tablet Take 0.5-1 tablets (12.5-25 mg total) by mouth daily. As directed  . Cholecalciferol (VITAMIN D) 2000 UNITS CAPS Take 1 capsule by mouth daily.   . folic acid (FOLVITE) 1 MG tablet Take 1 mg by mouth daily.    Marland Kitchen losartan (COZAAR) 100 MG tablet Take 1 tablet (100 mg total) by mouth daily.  . methotrexate (RHEUMATREX) 2.5 MG tablet Take 15 mg by mouth once a week. Caution:Chemotherapy. Protect from light. TAKES ON SAT  . metoprolol succinate (TOPROL-XL) 50 MG 24 hr tablet TAKE 1 TABLET BY MOUTH EVERY DAY  . Multiple Vitamin (MULTIVITAMIN) tablet Take 1 tablet by mouth daily.    EXAM:  BP 144/88 mmHg  Temp(Src) 97.8 F (36.6 C) (Oral)  Ht 5\' 2"  (1.575 m)  Wt 169 lb (76.658 kg)  BMI 30.90 kg/m2  LMP 04/19/1997  Body mass index is 30.9 kg/(m^2). bp readings  144/88  r reg  146/78 r lge pt wrist machine 152/90  GENERAL: vitals reviewed and listed above, alert, oriented, appears well hydrated and in no acute distress HEENT: atraumatic, conjunctiva  clear, no obvious abnormalities on inspection of external nose and ears  CV: HRRR, no clubbing cyanosis or  peripheral edema nl cap refill  MS: moves all extremities without noticeable focal  abnormality PSYCH: pleasant and cooperative, no obvious depression or anxiety Log reviewed  2 months 10 readings 9 were in range 148 was elevated previous 161 diastolics are in the 70 to 80 range. ASSESSMENT AND PLAN:  Discussed the following assessment and plan:  Essential hypertension - up a bit today in the office by readings at home are improving wrist machine is reasonably correlating with cough continue lifestyle medication labs today fu50m - Plan: Basic metabolic panel  Psoriatic arthropathy  Need for  prophylactic vaccination and inoculation against influenza - Plan: Flu vaccine HIGH DOSE PF (Fluzone Tri High dose) ? If se of meds  -Patient advised to return or notify health care team  if symptoms worsen ,persist or new concerns arise.  Patient Instructions  Continue same regimen for now   Light headed feeling should go away   Continue BP monitoring good enough to follow    Mariann Laster K. Panosh M.D.

## 2013-12-19 NOTE — Patient Instructions (Signed)
Continue same regimen for now   Light headed feeling should go away   Continue BP monitoring good enough to follow

## 2013-12-20 LAB — BASIC METABOLIC PANEL
BUN: 16 mg/dL (ref 6–23)
CO2: 30 meq/L (ref 19–32)
Calcium: 9.4 mg/dL (ref 8.4–10.5)
Chloride: 103 mEq/L (ref 96–112)
Creatinine, Ser: 0.9 mg/dL (ref 0.4–1.2)
GFR: 65.3 mL/min (ref >60.00)
GLUCOSE: 92 mg/dL (ref 70–99)
POTASSIUM: 4.6 meq/L (ref 3.5–5.1)
Sodium: 138 mEq/L (ref 135–145)

## 2013-12-21 ENCOUNTER — Encounter: Payer: Self-pay | Admitting: Family Medicine

## 2014-01-02 DIAGNOSIS — L4050 Arthropathic psoriasis, unspecified: Secondary | ICD-10-CM | POA: Diagnosis not present

## 2014-01-02 DIAGNOSIS — M5136 Other intervertebral disc degeneration, lumbar region: Secondary | ICD-10-CM | POA: Diagnosis not present

## 2014-01-03 DIAGNOSIS — Z961 Presence of intraocular lens: Secondary | ICD-10-CM | POA: Diagnosis not present

## 2014-02-08 DIAGNOSIS — Z803 Family history of malignant neoplasm of breast: Secondary | ICD-10-CM | POA: Diagnosis not present

## 2014-02-08 DIAGNOSIS — Z1231 Encounter for screening mammogram for malignant neoplasm of breast: Secondary | ICD-10-CM | POA: Diagnosis not present

## 2014-03-05 DIAGNOSIS — L4050 Arthropathic psoriasis, unspecified: Secondary | ICD-10-CM | POA: Diagnosis not present

## 2014-03-10 ENCOUNTER — Other Ambulatory Visit: Payer: Self-pay | Admitting: Internal Medicine

## 2014-03-12 NOTE — Telephone Encounter (Signed)
Sent to the pharmacy by e-scribe.  Pt has upcoming appt on 03/20/14

## 2014-03-20 ENCOUNTER — Encounter: Payer: Self-pay | Admitting: Internal Medicine

## 2014-03-20 ENCOUNTER — Ambulatory Visit (INDEPENDENT_AMBULATORY_CARE_PROVIDER_SITE_OTHER): Payer: Medicare Other | Admitting: Internal Medicine

## 2014-03-20 VITALS — BP 158/80 | Temp 97.9°F | Ht 61.5 in | Wt 172.1 lb

## 2014-03-20 DIAGNOSIS — I10 Essential (primary) hypertension: Secondary | ICD-10-CM | POA: Diagnosis not present

## 2014-03-20 NOTE — Progress Notes (Signed)
Pre visit review using our clinic review tool, if applicable. No additional management support is needed unless otherwise documented below in the visit note.  Chief Complaint  Patient presents with  . Follow-up    HPI: Michelle [name] 74 y.o. with ht psoriatic arthrits lumbar stenosis comes in for fu HT management  Has higher numbers in office than hoem see last visit  140/70   At home   160  feels lok.   ligiht headedness better at times.  Goes to the gyme  3-4 x per week and active adult fitness classes.  Sleep 8 hours   Ok .  fgeels well no cp sob syncope.  Cost of chlorthalidone has increase toe 90 $ for 90 days from 17$ per 90 days  Solver scrips has to use [facility] now [facility]? ROS: See pertinent positives and negatives per HPI.  Past Medical History  Diagnosis Date  . Hypertension   . Arthritis     psoriatic--Dr.Beekman  . Osteopenia     Dr.John McComb  . Hyperglycemia     Family History  Problem Relation Age of Onset  . Kidney cancer Father     prostate   . Hypertension Mother     died at age 52   . Autoimmune disease Mother     Hemolytic Anemia  . Breast cancer Maternal Aunt   . Hypertension Maternal Aunt   . Stroke Maternal Grandfather 39  . Diabetes Neg Hx   . Heart disease Neg Hx   . Osteoporosis      Mother, 2 M aunts , MGM  . Atrial fibrillation Daughter 63    History   Social History  . Marital Status: Widowed    Spouse Name: N/A  . Number of Children: N/A  . Years of Education: N/A   Occupational History  . Retired    Social History Main Topics  . Smoking status: Never Smoker   . Smokeless tobacco: Never Used  . Alcohol Use: No  . Drug Use: No  . Sexual Activity: Not on file   Other Topics Concern  . 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   Childbirth x3    Outpatient Encounter Prescriptions as of 03/20/2014  Medication Sig  . Ascorbic Acid (VITAMIN C PO) Take 1 tablet by mouth daily.  . Calcium Carbonate (CALCIUM 600 PO) Take 1 tablet by mouth daily.  . chlorthalidone (HYGROTON) 25 MG tablet TAKE 1/2-1 TABLET BY MOUTH EVERY DAY AS DIRECTED  . Cholecalciferol (VITAMIN D) 2000 UNITS CAPS Take 1 capsule by mouth daily.   . folic acid (FOLVITE) 1 MG tablet Take 1 mg by mouth daily.    Marland Kitchen losartan (COZAAR) 100 MG tablet Take 1 tablet (100 mg total) by mouth daily.  . methotrexate (RHEUMATREX) 2.5 MG tablet Take 15 mg by mouth once a week. Caution:Chemotherapy. Protect from light. TAKES ON SAT  . metoprolol succinate (TOPROL-XL) 50 MG 24 hr tablet TAKE 1 TABLET BY MOUTH EVERY DAY  . Multiple Vitamin (MULTIVITAMIN) tablet Take 1 tablet by mouth daily.    EXAM:  BP 158/80 mmHg  Temp(Src) 97.9 F (36.6 C) (Oral)  Ht 5'  1.5" (1.562 m)  Wt 172 lb 1.6 oz (78.064 kg)  BMI 32.00 kg/m2  LMP 04/19/1997  Body mass index is 32 kg/(m^2).  GENERAL: vitals reviewed and listed above, alert, oriented, appears well hydrated and in no acute distress look well HEENT: atraumatic, conjunctiva  clear, no obvious abnormalities on inspection of external nose and ears CV: HRRR, no clubbing no g or m  cyanosis or  peripheral edema nl cap refill  MS: moves all extremities without noticeable focal  abnormality PSYCH: pleasant and cooperative, no obvious depression or anxiety Lab Results  Component Value Date   WBC 7.0 12/06/2012   HGB 14.3 12/06/2012   HCT 42.1 12/06/2012   PLT 326.0 12/06/2012   GLUCOSE 92 12/19/2013   CHOL 145 12/06/2012   TRIG 63.0 12/06/2012   HDL 59.80 12/06/2012   LDLCALC 73 12/06/2012   ALT 16 12/06/2012   AST 24 12/06/2012   NA 138 12/19/2013   K 4.6 12/19/2013   CL 103 12/19/2013   CREATININE 0.9 12/19/2013   BUN 16 12/19/2013   CO2 30 12/19/2013   TSH 1.39 12/06/2012   HGBA1C 5.4 09/06/2007    BP Readings from Last 3 Encounters:  03/20/14 158/80  12/19/13 144/88  09/18/13 164/94    ASSESSMENT AND PLAN:  Discussed the following assessment and plan:  Essential hypertension Labile with WEC effect  Increase chlorthalidone to 25 mg per day  Bmp in 3-4 weeks lab only  ROV in 2-3 months  Check 5 days  In a row q month bid   Follow readings  -Patient advised to return or notify health care team  if symptoms worsen ,persist or new concerns arise.  Patient Instructions  Repeat bp was 156/80 . However if bp not 140 and below at home this week the increase the diuretic  to a whole pill of 25 mg per day .  BMP in 1 months  ROV in 3 months  With BP readings.  Continue  1000 iu virt d per day .      Standley Brooking. Doyal Saric M.D.

## 2014-03-20 NOTE — Patient Instructions (Signed)
Repeat bp was 156/80 . However if bp not 140 and below at home this week the increase the diuretic  to a whole pill of 25 mg per day .  BMP in 1 months  ROV in 3 months  With BP readings.  Continue  1000 iu virt d per day .

## 2014-04-16 ENCOUNTER — Other Ambulatory Visit: Payer: Medicare Other

## 2014-04-23 DIAGNOSIS — N958 Other specified menopausal and perimenopausal disorders: Secondary | ICD-10-CM | POA: Diagnosis not present

## 2014-04-23 DIAGNOSIS — M8588 Other specified disorders of bone density and structure, other site: Secondary | ICD-10-CM | POA: Diagnosis not present

## 2014-04-23 DIAGNOSIS — Z01419 Encounter for gynecological examination (general) (routine) without abnormal findings: Secondary | ICD-10-CM | POA: Diagnosis not present

## 2014-04-23 DIAGNOSIS — Z6831 Body mass index (BMI) 31.0-31.9, adult: Secondary | ICD-10-CM | POA: Diagnosis not present

## 2014-04-30 ENCOUNTER — Other Ambulatory Visit (INDEPENDENT_AMBULATORY_CARE_PROVIDER_SITE_OTHER): Payer: Medicare Other

## 2014-04-30 DIAGNOSIS — I10 Essential (primary) hypertension: Secondary | ICD-10-CM | POA: Diagnosis not present

## 2014-04-30 LAB — BASIC METABOLIC PANEL
BUN: 15 mg/dL (ref 6–23)
CALCIUM: 10.6 mg/dL — AB (ref 8.4–10.5)
CO2: 35 mEq/L — ABNORMAL HIGH (ref 19–32)
Chloride: 98 mEq/L (ref 96–112)
Creatinine, Ser: 0.72 mg/dL (ref 0.40–1.20)
GFR: 84.39 mL/min (ref >60.00)
Glucose, Bld: 94 mg/dL (ref 70–99)
Potassium: 4 mEq/L (ref 3.5–5.1)
SODIUM: 135 meq/L (ref 135–145)

## 2014-05-07 ENCOUNTER — Other Ambulatory Visit: Payer: Self-pay | Admitting: Family Medicine

## 2014-05-07 ENCOUNTER — Telehealth: Payer: Self-pay | Admitting: Family Medicine

## 2014-05-07 NOTE — Telephone Encounter (Signed)
Spoke to the pt.  She viewed lab results on MyChart.  Would like to know if she can wait until she comes back in May to do repeat lab work.  Going to Dr. [name] Noon office tomorrow and is willing to take an order there if [facility] would like. Pt taking 600 mg of calcium daily.

## 2014-05-07 NOTE — Telephone Encounter (Signed)
Can we give her an order  And see if dr Amil Amen will do this to avoid   Multiple  Lab sticks? otherwrise ok to wait

## 2014-05-08 DIAGNOSIS — L4050 Arthropathic psoriasis, unspecified: Secondary | ICD-10-CM | POA: Diagnosis not present

## 2014-05-08 DIAGNOSIS — L309 Dermatitis, unspecified: Secondary | ICD-10-CM | POA: Diagnosis not present

## 2014-05-08 DIAGNOSIS — M5136 Other intervertebral disc degeneration, lumbar region: Secondary | ICD-10-CM | POA: Diagnosis not present

## 2014-05-08 NOTE — Telephone Encounter (Signed)
Spoke to the pt.  She will come by and pick up an order to take with her to Dr. [name] Noon office.  Order placed at the front desk.

## 2014-05-14 ENCOUNTER — Encounter: Payer: Self-pay | Admitting: Internal Medicine

## 2014-05-14 ENCOUNTER — Ambulatory Visit (INDEPENDENT_AMBULATORY_CARE_PROVIDER_SITE_OTHER): Payer: Medicare Other | Admitting: Internal Medicine

## 2014-05-14 VITALS — BP 140/78 | Temp 98.0°F | Ht 61.5 in | Wt 173.2 lb

## 2014-05-14 DIAGNOSIS — I868 Varicose veins of other specified sites: Secondary | ICD-10-CM | POA: Diagnosis not present

## 2014-05-14 DIAGNOSIS — M7989 Other specified soft tissue disorders: Secondary | ICD-10-CM | POA: Diagnosis not present

## 2014-05-14 DIAGNOSIS — I10 Essential (primary) hypertension: Secondary | ICD-10-CM

## 2014-05-14 DIAGNOSIS — I8390 Asymptomatic varicose veins of unspecified lower extremity: Secondary | ICD-10-CM | POA: Insufficient documentation

## 2014-05-14 DIAGNOSIS — R21 Rash and other nonspecific skin eruption: Secondary | ICD-10-CM

## 2014-05-14 DIAGNOSIS — L4050 Arthropathic psoriasis, unspecified: Secondary | ICD-10-CM

## 2014-05-14 NOTE — Progress Notes (Signed)
Pre visit review using our clinic review tool, if applicable. No additional management support is needed unless otherwise documented below in the visit note.  Chief Complaint  Patient presents with  . Follow-up    HPI: Michelle [name] 74 y.o. comesin for Chronic disease management  Intensification of HT rx at last visit . Now on 1/2 chlorthalidone  12.5  bp readings  home 120- 140 range    Has wrist machine .  Up in doctors office  Legs swell some  Anterior shin rash felt  to be blood vessel by rheumatologist .  ? If off diuretic causing this  Has some vv no ulcerations   exdercises at the gyme No cp sob new meds  ROS: See pertinent positives and negatives per HPI.no cp sob syncope  Had labs done rheum last week pending here  Past Medical History  Diagnosis Date  . Hypertension   . Arthritis     psoriatic--Dr.Beekman  . Osteopenia     Dr.John McComb  . Hyperglycemia     Family History  Problem Relation Age of Onset  . Kidney cancer Father     prostate   . Hypertension Mother     died at age 72   . Autoimmune disease Mother     Hemolytic Anemia  . Breast cancer Maternal Aunt   . Hypertension Maternal Aunt   . Stroke Maternal Grandfather 11  . Diabetes Neg Hx   . Heart disease Neg Hx   . Osteoporosis      Mother, 2 M aunts , MGM  . Atrial fibrillation Daughter 26    History   Social History  . Marital Status: Widowed    Spouse Name: N/A  . Number of Children: N/A  . Years of Education: N/A   Occupational History  . Retired    Social History Main Topics  . Smoking status: Never Smoker   . Smokeless tobacco: Never Used  . Alcohol Use: No  . Drug Use: No  . Sexual Activity: Not on file   Other Topics Concern  . 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     Childbirth x3    Outpatient Encounter Prescriptions as of 05/14/2014  Medication Sig  . Ascorbic Acid (VITAMIN C PO) Take 1 tablet by mouth daily.  . Calcium Carbonate (CALCIUM 600 PO) Take 1 tablet by mouth daily.  . chlorthalidone (HYGROTON) 25 MG tablet TAKE 1/2-1 TABLET BY MOUTH EVERY DAY AS DIRECTED  . Cholecalciferol (VITAMIN D) 2000 UNITS CAPS Take 1 capsule by mouth daily.   . folic acid (FOLVITE) 1 MG tablet Take 1 mg by mouth daily.    Marland Kitchen losartan (COZAAR) 100 MG tablet Take 1 tablet (100 mg total) by mouth daily.  . methotrexate (RHEUMATREX) 2.5 MG tablet Take 15 mg by mouth once a week. Caution:Chemotherapy. Protect from light. TAKES ON SAT  . metoprolol succinate (TOPROL-XL) 50 MG 24 hr tablet TAKE 1 TABLET BY MOUTH EVERY DAY  . Multiple Vitamin (MULTIVITAMIN) tablet Take 1 tablet by mouth daily.  . vitamin C (ASCORBIC ACID) 500 MG tablet Take 500 mg by mouth daily.    EXAM:  BP 140/78  mmHg  Temp(Src) 98 F (36.7 C) (Oral)  Ht 5' 1.5" (1.562 m)  Wt 173 lb 3.2 oz (78.563 kg)  BMI 32.20 kg/m2  LMP 04/19/1997  Body mass index is 32.2 kg/(m^2).  GENERAL: vitals reviewed and listed above, alert, oriented, appears well hydrated and in no acute distress HEENT: atraumatic, conjunctiva  clear, no obvious abnormalities on inspection of external nose and ears NECK: no obvious masses on inspection palpation  LUNGS: clear to auscultation bilaterally, no wheezes, rales or rhonchi, good air movement CV: HRRR, no clubbing cyanosis or   nl cap refill   Lefts 1 + edema some VV no ulcers  anterior shin rash looks poss  hydrostatic capillary changes  No acute joint swelling MS: moves all extremities without noticeable focal  abnormality PSYCH: pleasant and cooperative, no obvious depression or anxiety Lab Results  Component Value Date   WBC 7.0 12/06/2012   HGB 14.3 12/06/2012   HCT 42.1 12/06/2012   PLT 326.0 12/06/2012   GLUCOSE 94 04/30/2014   CHOL 145 12/06/2012   TRIG 63.0  12/06/2012   HDL 59.80 12/06/2012   LDLCALC 73 12/06/2012   ALT 16 12/06/2012   AST 24 12/06/2012   NA 135 04/30/2014   K 4.0 04/30/2014   CL 98 04/30/2014   CREATININE 0.72 04/30/2014   BUN 15 04/30/2014   CO2 35* 04/30/2014   TSH 1.39 12/06/2012   HGBA1C 5.4 09/06/2007  147/91   146/90 office    135/80     140/78 office   ASSESSMENT AND PLAN:  Discussed the following assessment and plan:  Essential hypertension - machine correlates with office cuff spike at office but mostly controlled 120 - 130 range   Leg swelling - worse off hctz now on chlorthalidone  VV noted   - Plan: Ambulatory referral to Vascular Surgery  Rash and nonspecific skin eruption - not felt to be psoriasis poss frmo edema?  Varicose veins - Plan: Ambulatory referral to Vascular Surgery Total visit 78mins > 50% spent counseling and coordinating care    disc bp and various readings   Various causes of  Edema most common   Seems doing well otherwise  -Patient advised to return or notify health care team  if symptoms worsen ,persist or new concerns arise.  Patient Instructions   Will arrange  Referral  Vascular for the swelling in legs as we discussed . Add compression stockings  Can get at medical supply or sports   Possible   For  the swelling  Rash could be from the  Excess swelling .  Stay on same med for now  bp readings correlate with office reading  and are at goal at this time.  Wellness visit  In 6 months  Otherwise    Standley Brooking. Panosh M.D.

## 2014-05-14 NOTE — Patient Instructions (Signed)
  Will arrange  Referral  Vascular for the swelling in legs as we discussed . Add compression stockings  Can get at medical supply or sports   Possible   For  the swelling  Rash could be from the  Excess swelling .  Stay on same med for now  bp readings correlate with office reading  and are at goal at this time.  Wellness visit  In 6 months  Otherwise

## 2014-05-30 ENCOUNTER — Other Ambulatory Visit: Payer: Self-pay | Admitting: *Deleted

## 2014-05-30 DIAGNOSIS — I8390 Asymptomatic varicose veins of unspecified lower extremity: Secondary | ICD-10-CM

## 2014-06-18 ENCOUNTER — Other Ambulatory Visit: Payer: Self-pay | Admitting: Internal Medicine

## 2014-06-18 ENCOUNTER — Ambulatory Visit: Payer: Medicare Other | Admitting: Internal Medicine

## 2014-06-20 ENCOUNTER — Other Ambulatory Visit: Payer: Self-pay | Admitting: Internal Medicine

## 2014-06-29 ENCOUNTER — Encounter: Payer: Self-pay | Admitting: Vascular Surgery

## 2014-07-04 ENCOUNTER — Ambulatory Visit (INDEPENDENT_AMBULATORY_CARE_PROVIDER_SITE_OTHER): Payer: Medicare Other | Admitting: Vascular Surgery

## 2014-07-04 ENCOUNTER — Encounter: Payer: Self-pay | Admitting: Vascular Surgery

## 2014-07-04 ENCOUNTER — Ambulatory Visit (HOSPITAL_COMMUNITY)
Admission: RE | Admit: 2014-07-04 | Discharge: 2014-07-04 | Disposition: A | Discharge status: Home / Self Care | Payer: Medicare Other | Source: Ambulatory Visit | Attending: Vascular Surgery | Admitting: Vascular Surgery

## 2014-07-04 VITALS — BP 127/85 | HR 62 | Resp 18 | Ht 62.25 in | Wt 172.9 lb

## 2014-07-04 DIAGNOSIS — I872 Venous insufficiency (chronic) (peripheral): Secondary | ICD-10-CM

## 2014-07-04 DIAGNOSIS — I868 Varicose veins of other specified sites: Secondary | ICD-10-CM

## 2014-07-04 DIAGNOSIS — I8393 Asymptomatic varicose veins of bilateral lower extremities: Secondary | ICD-10-CM | POA: Insufficient documentation

## 2014-07-04 DIAGNOSIS — I8390 Asymptomatic varicose veins of unspecified lower extremity: Secondary | ICD-10-CM

## 2014-07-04 NOTE — Progress Notes (Signed)
[facility] [place]  Patient name: Michelle [name] MRN: [id number] DOB: 1940-10-21 Sex: female  REASON FOR CONSULT: bilateral varicose veins. Referred by Dr. Regis Bill  HPI: Michelle [name] is a 74 y.o. female who has had problems with leg swelling and was noted to have some varicose veins. For this reason she was sent for vascular consultation. He experiences some aching pain and heaviness in both lower extremities which is aggravated by standing and sitting and relieved somewhat with elevation. She is unaware of any history of DVT or phlebitis. The swelling in her legs worsened after her diuretics  were held earlier this year.  I have reviewed her records that were sent with her from [place] [facility]. She has a history of essential hypertension which has been well controlled. She has had some leg swelling and given that she had varicose veins she was sent for vascular evaluation. She has some rash also in the legs that is being followed.  She has no family history of varicose veins.  Past Medical History  Diagnosis Date  . Hypertension   . Arthritis     psoriatic--Dr.Beekman  . Osteopenia     Dr.John McComb   Family History  Problem Relation Age of Onset  . Kidney cancer Father     prostate   . Cancer Father   . Hypertension Mother     died at age 44   . Autoimmune disease Mother     Hemolytic Anemia  . Breast cancer Maternal Aunt   . Hypertension Maternal Aunt   . Stroke Maternal Grandfather 4  . Diabetes Neg Hx   . Heart disease Neg Hx   . Osteoporosis      Mother, 2 M aunts , MGM  . Atrial fibrillation Daughter 47   SOCIAL HISTORY: History  Substance Use Topics  . Smoking status: Never Smoker   . Smokeless tobacco: Never Used  . Alcohol Use: No   No Known Allergies Current Outpatient Prescriptions  Medication Sig Dispense Refill  . Ascorbic Acid (VITAMIN C PO) Take 1 tablet by mouth daily.    . chlorthalidone (HYGROTON) 25 MG tablet TAKE  1/2-1 TABLET BY MOUTH EVERY DAY AS DIRECTED 90 tablet 0  . Cholecalciferol (VITAMIN D) 2000 UNITS CAPS Take 1 capsule by mouth daily.     . folic acid (FOLVITE) 1 MG tablet Take 1 mg by mouth daily.      Marland Kitchen losartan (COZAAR) 100 MG tablet TAKE 1 TABLET BY MOUTH EVERY DAY 90 tablet 3  . methotrexate (RHEUMATREX) 2.5 MG tablet Take 15 mg by mouth once a week. Caution:Chemotherapy. Protect from light. TAKES ON SAT    . metoprolol succinate (TOPROL-XL) 50 MG 24 hr tablet TAKE 1 TABLET BY MOUTH EVERY DAY 90 tablet 1  . Multiple Vitamin (MULTIVITAMIN) tablet Take 1 tablet by mouth daily.    . vitamin C (ASCORBIC ACID) 500 MG tablet Take 500 mg by mouth daily.    . Calcium Carbonate (CALCIUM 600 PO) Take 1 tablet by mouth daily.     No current facility-administered medications for this visit.   REVIEW OF SYSTEMS: Valu.Nieves ] denotes positive finding; [  ] denotes negative finding  CARDIOVASCULAR:  [ ]  chest pain   [ ]  chest pressure   [ ]  palpitations   [ ]  orthopnea   [ ]  dyspnea on exertion   [ ]  claudication   [ ]  rest pain   [ ]  DVT   [ ]   phlebitis PULMONARY:   [ ]  productive cough   [ ]  asthma   [ ]  wheezing NEUROLOGIC:   [ ]  weakness  [ ]  paresthesias  [ ]  aphasia  [ ]  amaurosis  [ ]  dizziness HEMATOLOGIC:   [ ]  bleeding problems   [ ]  clotting disorders MUSCULOSKELETAL:  [ ]  joint pain   [ ]  joint swelling Valu.Nieves ] leg swelling GASTROINTESTINAL: [ ]   blood in stool  [ ]   hematemesis GENITOURINARY:  [ ]   dysuria  [ ]   hematuria PSYCHIATRIC:  [ ]  history of major depression INTEGUMENTARY:  [ ]  rashes  [ ]  ulcers CONSTITUTIONAL:  [ ]  fever   [ ]  chills  PHYSICAL EXAM: Filed Vitals:   07/04/14 1301  BP: 127/85  Pulse: 62  Resp: 18  Height: 5' 2.25" (1.581 m)  Weight: 172 lb 14.4 oz (78.427 kg)   GENERAL: The patient is a well-nourished female, in no acute distress. The vital signs are documented above. CARDIOVASCULAR: There is a regular rate and rhythm. I do not detect carotid bruits. She has  palpable pedal pulses bilaterally. She has mild bilateral lower extremity swelling. PULMONARY: There is good air exchange bilaterally without wheezing or rales. ABDOMEN: Soft and non-tender with normal pitched bowel sounds.  MUSCULOSKELETAL: There are no major deformities or cyanosis. NEUROLOGIC: No focal weakness or paresthesias are detected. SKIN: There are no ulcers or rashes noted. She does have some slight hyperpigmentation bilaterally consistent with chronic venous insufficiency. She has telangiectasias in both lower extremities below the knee. PSYCHIATRIC: The patient has a normal affect.  DATA:  I have independently interpreted her venous duplex scan. On the right side there is no evidence of DVT. There is reflux in the right common femoral vein and popliteal vein and saphenofemoral junction. There is no reflux in the right great saphenous vein. On the left side there is no evidence of DVT. There is reflux in the left common femoral vein, popliteal vein, and saphenofemoral junction. There is no reflux in the right great saphenous vein.  MEDICAL ISSUES:  CHRONIC VENOUS INSUFFICIENCY: Based on her venous duplex scan, she does have deep vein reflux bilaterally. We have discussed the importance of intermittent leg elevation in the proper positioning for this. In addition I have written her a prescription for knee-high compression stockings with a gradient of 15-20 mmHg. I've encouraged her to ambulate and exercise as much as possible. We discussed potentially doing water aerobics which is also helpful for patients with venous disease. I encouraged her to try to avoid prolonged sitting and standing. I'll be happy to see her back at any time if her vein problems progress.   Deitra Mayo [facility] [place]: [phone]

## 2014-07-05 ENCOUNTER — Telehealth: Payer: Self-pay

## 2014-07-05 NOTE — Telephone Encounter (Signed)
Left a message for pt to return call about scheduling a mammogram.

## 2014-07-13 DIAGNOSIS — L4050 Arthropathic psoriasis, unspecified: Secondary | ICD-10-CM | POA: Diagnosis not present

## 2014-07-16 ENCOUNTER — Other Ambulatory Visit: Payer: Self-pay | Admitting: Internal Medicine

## 2014-07-16 NOTE — Telephone Encounter (Signed)
Losartan filled for 1 year on 06/20/14 Chlorthalidone filled for 6 months today Pt has upcoming wellness scheduled on 11/13/14

## 2014-07-31 ENCOUNTER — Other Ambulatory Visit: Payer: Self-pay | Admitting: Internal Medicine

## 2014-08-01 NOTE — Telephone Encounter (Signed)
Denied.  Filled on 06/20/14 for one year.  Request is too soon.

## 2014-08-27 ENCOUNTER — Encounter: Payer: Self-pay | Admitting: *Deleted

## 2014-11-07 DIAGNOSIS — M5136 Other intervertebral disc degeneration, lumbar region: Secondary | ICD-10-CM | POA: Diagnosis not present

## 2014-11-07 DIAGNOSIS — Z79899 Other long term (current) drug therapy: Secondary | ICD-10-CM | POA: Diagnosis not present

## 2014-11-07 DIAGNOSIS — L4050 Arthropathic psoriasis, unspecified: Secondary | ICD-10-CM | POA: Diagnosis not present

## 2014-11-07 DIAGNOSIS — Z23 Encounter for immunization: Secondary | ICD-10-CM | POA: Diagnosis not present

## 2014-11-13 ENCOUNTER — Ambulatory Visit (INDEPENDENT_AMBULATORY_CARE_PROVIDER_SITE_OTHER): Payer: Medicare Other | Admitting: Internal Medicine

## 2014-11-13 ENCOUNTER — Encounter: Payer: Self-pay | Admitting: Internal Medicine

## 2014-11-13 VITALS — BP 130/80 | Temp 97.9°F | Ht 62.0 in | Wt 176.0 lb

## 2014-11-13 DIAGNOSIS — Z79899 Other long term (current) drug therapy: Secondary | ICD-10-CM

## 2014-11-13 DIAGNOSIS — L4050 Arthropathic psoriasis, unspecified: Secondary | ICD-10-CM

## 2014-11-13 DIAGNOSIS — Z0000 Encounter for general adult medical examination without abnormal findings: Secondary | ICD-10-CM | POA: Diagnosis not present

## 2014-11-13 DIAGNOSIS — I10 Essential (primary) hypertension: Secondary | ICD-10-CM | POA: Diagnosis not present

## 2014-11-13 DIAGNOSIS — Z23 Encounter for immunization: Secondary | ICD-10-CM

## 2014-11-13 LAB — CBC WITH DIFFERENTIAL/PLATELET
BASOS ABS: 0.1 10*3/uL (ref 0.0–0.1)
Basophils Relative: 0.7 % (ref 0.0–3.0)
EOS ABS: 0.3 10*3/uL (ref 0.0–0.7)
Eosinophils Relative: 4.1 % (ref 0.0–5.0)
HCT: 42.8 % (ref 36.0–46.0)
Hemoglobin: 14.3 g/dL (ref 12.0–15.0)
LYMPHS ABS: 2.5 10*3/uL (ref 0.7–4.0)
Lymphocytes Relative: 33.1 % (ref 12.0–46.0)
MCHC: 33.4 g/dL (ref 30.0–36.0)
MCV: 91.9 fl (ref 78.0–100.0)
MONOS PCT: 9.8 % (ref 3.0–12.0)
Monocytes Absolute: 0.7 10*3/uL (ref 0.1–1.0)
Neutro Abs: 3.9 10*3/uL (ref 1.4–7.7)
Neutrophils Relative %: 52.3 % (ref 43.0–77.0)
Platelets: 326 10*3/uL (ref 150.0–400.0)
RBC: 4.66 Mil/uL (ref 3.87–5.11)
RDW: 13.8 % (ref 11.5–15.5)
WBC: 7.6 10*3/uL (ref 4.0–10.5)

## 2014-11-13 LAB — HEPATIC FUNCTION PANEL
ALK PHOS: 83 U/L (ref 39–117)
ALT: 17 U/L (ref 0–35)
AST: 22 U/L (ref 0–37)
Albumin: 4 g/dL (ref 3.5–5.2)
Bilirubin, Direct: 0.1 mg/dL (ref 0.0–0.3)
Total Bilirubin: 0.6 mg/dL (ref 0.2–1.2)
Total Protein: 7.3 g/dL (ref 6.0–8.3)

## 2014-11-13 LAB — LIPID PANEL
Cholesterol: 157 mg/dL (ref 0–200)
HDL: 50.3 mg/dL (ref >39.00)
LDL Cholesterol: 78 mg/dL (ref 0–99)
NONHDL: 106.84
Total CHOL/HDL Ratio: 3
Triglycerides: 146 mg/dL (ref 0.0–149.0)
VLDL: 29.2 mg/dL (ref 0.0–40.0)

## 2014-11-13 LAB — BASIC METABOLIC PANEL
BUN: 13 mg/dL (ref 6–23)
CO2: 31 meq/L (ref 19–32)
CREATININE: 0.66 mg/dL (ref 0.40–1.20)
Calcium: 9.9 mg/dL (ref 8.4–10.5)
Chloride: 102 mEq/L (ref 96–112)
GFR: 93.17 mL/min (ref >60.00)
GLUCOSE: 90 mg/dL (ref 70–99)
Potassium: 4.4 mEq/L (ref 3.5–5.1)
Sodium: 140 mEq/L (ref 135–145)

## 2014-11-13 LAB — TSH: TSH: 1.12 u[IU]/mL (ref 0.35–4.50)

## 2014-11-13 NOTE — Progress Notes (Signed)
Pre visit review using our clinic review tool, if applicable. No additional management support is needed unless otherwise documented below in the visit note.  Chief Complaint  Patient presents with  . Medicare Wellness    HPI: Michelle [name] 74 y.o. comes in today for Preventive Medicare wellness visit .Since last visit.also Chronic disease management HT: 136/76 at rheum. No meds thia am.  PS ARTHRITIS   q 4 months  labss   Arthritis is controlled  Saw vein  specialist wearing special socks has been home. Has problems is active had circulation checked and says was okay. No cardiovascular pulmonary symptoms at this time. No numbness.  Health Maintenance  Topic Date Due  . ZOSTAVAX  04/30/2001  . DEXA SCAN  05/01/2006  . COLONOSCOPY  02/03/2015  . MAMMOGRAM  02/08/2015  . INFLUENZA VACCINE  09/03/2015  . TETANUS/TDAP  02/05/2016  . PNA vac Low Risk Adult  Completed   Health Maintenance Review LIFESTYLE:  TAD no CT scan document Sugar beverages: iced tea some sweet  ocass .  Sleep: 8-9 hours  dexa improved  Not needed for 5 years .  Per dr lomax  does yoga  Adult fitness  MEDICARE DOCUMENT QUESTIONS  TO SCAN     Hearing: Okay  Vision:  No limitations at present . Last eye check UTD sees Dr. Claudean Kinds has classes had surgery  Safety:  Has smoke detector and wears seat belts.  No firearms. No excess sun exposure. Sees dentist regularly.  Falls: No  Advance directive :  Reviewed  Has one.  Memory: Felt to be good  , no concern from her or her family.  Depression: No anhedonia unusual crying or depressive symptoms  Nutrition: Eats well balanced diet; adequate calcium and vitamin D. No swallowing chewing problems.  Injury: no major injuries in the last six months.  Other healthcare providers:  Reviewed today .  Social:  Lives alone No pets. He is independent extended family children live in [place] and [place]  Preventive parameters: up-to-date  Reviewed    ADLS:   There are no problems or need for assistance  driving, feeding, obtaining food, dressing, toileting and bathing, managing money using phone. She is independent.    ROS: See history of present illness psoriatic arthritis. States that her osteopenia is a bit better no bone density needed for 5 years sees Dr. Radene Knee GEN/ HEENT: No fever, significant weight changes sweats headaches vision problems hearing changes, CV/ PULM; No chest pain shortness of breath cough, syncope,edema  change in exercise tolerance. GI /GU: No adominal pain, vomiting, change in bowel habits. No blood in the stool. No significant GU symptoms. SKIN/HEME: ,no acute skin rashes suspicious lesions or bleeding. No lymphadenopathy, nodules, masses.  NEURO/ PSYCH:  No neurologic signs such as weakness numbness. No depression anxiety. IMM/ Allergy: No unusual infections.  Allergy .   REST of 12 system review negative except as per HPI   Past Medical History  Diagnosis Date  . Hypertension   . Arthritis     psoriatic--Dr.Beekman  . Osteopenia     Dr.John McComb    Family History  Problem Relation Age of Onset  . Kidney cancer Father     prostate   . Cancer Father   . Hypertension Mother     died at age 32   . Autoimmune disease Mother     Hemolytic Anemia  . Breast cancer Maternal Aunt   . Hypertension Maternal Aunt   . Stroke  Maternal Grandfather 29  . Diabetes Neg Hx   . Heart disease Neg Hx   . Osteoporosis      Mother, 2 M aunts , MGM  . Atrial fibrillation Daughter 43    Social History   Social History  . Marital Status: Widowed    Spouse Name: N/A  . Number of Children: N/A  . Years of Education: N/A   Occupational History  . Retired    Social History Main Topics  . Smoking status: Never Smoker   . Smokeless tobacco: Never Used  . Alcohol Use: No  . Drug Use: No  . Sexual Activity: Not Asked   Other Topics Concern  . 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   Childbirth x3    Outpatient Encounter Prescriptions as of 11/13/2014  Medication Sig  . Calcium Carbonate (CALCIUM 600 PO) Take 1 tablet by mouth daily.  . chlorthalidone (HYGROTON) 25 MG tablet TAKE 1/2-1 TABLET BY MOUTH EVERY DAY AS DIRECTED  . Cholecalciferol (VITAMIN D) 2000 UNITS CAPS Take 1 capsule by mouth daily.   . folic acid (FOLVITE) 1 MG tablet Take 1 mg by mouth daily.    Marland Kitchen losartan (COZAAR) 100 MG tablet TAKE 1 TABLET BY MOUTH EVERY DAY  . methotrexate (RHEUMATREX) 2.5 MG tablet Take 15 mg by mouth once a week. Caution:Chemotherapy. Protect from light. TAKES ON SAT  . metoprolol succinate (TOPROL-XL) 50 MG 24 hr tablet TAKE 1 TABLET BY MOUTH EVERY DAY  . Multiple Vitamin (MULTIVITAMIN) tablet Take 1 tablet by mouth daily.  . vitamin C (ASCORBIC ACID) 500 MG tablet Take 500 mg by mouth daily.  . [DISCONTINUED] Ascorbic Acid (VITAMIN C PO) Take 1 tablet by mouth daily.   No facility-administered encounter medications on file as of 11/13/2014.    EXAM:  BP 130/80 mmHg  Temp(Src) 97.9 F (36.6 C) (Oral)  Ht 5' 2" (1.575 m)  Wt 176 lb (79.833 kg)  BMI 32.18 kg/m2  LMP 04/19/1997  Body mass index is 32.18 kg/(m^2).  Physical Exam: Vital signs reviewed RCB:ULAG is a well-developed well-nourished alert cooperative   who appears stated age in no acute distress.  HEENT: normocephalic atraumatic , Eyes: PERRL EOM's full, conjunctiva clear, Nares: paten,t no deformity discharge or tenderness., Ears: no deformity EAC's clear TMs with normal landmarks. Mouth: clear OP, no lesions, edema.  Moist mucous membranes. Dentition in adequate repair. NECK: supple without masses, thyromegaly or bruits. CHEST/PULM:  Clear to auscultation and percussion breath sounds equal no wheeze , rales or  rhonchi. No chest wall deformities or tenderness.Breast: normal by inspection . No dimpling, discharge, masses, tenderness or discharge . CV: PMI is nondisplaced, S1 S2 no gallops, murmurs, rubs. Peripheral pulses are full without delay.No JVD .  ABDOMEN: Bowel sounds normal nontender  No guard or rebound, no hepato splenomegal no CVA tenderness.   Extremtities:  No clubbing cyanosis or edema, no acute joint swelling or redness no focal atrophy has vv no ulceration  Acute findings  NEURO:  Oriented x3, cranial nerves 3-12 appear to be intact, no obvious focal weakness,gait within normal limits no abnormal reflexes or asymmetrical SKIN: No acute rashes normal turgor,  color, no bruising or petechiae. PSYCH: Oriented, good eye contact, no obvious depression anxiety, cognition and judgment appear normal. LN: no cervical axillary inguinal adenopathy No noted deficits in memory, attention, and speech.   Lab Results  Component Value Date   WBC 7.0 12/06/2012   HGB 14.3 12/06/2012   HCT 42.1 12/06/2012   PLT 326.0 12/06/2012   GLUCOSE 94 04/30/2014   CHOL 145 12/06/2012   TRIG 63.0 12/06/2012   HDL 59.80 12/06/2012   LDLCALC 73 12/06/2012   ALT 16 12/06/2012   AST 24 12/06/2012   NA 135 04/30/2014   K 4.0 04/30/2014   CL 98 04/30/2014   CREATININE 0.72 04/30/2014   BUN 15 04/30/2014   CO2 35* 04/30/2014   TSH 1.39 12/06/2012   HGBA1C 5.4 09/06/2007   BP Readings from Last 3 Encounters:  11/13/14 130/80  07/04/14 127/85  05/14/14 140/78    ASSESSMENT AND PLAN:  Discussed the following assessment and plan:  Medicare annual wellness visit, subsequent  Essential hypertension - cont check reading to make surea t goal  - Plan: Basic metabolic panel, CBC with Differential/Platelet, Hepatic function panel, Lipid panel, TSH  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[place]) - Plan: Basic metabolic panel, CBC with Differential/Platelet, Hepatic function panel, Lipid panel, TSH  Medication management  Need  for vaccination with 13-polyvalent pneumococcal conjugate vaccine - Plan: Pneumococcal conjugate vaccine 13-valent reviewed HCM  Patient Care Team: Burnis Medin, MD as PCP - General (Internal Medicine) Hennie Duos, MD as Consulting Physician (Rheumatology) Arvella Nigh, MD as Consulting Physician (Obstetrics and Gynecology) Ralene Bathe, MD as Consulting Physician (Ophthalmology)  Patient Instructions  yearly flu vaccine  Make sure bp readings at goal at home   Continue exercise  For bone , cv and  Balance health  If all ok then yearly wellness cpx with labs  Please have yhour rheumatologist send [place] notes and their evaluations    Health Maintenance, Female Adopting a healthy lifestyle and getting preventive care can go a long way to promote health and wellness. Talk with your health care provider about what schedule of regular examinations is right for you. This is a good chance for you to check in with your provider about disease prevention and staying healthy. In between checkups, there are plenty of things you can do on your own. Experts have done a lot of research about which lifestyle changes and preventive measures are most likely to keep you healthy. Ask your health care provider for more information. WEIGHT AND DIET  Eat a healthy diet  Be sure to include plenty of vegetables, fruits, low-fat dairy products, and lean protein.  Do not eat a lot of foods high in solid fats, added sugars, or salt.  Get regular exercise. This is one of the most important things you can do for your health.  Most adults should exercise for at least 150 minutes each week. The exercise should increase your heart rate and make you sweat (moderate-intensity exercise).  Most adults should also do strengthening exercises at least twice a week. This is in addition to the moderate-intensity exercise.  Maintain a healthy weight  Body mass index (BMI) is a measurement that can be used to identify  possible weight problems. It estimates body fat based on height and weight. Your health care provider can help determine your BMI and help you achieve or maintain a healthy weight.  For females 73 years of age and older:   A BMI below 18.5 is considered underweight.  A BMI of 18.5 to 24.9 is normal.  A BMI of 25 to 29.9 is considered overweight.  A BMI of 30 and above is considered obese.  Watch levels of cholesterol and blood lipids  You should start having your blood tested for lipids and cholesterol at 73 years of age, then have this test every 5 years.  You may need to have your cholesterol levels checked more often if:  Your lipid or cholesterol levels are high.  You are older than 73 years of age.  You are at high risk for heart disease.  CANCER SCREENING   Lung Cancer  Lung cancer screening is recommended for adults 8-71 years old who are at high risk for lung cancer because of a history of smoking.  A yearly low-dose CT scan of the lungs is recommended for people who:  Currently smoke.  Have quit within the past 15 years.  Have at least a 30-pack-year history of smoking. A pack year is smoking an average of one pack of cigarettes a day for 1 year.  Yearly screening should continue until it has been 15 years since you quit.  Yearly screening should stop if you develop a health problem that would prevent you from having lung cancer treatment.  Breast Cancer  Practice breast self-awareness. This means understanding how your breasts normally appear and feel.  It also means doing regular breast self-exams. Let your health care provider know about any changes, no matter how small.  If you are in your 20s or 30s, you should have a clinical breast exam (CBE) by a health care provider every 1-3 years as part of a regular health exam.  If you are 68 or older, have a CBE every year. Also consider having a breast X-ray (mammogram) every year.  If you have a family  history of breast cancer, talk to your health care provider about genetic screening.  If you are at high risk for breast cancer, talk to your health care provider about having an MRI and a mammogram every year.  Breast cancer gene (BRCA) assessment is recommended for women who have family members with BRCA-related cancers. BRCA-related cancers include:  Breast.  Ovarian.  Tubal.  Peritoneal cancers.  Results of the assessment will determine the need for genetic counseling and BRCA1 and BRCA2 testing. Cervical Cancer Your health care provider may recommend that you be screened regularly for cancer of the pelvic organs (ovaries, uterus, and vagina). This screening involves a pelvic examination, including checking for microscopic changes to the surface of your cervix (Pap test). You may be encouraged to have this screening done every 3 years, beginning at age 24.  For women ages 60-65, health care providers may recommend pelvic exams and Pap testing every 3 years, or they may recommend the Pap and pelvic exam, combined with testing for human papilloma virus (HPV), every 5 years. Some types of HPV increase your risk of cervical cancer. Testing for HPV may also be done on women of any age with unclear Pap test results.  Other health care providers may not recommend any screening for nonpregnant women who are considered low risk for pelvic cancer and who do not have symptoms. Ask your health care provider if a screening pelvic exam is right for you.  If you have had past treatment for cervical cancer or a condition that could lead to cancer, you need Pap tests and screening for cancer for at least 20 years after your treatment. If Pap tests have  been discontinued, your risk factors (such as having a new sexual partner) need to be reassessed to determine if screening should resume. Some women have medical problems that increase the chance of getting cervical cancer. In these cases, your health care  provider may recommend more frequent screening and Pap tests. Colorectal Cancer  This type of cancer can be detected and often prevented.  Routine colorectal cancer screening usually begins at 73 years of age and continues through 73 years of age.  Your health care provider may recommend screening at an earlier age if you have risk factors for colon cancer.  Your health care provider may also recommend using home test kits to check for hidden blood in the stool.  A small camera at the end of a tube can be used to examine your colon directly (sigmoidoscopy or colonoscopy). This is done to check for the earliest forms of colorectal cancer.  Routine screening usually begins at age 81.  Direct examination of the colon should be repeated every 5-10 years through 73 years of age. However, you may need to be screened more often if early forms of precancerous polyps or small growths are found. Skin Cancer  Check your skin from head to toe regularly.  Tell your health care provider about any new moles or changes in moles, especially if there is a change in a mole's shape or color.  Also tell your health care provider if you have a mole that is larger than the size of a pencil eraser.  Always use sunscreen. Apply sunscreen liberally and repeatedly throughout the day.  Protect yourself by wearing long sleeves, pants, a wide-brimmed hat, and sunglasses whenever you are outside. HEART DISEASE, DIABETES, AND HIGH BLOOD PRESSURE   High blood pressure causes heart disease and increases the risk of stroke. High blood pressure is more likely to develop in:  People who have blood pressure in the high end of the normal range (130-139/85-89 mm Hg).  People who are overweight or obese.  People who are African American.  If you are 75-70 years of age, have your blood pressure checked every 3-5 years. If you are 77 years of age or older, have your blood pressure checked every year. You should have your  blood pressure measured twice--once when you are at a hospital or clinic, and once when you are not at a hospital or clinic. Record the average of the two measurements. To check your blood pressure when you are not at a hospital or clinic, you can use:  An automated blood pressure machine at a pharmacy.  A home blood pressure monitor.  If you are between 65 years and 74 years old, ask your health care provider if you should take aspirin to prevent strokes.  Have regular diabetes screenings. This involves taking a blood sample to check your fasting blood sugar level.  If you are at a normal weight and have a low risk for diabetes, have this test once every three years after 73 years of age.  If you are overweight and have a high risk for diabetes, consider being tested at a younger age or more often. PREVENTING INFECTION  Hepatitis B  If you have a higher risk for hepatitis B, you should be screened for this virus. You are considered at high risk for hepatitis B if:  You were born in a country where hepatitis B is common. Ask your health care provider which countries are considered high risk.  Your parents were born in  a high-risk country, and you have not been immunized against hepatitis B (hepatitis B vaccine).  You have HIV or AIDS.  You use needles to inject street drugs.  You live with someone who has hepatitis B.  You have had sex with someone who has hepatitis B.  You get hemodialysis treatment.  You take certain medicines for conditions, including cancer, organ transplantation, and autoimmune conditions. Hepatitis C  Blood testing is recommended for:  Everyone born from [birth year] through 1964.  Anyone with known risk factors for hepatitis C. Sexually transmitted infections (STIs)  You should be screened for sexually transmitted infections (STIs) including gonorrhea and chlamydia if:  You are sexually active and are younger than 73 years of age.  You are older than 73  years of age and your health care provider tells you that you are at risk for this type of infection.  Your sexual activity has changed since you were last screened and you are at an increased risk for chlamydia or gonorrhea. Ask your health care provider if you are at risk.  If you do not have HIV, but are at risk, it may be recommended that you take a prescription medicine daily to prevent HIV infection. This is called pre-exposure prophylaxis (PrEP). You are considered at risk if:  You are sexually active and do not regularly use condoms or know the HIV status of your partner(s).  You take drugs by injection.  You are sexually active with a partner who has HIV. Talk with your health care provider about whether you are at high risk of being infected with HIV. If you choose to begin PrEP, you should first be tested for HIV. You should then be tested every 3 months for as long as you are taking PrEP.  PREGNANCY   If you are premenopausal and you may become pregnant, ask your health care provider about preconception counseling.  If you may become pregnant, take 400 to 800 micrograms (mcg) of folic acid every day.  If you want to prevent pregnancy, talk to your health care provider about birth control (contraception). OSTEOPOROSIS AND MENOPAUSE   Osteoporosis is a disease in which the bones lose minerals and strength with aging. This can result in serious bone fractures. Your risk for osteoporosis can be identified using a bone density scan.  If you are 48 years of age or older, or if you are at risk for osteoporosis and fractures, ask your health care provider if you should be screened.  Ask your health care provider whether you should take a calcium or vitamin D supplement to lower your risk for osteoporosis.  Menopause may have certain physical symptoms and risks.  Hormone replacement therapy may reduce some of these symptoms and risks. Talk to your health care provider about whether  hormone replacement therapy is right for you.  HOME CARE INSTRUCTIONS   Schedule regular health, dental, and eye exams.  Stay current with your immunizations.   Do not use any tobacco products including cigarettes, chewing tobacco, or electronic cigarettes.  If you are pregnant, do not drink alcohol.  If you are breastfeeding, limit how much and how often you drink alcohol.  Limit alcohol intake to no more than 1 drink per day for nonpregnant women. One drink equals 12 ounces of beer, 5 ounces of wine, or 1 ounces of hard liquor.  Do not use street drugs.  Do not share needles.  Ask your health care provider for help if you need support or information  about quitting drugs.  Tell your health care provider if you often feel depressed.  Tell your health care provider if you have ever been abused or do not feel safe at home.   This information is not intended to replace advice given to you by your health care provider. Make sure you discuss any questions you have with your health care provider.   Document Released: 08/04/2010 Document Revised: 02/09/2014 Document Reviewed: 12/21/2012 Elsevier Interactive Patient Education 2016 Roff K. Panosh M.D.

## 2014-11-13 NOTE — Patient Instructions (Addendum)
yearly flu vaccine  Make sure bp readings at goal at home   Continue exercise  For bone , cv and  Balance health  If all ok then yearly wellness cpx with labs  Please have yhour rheumatologist send [place] notes and their evaluations    Health Maintenance, Female Adopting a healthy lifestyle and getting preventive care can go a long way to promote health and wellness. Talk with your health care provider about what schedule of regular examinations is right for you. This is a good chance for you to check in with your provider about disease prevention and staying healthy. In between checkups, there are plenty of things you can do on your own. Experts have done a lot of research about which lifestyle changes and preventive measures are most likely to keep you healthy. Ask your health care provider for more information. WEIGHT AND DIET  Eat a healthy diet  Be sure to include plenty of vegetables, fruits, low-fat dairy products, and lean protein.  Do not eat a lot of foods high in solid fats, added sugars, or salt.  Get regular exercise. This is one of the most important things you can do for your health.  Most adults should exercise for at least 150 minutes each week. The exercise should increase your heart rate and make you sweat (moderate-intensity exercise).  Most adults should also do strengthening exercises at least twice a week. This is in addition to the moderate-intensity exercise.  Maintain a healthy weight  Body mass index (BMI) is a measurement that can be used to identify possible weight problems. It estimates body fat based on height and weight. Your health care provider can help determine your BMI and help you achieve or maintain a healthy weight.  For females 59 years of age and older:   A BMI below 18.5 is considered underweight.  A BMI of 18.5 to 24.9 is normal.  A BMI of 25 to 29.9 is considered overweight.  A BMI of 30 and above is considered obese.  Watch levels of  cholesterol and blood lipids  You should start having your blood tested for lipids and cholesterol at 73 years of age, then have this test every 5 years.  You may need to have your cholesterol levels checked more often if:  Your lipid or cholesterol levels are high.  You are older than 73 years of age.  You are at high risk for heart disease.  CANCER SCREENING   Lung Cancer  Lung cancer screening is recommended for adults 76-46 years old who are at high risk for lung cancer because of a history of smoking.  A yearly low-dose CT scan of the lungs is recommended for people who:  Currently smoke.  Have quit within the past 15 years.  Have at least a 30-pack-year history of smoking. A pack year is smoking an average of one pack of cigarettes a day for 1 year.  Yearly screening should continue until it has been 15 years since you quit.  Yearly screening should stop if you develop a health problem that would prevent you from having lung cancer treatment.  Breast Cancer  Practice breast self-awareness. This means understanding how your breasts normally appear and feel.  It also means doing regular breast self-exams. Let your health care provider know about any changes, no matter how small.  If you are in your 20s or 30s, you should have a clinical breast exam (CBE) by a health care provider every 1-3 years as  part of a regular health exam.  If you are 40 or older, have a CBE every year. Also consider having a breast X-ray (mammogram) every year.  If you have a family history of breast cancer, talk to your health care provider about genetic screening.  If you are at high risk for breast cancer, talk to your health care provider about having an MRI and a mammogram every year.  Breast cancer gene (BRCA) assessment is recommended for women who have family members with BRCA-related cancers. BRCA-related cancers include:  Breast.  Ovarian.  Tubal.  Peritoneal  cancers.  Results of the assessment will determine the need for genetic counseling and BRCA1 and BRCA2 testing. Cervical Cancer Your health care provider may recommend that you be screened regularly for cancer of the pelvic organs (ovaries, uterus, and vagina). This screening involves a pelvic examination, including checking for microscopic changes to the surface of your cervix (Pap test). You may be encouraged to have this screening done every 3 years, beginning at age 70.  For women ages 87-65, health care providers may recommend pelvic exams and Pap testing every 3 years, or they may recommend the Pap and pelvic exam, combined with testing for human papilloma virus (HPV), every 5 years. Some types of HPV increase your risk of cervical cancer. Testing for HPV may also be done on women of any age with unclear Pap test results.  Other health care providers may not recommend any screening for nonpregnant women who are considered low risk for pelvic cancer and who do not have symptoms. Ask your health care provider if a screening pelvic exam is right for you.  If you have had past treatment for cervical cancer or a condition that could lead to cancer, you need Pap tests and screening for cancer for at least 20 years after your treatment. If Pap tests have been discontinued, your risk factors (such as having a new sexual partner) need to be reassessed to determine if screening should resume. Some women have medical problems that increase the chance of getting cervical cancer. In these cases, your health care provider may recommend more frequent screening and Pap tests. Colorectal Cancer  This type of cancer can be detected and often prevented.  Routine colorectal cancer screening usually begins at 73 years of age and continues through 73 years of age.  Your health care provider may recommend screening at an earlier age if you have risk factors for colon cancer.  Your health care provider may also  recommend using home test kits to check for hidden blood in the stool.  A small camera at the end of a tube can be used to examine your colon directly (sigmoidoscopy or colonoscopy). This is done to check for the earliest forms of colorectal cancer.  Routine screening usually begins at age 41.  Direct examination of the colon should be repeated every 5-10 years through 73 years of age. However, you may need to be screened more often if early forms of precancerous polyps or small growths are found. Skin Cancer  Check your skin from head to toe regularly.  Tell your health care provider about any new moles or changes in moles, especially if there is a change in a mole's shape or color.  Also tell your health care provider if you have a mole that is larger than the size of a pencil eraser.  Always use sunscreen. Apply sunscreen liberally and repeatedly throughout the day.  Protect yourself by wearing long sleeves, pants,  a wide-brimmed hat, and sunglasses whenever you are outside. HEART DISEASE, DIABETES, AND HIGH BLOOD PRESSURE   High blood pressure causes heart disease and increases the risk of stroke. High blood pressure is more likely to develop in:  People who have blood pressure in the high end of the normal range (130-139/85-89 mm Hg).  People who are overweight or obese.  People who are African American.  If you are 60-80 years of age, have your blood pressure checked every 3-5 years. If you are 29 years of age or older, have your blood pressure checked every year. You should have your blood pressure measured twice--once when you are at a hospital or clinic, and once when you are not at a hospital or clinic. Record the average of the two measurements. To check your blood pressure when you are not at a hospital or clinic, you can use:  An automated blood pressure machine at a pharmacy.  A home blood pressure monitor.  If you are between 48 years and 52 years old, ask your health  care provider if you should take aspirin to prevent strokes.  Have regular diabetes screenings. This involves taking a blood sample to check your fasting blood sugar level.  If you are at a normal weight and have a low risk for diabetes, have this test once every three years after 73 years of age.  If you are overweight and have a high risk for diabetes, consider being tested at a younger age or more often. PREVENTING INFECTION  Hepatitis B  If you have a higher risk for hepatitis B, you should be screened for this virus. You are considered at high risk for hepatitis B if:  You were born in a country where hepatitis B is common. Ask your health care provider which countries are considered high risk.  Your parents were born in a high-risk country, and you have not been immunized against hepatitis B (hepatitis B vaccine).  You have HIV or AIDS.  You use needles to inject street drugs.  You live with someone who has hepatitis B.  You have had sex with someone who has hepatitis B.  You get hemodialysis treatment.  You take certain medicines for conditions, including cancer, organ transplantation, and autoimmune conditions. Hepatitis C  Blood testing is recommended for:  Everyone born from [birth year] through 1964.  Anyone with known risk factors for hepatitis C. Sexually transmitted infections (STIs)  You should be screened for sexually transmitted infections (STIs) including gonorrhea and chlamydia if:  You are sexually active and are younger than 73 years of age.  You are older than 73 years of age and your health care provider tells you that you are at risk for this type of infection.  Your sexual activity has changed since you were last screened and you are at an increased risk for chlamydia or gonorrhea. Ask your health care provider if you are at risk.  If you do not have HIV, but are at risk, it may be recommended that you take a prescription medicine daily to prevent HIV  infection. This is called pre-exposure prophylaxis (PrEP). You are considered at risk if:  You are sexually active and do not regularly use condoms or know the HIV status of your partner(s).  You take drugs by injection.  You are sexually active with a partner who has HIV. Talk with your health care provider about whether you are at high risk of being infected with HIV. If you choose to begin  PrEP, you should first be tested for HIV. You should then be tested every 3 months for as long as you are taking PrEP.  PREGNANCY   If you are premenopausal and you may become pregnant, ask your health care provider about preconception counseling.  If you may become pregnant, take 400 to 800 micrograms (mcg) of folic acid every day.  If you want to prevent pregnancy, talk to your health care provider about birth control (contraception). OSTEOPOROSIS AND MENOPAUSE   Osteoporosis is a disease in which the bones lose minerals and strength with aging. This can result in serious bone fractures. Your risk for osteoporosis can be identified using a bone density scan.  If you are 1 years of age or older, or if you are at risk for osteoporosis and fractures, ask your health care provider if you should be screened.  Ask your health care provider whether you should take a calcium or vitamin D supplement to lower your risk for osteoporosis.  Menopause may have certain physical symptoms and risks.  Hormone replacement therapy may reduce some of these symptoms and risks. Talk to your health care provider about whether hormone replacement therapy is right for you.  HOME CARE INSTRUCTIONS   Schedule regular health, dental, and eye exams.  Stay current with your immunizations.   Do not use any tobacco products including cigarettes, chewing tobacco, or electronic cigarettes.  If you are pregnant, do not drink alcohol.  If you are breastfeeding, limit how much and how often you drink alcohol.  Limit  alcohol intake to no more than 1 drink per day for nonpregnant women. One drink equals 12 ounces of beer, 5 ounces of wine, or 1 ounces of hard liquor.  Do not use street drugs.  Do not share needles.  Ask your health care provider for help if you need support or information about quitting drugs.  Tell your health care provider if you often feel depressed.  Tell your health care provider if you have ever been abused or do not feel safe at home.   This information is not intended to replace advice given to you by your health care provider. Make sure you discuss any questions you have with your health care provider.   Document Released: 08/04/2010 Document Revised: 02/09/2014 Document Reviewed: 12/21/2012 Elsevier Interactive Patient Education [employer].

## 2014-12-31 ENCOUNTER — Other Ambulatory Visit: Payer: Self-pay | Admitting: Internal Medicine

## 2015-01-02 NOTE — Telephone Encounter (Signed)
Sent to the pharmacy by e-scribe. 

## 2015-02-14 DIAGNOSIS — Z803 Family history of malignant neoplasm of breast: Secondary | ICD-10-CM | POA: Diagnosis not present

## 2015-02-14 DIAGNOSIS — Z1231 Encounter for screening mammogram for malignant neoplasm of breast: Secondary | ICD-10-CM | POA: Diagnosis not present

## 2015-02-26 DIAGNOSIS — I10 Essential (primary) hypertension: Secondary | ICD-10-CM | POA: Diagnosis not present

## 2015-02-26 DIAGNOSIS — M545 Low back pain: Secondary | ICD-10-CM | POA: Diagnosis not present

## 2015-02-26 DIAGNOSIS — Z6832 Body mass index (BMI) 32.0-32.9, adult: Secondary | ICD-10-CM | POA: Diagnosis not present

## 2015-03-12 DIAGNOSIS — L4050 Arthropathic psoriasis, unspecified: Secondary | ICD-10-CM | POA: Diagnosis not present

## 2015-03-12 DIAGNOSIS — Z79899 Other long term (current) drug therapy: Secondary | ICD-10-CM | POA: Diagnosis not present

## 2015-03-12 DIAGNOSIS — M5136 Other intervertebral disc degeneration, lumbar region: Secondary | ICD-10-CM | POA: Diagnosis not present

## 2015-05-07 DIAGNOSIS — Z01419 Encounter for gynecological examination (general) (routine) without abnormal findings: Secondary | ICD-10-CM | POA: Diagnosis not present

## 2015-05-07 DIAGNOSIS — Z6832 Body mass index (BMI) 32.0-32.9, adult: Secondary | ICD-10-CM | POA: Diagnosis not present

## 2015-06-10 DIAGNOSIS — M5136 Other intervertebral disc degeneration, lumbar region: Secondary | ICD-10-CM | POA: Diagnosis not present

## 2015-06-10 DIAGNOSIS — Z79899 Other long term (current) drug therapy: Secondary | ICD-10-CM | POA: Diagnosis not present

## 2015-06-10 DIAGNOSIS — L4050 Arthropathic psoriasis, unspecified: Secondary | ICD-10-CM | POA: Diagnosis not present

## 2015-07-30 ENCOUNTER — Other Ambulatory Visit: Payer: Self-pay | Admitting: Internal Medicine

## 2015-07-31 ENCOUNTER — Telehealth: Payer: Self-pay | Admitting: Family Medicine

## 2015-07-31 ENCOUNTER — Ambulatory Visit (INDEPENDENT_AMBULATORY_CARE_PROVIDER_SITE_OTHER): Payer: Medicare Other | Admitting: Podiatry

## 2015-07-31 ENCOUNTER — Encounter: Payer: Self-pay | Admitting: Podiatry

## 2015-07-31 DIAGNOSIS — M79674 Pain in right toe(s): Secondary | ICD-10-CM

## 2015-07-31 DIAGNOSIS — B351 Tinea unguium: Secondary | ICD-10-CM

## 2015-07-31 NOTE — Progress Notes (Signed)
   Subjective:    Patient ID: Michelle [name], female    DOB: 1941-01-12, 75 y.o.   MRN: [id number]  HPI this patient presents the office with chief complaint of long thick nail on her right big toe. She says that she has had this thick nail for over 2 years. She was referred to my office from a nail salon. He states the nail becomes painful when she wears the wrong shoes. She presents the office today for an evaluation of this great toe and to discuss possible treatment plans    Review of Systems  All other systems reviewed and are negative.      Objective:   Physical Exam GENERAL APPEARANCE: Alert, conversant. Appropriately groomed. No acute distress.  VASCULAR: Pedal pulses are  palpable at  [facility] and PT bilateral.  Capillary refill time is immediate to all digits,  Normal temperature gradient.  Digital hair growth is present bilateral  NEUROLOGIC: sensation is normal to 5.07 monofilament at 5/5 sites bilateral.  Light touch is intact bilateral, Muscle strength normal.  MUSCULOSKELETAL: acceptable muscle strength, tone and stability bilateral.  Intrinsic muscluature intact bilateral.  Rectus appearance of foot and digits noted bilateral.   DERMATOLOGIC: skin color, texture, and turgor are within normal limits.  No preulcerative lesions or ulcers  are seen, no interdigital maceration noted.  No open lesions present.  . No drainage noted.  NAILS  Thick disfigured discolored nail great toe right foot. No signs of infection noted.         Assessment & Plan:  Onychomycosis right hallux.  IE  Discussed conservative vs. Surgical treatment.  Debrided right great hallux toenail. RTC prn   Gardiner Barefoot DPM

## 2015-07-31 NOTE — Telephone Encounter (Signed)
Pt is due for medicare wellness in Oct 2017.  Please help the pt to schedule an appointment.  Thanks!

## 2015-07-31 NOTE — Telephone Encounter (Signed)
Sent to the pharmacy by e-scribe.  Pt due for medicare wellness in Oct 2017.  Message sent to scheduling.

## 2015-07-31 NOTE — Telephone Encounter (Signed)
Pt has been sch

## 2015-08-16 ENCOUNTER — Encounter: Payer: Self-pay | Admitting: Internal Medicine

## 2015-08-16 ENCOUNTER — Ambulatory Visit (INDEPENDENT_AMBULATORY_CARE_PROVIDER_SITE_OTHER): Payer: Medicare Other | Admitting: Internal Medicine

## 2015-08-16 VITALS — BP 164/80 | Temp 98.2°F | Ht 62.0 in | Wt 176.2 lb

## 2015-08-16 DIAGNOSIS — L989 Disorder of the skin and subcutaneous tissue, unspecified: Secondary | ICD-10-CM | POA: Diagnosis not present

## 2015-08-16 DIAGNOSIS — L4050 Arthropathic psoriasis, unspecified: Secondary | ICD-10-CM

## 2015-08-16 DIAGNOSIS — R030 Elevated blood-pressure reading, without diagnosis of hypertension: Secondary | ICD-10-CM | POA: Diagnosis not present

## 2015-08-16 DIAGNOSIS — IMO0001 Reserved for inherently not codable concepts without codable children: Secondary | ICD-10-CM

## 2015-08-16 DIAGNOSIS — D485 Neoplasm of uncertain behavior of skin: Secondary | ICD-10-CM

## 2015-08-16 NOTE — Progress Notes (Signed)
Pre visit review using our clinic review tool, if applicable. No additional management support is needed unless otherwise documented below in the visit note.  Chief Complaint  Patient presents with  . Mole on Rt Leg    HPI: BLIA ALVARDO 75 y.o. comes in for sda appt  Because of lesion on right leg has been growing over the last months  No pain or bleeding .  bp has been okl at her other medical appts  In the 115 range  Under caer for psoriatic arthritis  ROS: See pertinent positives and negatives per HPI.  Past Medical History  Diagnosis Date  . Hypertension   . Arthritis     psoriatic--Dr.Beekman  . Osteopenia     Dr.John McComb    Family History  Problem Relation Age of Onset  . Kidney cancer Father     prostate   . Cancer Father   . Hypertension Mother     died at age 50   . Autoimmune disease Mother     Hemolytic Anemia  . Breast cancer Maternal Aunt   . Hypertension Maternal Aunt   . Stroke Maternal Grandfather 20  . Diabetes Neg Hx   . Heart disease Neg Hx   . Osteoporosis      Mother, 2 M aunts , MGM  . Atrial fibrillation Daughter 20    Social History   Social History  . Marital Status: Widowed    Spouse Name: N/A  . Number of Children: N/A  . Years of Education: N/A   Occupational History  . Retired    Social History Main Topics  . Smoking status: Never Smoker   . Smokeless tobacco: Never Used  . Alcohol Use: No  . Drug Use: No  . Sexual Activity: Not Asked   Other Topics Concern  . 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   Childbirth x3    Outpatient Prescriptions Prior to Visit  Medication Sig Dispense Refill  . Calcium Carbonate (CALCIUM 600 PO) Take 1 tablet by mouth daily.    . chlorthalidone (HYGROTON) 25 MG tablet TAKE 1/2-1  TABLET BY MOUTH EVERY DAY AS DIRECTED 90 tablet 2  . Cholecalciferol (VITAMIN D) 2000 UNITS CAPS Take 1 capsule by mouth daily.     . folic acid (FOLVITE) 1 MG tablet Take 1 mg by mouth daily.      Marland Kitchen losartan (COZAAR) 100 MG tablet TAKE 1 TABLET BY MOUTH EVERY DAY 90 tablet 1  . methotrexate (RHEUMATREX) 2.5 MG tablet Take 10 mg by mouth once a week. Caution:Chemotherapy. Protect from light. TAKES ON SAT    . metoprolol succinate (TOPROL-XL) 50 MG 24 hr tablet TAKE 1 TABLET BY MOUTH EVERY DAY 90 tablet 2  . Multiple Vitamin (MULTIVITAMIN) tablet Take 1 tablet by mouth daily.    . vitamin C (ASCORBIC ACID) 500 MG tablet Take 500 mg by mouth daily.     No facility-administered medications prior to visit.     EXAM:  BP 164/80 mmHg  Temp(Src) 98.2 F (36.8 C) (Oral)  Ht 5\' 2"  (1.575 m)  Wt 176 lb 3.2 oz (79.924 kg)  BMI 32.22 kg/m2  LMP  04/19/1997  Body mass index is 32.22 kg/(m^2).  GENERAL: vitals reviewed and listed above, alert, oriented, appears well hydrated and in no acute distress HEENT: atraumatic, conjunctiva  clear, no obvious abnormalities on inspection of external nose and ears MS: moves all extremities without noticeable focal  Abnormality Right leg with 2+ cm based pink flat based well demarcated with plaque like  type top  Keratotic looking   PSYCH: pleasant and cooperative, no obvious depression or anxiety  ASSESSMENT AND PLAN:  Discussed the following assessment and plan:  Skin lesion - growing  advise lesion evaluation as discussed  - Plan: Ambulatory referral to Dermatology  Neoplasm of uncertain behavior of skin - Plan: Ambulatory referral to Dermatology  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  -Patient advised to return or notify health care team  if symptoms worsen ,persist or new concerns arise.  Patient Instructions    Will do referral to  look a the skin lesion that is growing    [facility]  [address], [place], [place] [address] [phone] [place]. Grason Brailsford M.D.

## 2015-08-16 NOTE — Patient Instructions (Signed)
   Will do referral to look a the skin lesion that is growing    [id number], [place], [place] [address] [phone]

## 2015-09-20 ENCOUNTER — Other Ambulatory Visit: Payer: Self-pay | Admitting: Internal Medicine

## 2015-09-20 NOTE — Telephone Encounter (Signed)
Sent to the pharmacy by e-scribe. Pt has upcoming appt on 11/20/15

## 2015-10-14 DIAGNOSIS — M5136 Other intervertebral disc degeneration, lumbar region: Secondary | ICD-10-CM | POA: Diagnosis not present

## 2015-10-14 DIAGNOSIS — L4050 Arthropathic psoriasis, unspecified: Secondary | ICD-10-CM | POA: Diagnosis not present

## 2015-10-14 DIAGNOSIS — Z79899 Other long term (current) drug therapy: Secondary | ICD-10-CM | POA: Diagnosis not present

## 2015-11-20 ENCOUNTER — Ambulatory Visit (INDEPENDENT_AMBULATORY_CARE_PROVIDER_SITE_OTHER): Payer: Medicare Other | Admitting: Internal Medicine

## 2015-11-20 ENCOUNTER — Encounter: Payer: Self-pay | Admitting: Internal Medicine

## 2015-11-20 VITALS — BP 156/86 | Temp 98.3°F | Ht 61.5 in | Wt 174.1 lb

## 2015-11-20 DIAGNOSIS — I10 Essential (primary) hypertension: Secondary | ICD-10-CM | POA: Diagnosis not present

## 2015-11-20 DIAGNOSIS — Z23 Encounter for immunization: Secondary | ICD-10-CM | POA: Diagnosis not present

## 2015-11-20 DIAGNOSIS — L4050 Arthropathic psoriasis, unspecified: Secondary | ICD-10-CM | POA: Diagnosis not present

## 2015-11-20 DIAGNOSIS — R7309 Other abnormal glucose: Secondary | ICD-10-CM | POA: Diagnosis not present

## 2015-11-20 DIAGNOSIS — Z0000 Encounter for general adult medical examination without abnormal findings: Secondary | ICD-10-CM | POA: Diagnosis not present

## 2015-11-20 DIAGNOSIS — Z79899 Other long term (current) drug therapy: Secondary | ICD-10-CM

## 2015-11-20 LAB — CBC WITH DIFFERENTIAL/PLATELET
BASOS ABS: 0.1 10*3/uL (ref 0.0–0.1)
BASOS PCT: 1.1 % (ref 0.0–3.0)
EOS ABS: 0.3 10*3/uL (ref 0.0–0.7)
Eosinophils Relative: 3.9 % (ref 0.0–5.0)
HEMATOCRIT: 41.5 % (ref 36.0–46.0)
HEMOGLOBIN: 14.1 g/dL (ref 12.0–15.0)
LYMPHS PCT: 30.3 % (ref 12.0–46.0)
Lymphs Abs: 2.7 10*3/uL (ref 0.7–4.0)
MCHC: 34 g/dL (ref 30.0–36.0)
MCV: 91.1 fl (ref 78.0–100.0)
Monocytes Absolute: 0.7 10*3/uL (ref 0.1–1.0)
Monocytes Relative: 8.4 % (ref 3.0–12.0)
Neutro Abs: 5 10*3/uL (ref 1.4–7.7)
Neutrophils Relative %: 56.3 % (ref 43.0–77.0)
Platelets: 363 10*3/uL (ref 150.0–400.0)
RBC: 4.55 Mil/uL (ref 3.87–5.11)
RDW: 13.4 % (ref 11.5–15.5)
WBC: 8.8 10*3/uL (ref 4.0–10.5)

## 2015-11-20 LAB — BASIC METABOLIC PANEL
BUN: 13 mg/dL (ref 6–23)
CALCIUM: 9.9 mg/dL (ref 8.4–10.5)
CO2: 28 mEq/L (ref 19–32)
CREATININE: 0.61 mg/dL (ref 0.40–1.20)
Chloride: 101 mEq/L (ref 96–112)
GFR: 101.75 mL/min (ref >60.00)
Glucose, Bld: 89 mg/dL (ref 70–99)
Potassium: 3.7 mEq/L (ref 3.5–5.1)
Sodium: 138 mEq/L (ref 135–145)

## 2015-11-20 LAB — HEMOGLOBIN A1C: Hgb A1c MFr Bld: 5.5 % (ref 4.6–6.5)

## 2015-11-20 NOTE — Progress Notes (Signed)
Chief Complaint  Patient presents with  . Medicare Wellness    HPI: Michelle [name] 75 y.o. comes in today for Preventive Medicare wellness visit .Since last visit.  Doing ok on med for PS arthritis    Ok  rhum . mtx  Last labs 1 month ago .  Bp some high  And on  Wrist  Machine  140 range  On meds x 3   Diet  Sugar attention   Health Maintenance  Topic Date Due  . DEXA SCAN  05/01/2006  . COLONOSCOPY  02/03/2015  . MAMMOGRAM  02/08/2015  . ZOSTAVAX  11/18/2016 (Originally 04/30/2001)  . TETANUS/TDAP  02/05/2016  . INFLUENZA VACCINE  Completed  . PNA vac Low Risk Adult  Completed   Health Maintenance Review LIFESTYLE:  [facility]  Sugar beverages: tea   2 -8 ox per day .  Sleep: 8-9 HH of  1 no pets     [place]   Hearing:   Delight:  No limitations at present . Last eye check UTD  Safety:  Has smoke detector and wears seat belts.  No firearms. No excess sun exposure. Sees dentist regularly.  Falls: no   Advance directive :  Reviewed  Has one.  Memory: Felt to be good  , no concern from her or her family.  Depression: No anhedonia unusual crying or depressive symptoms  Nutrition: Eats well balanced diet; adequate calcium and vitamin D. No swallowing chewing problems.  Injury: no major injuries in the last six months.  Other healthcare providers:  Reviewed today .  Social:  Lives  Alone  No pets.   Preventive parameters: up-to-date  Reviewed   ADLS:   There are no problems or need for assistance  driving, feeding, obtaining food, dressing, toileting and bathing, managing money using phone. She is independent.  ROS:  GEN/ HEENT: No fever, significant weight changes sweats headaches vision problems hearing changes, CV/ PULM; No chest pain shortness of breath cough, syncope,edema  change in exercise tolerance. GI /GU: No adominal pain, vomiting, change in bowel habits. No blood in the  stool. No significant GU symptoms. SKIN/HEME: ,no acute skin rashes suspicious lesions or bleeding. No lymphadenopathy, nodules, masses.  NEURO/ PSYCH:  No neurologic signs such as weakness numbness. No depression anxiety. IMM/ Allergy: No unusual infections.  Allergy .   REST of 12 system review negative except as per HPI   Past Medical History:  Diagnosis Date  . 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 Hypertension   . Osteopenia    Dr.John McComb    Family History  Problem Relation Age of Onset  . Kidney cancer Father     prostate   . Cancer Father   . Hypertension Mother     died at age 34   . Autoimmune disease Mother     Hemolytic Anemia  . Breast cancer Maternal Aunt   . Hypertension Maternal Aunt   . Stroke Maternal Grandfather 75  . Osteoporosis      Mother, 2 M aunts , MGM  . Atrial fibrillation Daughter 59  . Diabetes Neg Hx   . Heart disease Neg Hx     Social History   Social History  . Marital status: Widowed    Spouse name: N/A  . Number of children: N/A  . Years of education: N/A   Occupational History  .  Retired    Social History Main Topics  . Smoking status: Never Smoker  . Smokeless tobacco: Never Used  . Alcohol use No  . Drug use: No  . Sexual activity: Not Asked   Other Topics Concern  . 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   Childbirth x3    Outpatient Encounter Prescriptions as of 11/20/2015  Medication Sig  . Calcium Carbonate (CALCIUM 600 PO) Take 1 tablet by mouth daily.  . chlorthalidone (HYGROTON) 25 MG tablet TAKE 1/2-1 TABLET BY MOUTH EVERY DAY AS DIRECTED (Patient taking differently: TAKE 1 TABLET BY MOUTH EVERY DAY AS DIRECTED)  . Cholecalciferol (VITAMIN D) 2000 UNITS CAPS Take 1 capsule by mouth daily.   . folic  acid (FOLVITE) 1 MG tablet Take 1 mg by mouth daily.    Marland Kitchen losartan (COZAAR) 100 MG tablet TAKE 1 TABLET BY MOUTH EVERY DAY  . methotrexate (RHEUMATREX) 2.5 MG tablet Take 12.5 mg by mouth once a week. Caution:Chemotherapy. Protect from light. TAKES ON SAT  . metoprolol succinate (TOPROL-XL) 50 MG 24 hr tablet TAKE 1 TABLET BY MOUTH EVERY DAY  . Multiple Vitamin (MULTIVITAMIN) tablet Take 1 tablet by mouth daily.  . vitamin C (ASCORBIC ACID) 500 MG tablet Take 500 mg by mouth daily.   No facility-administered encounter medications on file as of 11/20/2015.     EXAM:  BP (!) 156/86 (BP Location: Right Arm, Cuff Size: Large)   Temp 98.3 F (36.8 C) (Oral)   Ht 5' 1.5" (1.562 m)   Wt 174 lb 1.6 oz (79 kg)   LMP 04/19/1997   BMI 32.36 kg/m   Body mass index is 32.36 kg/m.  Physical Exam: Vital signs reviewed [id number] is a well-developed well-nourished alert cooperative   who appears stated age in no acute distress.  HEENT: normocephalic atraumatic , Eyes: PERRL EOM's full, conjunctiva clear, Nares: paten,t no deformity discharge or tenderness., Ears: no deformity EAC's clear TMs with normal landmarks. Mouth: clear OP, no lesions, edema.  Moist mucous membranes. Dentition in adequate repair. NECK: supple without masses, thyromegaly or bruits. CHEST/PULM:  Clear to auscultation and percussion breath sounds equal no wheeze , rales or rhonchi. No chest wall deformities or tenderness.Breast: normal by inspection . No dimpling, discharge, masses, tenderness or discharge . CV: PMI is nondisplaced, S1 S2 no gallops, murmurs, rubs. Peripheral pulses are full without delay.No JVD .  ABDOMEN: Bowel sounds normal nontender  No guard or rebound, no hepato splenomegal no CVA tenderness.   Extremtities:  No clubbing cyanosis or edema, no acute joint swelling or redness no focal atrophy NEURO:  Oriented x3, cranial nerves 3-12 appear to be intact, no obvious focal weakness,gait within normal limits    SKIN: No acute rashes normal turgor, color, no bruising or petechiae. PSYCH: Oriented, good eye contact, no obvious depression anxiety, cognition and judgment appear normal. LN: no cervical axillary inguinal adenopathy No noted deficits in memory, attention, and speech.   Lab Results  Component Value Date   WBC 8.8 11/20/2015   HGB 14.1 11/20/2015   HCT 41.5 11/20/2015   PLT 363.0 11/20/2015   GLUCOSE 89 11/20/2015   CHOL 157 11/13/2014  TRIG 146.0 11/13/2014   HDL 50.30 11/13/2014   LDLCALC 78 11/13/2014   ALT 17 11/13/2014   AST 22 11/13/2014   NA 138 11/20/2015   K 3.7 11/20/2015   CL 101 11/20/2015   CREATININE 0.61 11/20/2015   BUN 13 11/20/2015   CO2 28 11/20/2015   TSH 1.12 11/13/2014   HGBA1C 5.5 11/20/2015    ASSESSMENT AND PLAN:  Discussed the following assessment and plan:  Medicare annual wellness visit, subsequent  Essential hypertension - bordereline at home  139 / higher in office get more info and rov with monitor may need med adjustment - Plan: Basic metabolic panel, CBC with Differential/Platelet, Hemoglobin A1c  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[place]) - Plan: Basic metabolic panel, CBC with Differential/Platelet, Hemoglobin A1c  Medication management - Plan: Basic metabolic panel, CBC with Differential/Platelet, Hemoglobin A1c  FASTING HYPERGLYCEMIA - Plan: Basic metabolic panel, Hemoglobin A1c  Need for prophylactic vaccination and inoculation against influenza - Plan: Flu vaccine HIGH DOSE PF (Fluzone High dose)  chornic venous insufficicney  Stable  Cbc diff  Bmp Dexa mammo  Done 2016  Per Dr Radene Knee    ?COLON on OCt 29th  Patient Care Team: Burnis Medin, MD as PCP - General (Internal Medicine) Hennie Duos, MD as Consulting Physician (Rheumatology) Arvella Nigh, MD as Consulting Physician (Obstetrics and Gynecology) Ralene Bathe, MD as Consulting Physician (Ophthalmology)  Patient Instructions  Will notify you  of labs when available. Limit  sweet beverages.  Continue lifestyle intervention healthy eating and exercise .  bp goal  Below 140/90 and if not in range contact us for advice.   Keep utd for colon and breast cancer screening    Get info copies to [place].    DASH Eating Plan DASH stands for "Dietary Approaches to Stop Hypertension." The DASH eating plan is a healthy eating plan that has been shown to reduce high blood pressure (hypertension). Additional health benefits may include reducing the risk of type 2 diabetes mellitus, heart disease, and stroke. The DASH eating plan may also help with weight loss. WHAT DO I NEED TO KNOW ABOUT THE DASH EATING PLAN? For the DASH eating plan, you will follow these general guidelines:  Choose foods with a percent daily value for sodium of less than 5% (as listed on the food label).  Use salt-free seasonings or herbs instead of table salt or sea salt.  Check with your health care provider or pharmacist before using salt substitutes.  Eat lower-sodium products, often labeled as "lower sodium" or "no salt added."  Eat fresh foods.  Eat more vegetables, fruits, and low-fat dairy products.  Choose whole grains. Look for the word "whole" as the first word in the ingredient list.  Choose fish and skinless chicken or [place] more often than red meat. Limit fish, poultry, and meat to 6 oz (170 g) each day.  Limit sweets, desserts, sugars, and sugary drinks.  Choose heart-healthy fats.  Limit cheese to 1 oz (28 g) per day.  Eat more home-cooked food and less restaurant, buffet, and fast food.  Limit fried foods.  Cook foods using methods other than frying.  Limit canned vegetables. If you do use them, rinse them well to decrease the sodium.  When eating at a restaurant, ask that your food be prepared with less salt, or no salt if possible. WHAT FOODS CAN I EAT? Seek help from a dietitian for individual calorie needs. Grains Whole grain or whole wheat bread. Brown rice. Whole  grain or  whole wheat pasta. Quinoa, bulgur, and whole grain cereals. Low-sodium cereals. Corn or whole wheat flour tortillas. Whole grain cornbread. Whole grain crackers. Low-sodium crackers. Vegetables Fresh or frozen vegetables (raw, steamed, roasted, or grilled). Low-sodium or reduced-sodium tomato and vegetable juices. Low-sodium or reduced-sodium tomato sauce and paste. Low-sodium or reduced-sodium canned vegetables.  Fruits All fresh, canned (in natural juice), or frozen fruits. Meat and Other Protein Products Ground beef (85% or leaner), grass-fed beef, or beef trimmed of fat. Skinless chicken or [place]. Ground chicken or [place]. Pork trimmed of fat. All fish and seafood. Eggs. Dried beans, peas, or lentils. Unsalted nuts and seeds. Unsalted canned beans. Dairy Low-fat dairy products, such as skim or 1% milk, 2% or reduced-fat cheeses, low-fat ricotta or cottage cheese, or plain low-fat yogurt. Low-sodium or reduced-sodium cheeses. Fats and Oils Tub margarines without trans fats. Light or reduced-fat mayonnaise and salad dressings (reduced sodium). Avocado. Safflower, olive, or canola oils. Natural peanut or almond butter. Other Unsalted popcorn and pretzels. The items listed above may not be a complete list of recommended foods or beverages. Contact your dietitian for more options. WHAT FOODS ARE NOT RECOMMENDED? Grains White bread. White pasta. White rice. Refined cornbread. Bagels and croissants. Crackers that contain trans fat. Vegetables Creamed or fried vegetables. Vegetables in a cheese sauce. Regular canned vegetables. Regular canned tomato sauce and paste. Regular tomato and vegetable juices. Fruits Dried fruits. Canned fruit in light or heavy syrup. Fruit juice. Meat and Other Protein Products Fatty cuts of meat. Ribs, chicken wings, bacon, sausage, bologna, salami, chitterlings, fatback, hot dogs, bratwurst, and packaged luncheon meats. Salted nuts and seeds. Canned beans with  salt. Dairy Whole or 2% milk, cream, half-and-half, and cream cheese. Whole-fat or sweetened yogurt. Full-fat cheeses or blue cheese. Nondairy creamers and whipped toppings. Processed cheese, cheese spreads, or cheese curds. Condiments Onion and garlic salt, seasoned salt, table salt, and sea salt. Canned and packaged gravies. Worcestershire sauce. Tartar sauce. Barbecue sauce. Teriyaki sauce. Soy sauce, including reduced sodium. Steak sauce. Fish sauce. Oyster sauce. Cocktail sauce. Horseradish. Ketchup and mustard. Meat flavorings and tenderizers. Bouillon cubes. Hot sauce. Tabasco sauce. Marinades. Taco seasonings. Relishes. Fats and Oils Butter, stick margarine, lard, shortening, ghee, and bacon fat. Coconut, palm kernel, or palm oils. Regular salad dressings. Other Pickles and olives. Salted popcorn and pretzels. The items listed above may not be a complete list of foods and beverages to avoid. Contact your dietitian for more information. WHERE CAN I FIND MORE INFORMATION? National Heart, Lung, and Blood Institute: travelstabloid.com   This information is not intended to replace advice given to you by your health care provider. Make sure you discuss any questions you have with your health care provider.   Document Released: 01/08/2011 Document Revised: 02/09/2014 Document Reviewed: 11/23/2012 Elsevier Interactive Patient Education 2016 [place] K. Panosh M.D.

## 2015-11-20 NOTE — Patient Instructions (Signed)
Will notify you  of labs when available. Limit sweet beverages.  Continue lifestyle intervention healthy eating and exercise .  bp goal  Below 140/90 and if not in range contact us for advice.   Keep utd for colon and breast cancer screening    Get info copies to [place].    DASH Eating Plan DASH stands for "Dietary Approaches to Stop Hypertension." The DASH eating plan is a healthy eating plan that has been shown to reduce high blood pressure (hypertension). Additional health benefits may include reducing the risk of type 2 diabetes mellitus, heart disease, and stroke. The DASH eating plan may also help with weight loss. WHAT DO I NEED TO KNOW ABOUT THE DASH EATING PLAN? For the DASH eating plan, you will follow these general guidelines:  Choose foods with a percent daily value for sodium of less than 5% (as listed on the food label).  Use salt-free seasonings or herbs instead of table salt or sea salt.  Check with your health care provider or pharmacist before using salt substitutes.  Eat lower-sodium products, often labeled as "lower sodium" or "no salt added."  Eat fresh foods.  Eat more vegetables, fruits, and low-fat dairy products.  Choose whole grains. Look for the word "whole" as the first word in the ingredient list.  Choose fish and skinless chicken or [place] more often than red meat. Limit fish, poultry, and meat to 6 oz (170 g) each day.  Limit sweets, desserts, sugars, and sugary drinks.  Choose heart-healthy fats.  Limit cheese to 1 oz (28 g) per day.  Eat more home-cooked food and less restaurant, buffet, and fast food.  Limit fried foods.  Cook foods using methods other than frying.  Limit canned vegetables. If you do use them, rinse them well to decrease the sodium.  When eating at a restaurant, ask that your food be prepared with less salt, or no salt if possible. WHAT FOODS CAN I EAT? Seek help from a dietitian for individual calorie needs. Grains Whole  grain or whole wheat bread. Brown rice. Whole grain or whole wheat pasta. Quinoa, bulgur, and whole grain cereals. Low-sodium cereals. Corn or whole wheat flour tortillas. Whole grain cornbread. Whole grain crackers. Low-sodium crackers. Vegetables Fresh or frozen vegetables (raw, steamed, roasted, or grilled). Low-sodium or reduced-sodium tomato and vegetable juices. Low-sodium or reduced-sodium tomato sauce and paste. Low-sodium or reduced-sodium canned vegetables.  Fruits All fresh, canned (in natural juice), or frozen fruits. Meat and Other Protein Products Ground beef (85% or leaner), grass-fed beef, or beef trimmed of fat. Skinless chicken or [place]. Ground chicken or [place]. Pork trimmed of fat. All fish and seafood. Eggs. Dried beans, peas, or lentils. Unsalted nuts and seeds. Unsalted canned beans. Dairy Low-fat dairy products, such as skim or 1% milk, 2% or reduced-fat cheeses, low-fat ricotta or cottage cheese, or plain low-fat yogurt. Low-sodium or reduced-sodium cheeses. Fats and Oils Tub margarines without trans fats. Light or reduced-fat mayonnaise and salad dressings (reduced sodium). Avocado. Safflower, olive, or canola oils. Natural peanut or almond butter. Other Unsalted popcorn and pretzels. The items listed above may not be a complete list of recommended foods or beverages. Contact your dietitian for more options. WHAT FOODS ARE NOT RECOMMENDED? Grains White bread. White pasta. White rice. Refined cornbread. Bagels and croissants. Crackers that contain trans fat. Vegetables Creamed or fried vegetables. Vegetables in a cheese sauce. Regular canned vegetables. Regular canned tomato sauce and paste. Regular tomato and vegetable juices. Fruits Dried fruits. Canned  fruit in light or heavy syrup. Fruit juice. Meat and Other Protein Products Fatty cuts of meat. Ribs, chicken wings, bacon, sausage, bologna, salami, chitterlings, fatback, hot dogs, bratwurst, and packaged luncheon  meats. Salted nuts and seeds. Canned beans with salt. Dairy Whole or 2% milk, cream, half-and-half, and cream cheese. Whole-fat or sweetened yogurt. Full-fat cheeses or blue cheese. Nondairy creamers and whipped toppings. Processed cheese, cheese spreads, or cheese curds. Condiments Onion and garlic salt, seasoned salt, table salt, and sea salt. Canned and packaged gravies. Worcestershire sauce. Tartar sauce. Barbecue sauce. Teriyaki sauce. Soy sauce, including reduced sodium. Steak sauce. Fish sauce. Oyster sauce. Cocktail sauce. Horseradish. Ketchup and mustard. Meat flavorings and tenderizers. Bouillon cubes. Hot sauce. Tabasco sauce. Marinades. Taco seasonings. Relishes. Fats and Oils Butter, stick margarine, lard, shortening, ghee, and bacon fat. Coconut, palm kernel, or palm oils. Regular salad dressings. Other Pickles and olives. Salted popcorn and pretzels. The items listed above may not be a complete list of foods and beverages to avoid. Contact your dietitian for more information. WHERE CAN I FIND MORE INFORMATION? National Heart, Lung, and Blood Institute: travelstabloid.com   This information is not intended to replace advice given to you by your health care provider. Make sure you discuss any questions you have with your health care provider.   Document Released: 01/08/2011 Document Revised: 02/09/2014 Document Reviewed: 11/23/2012 Elsevier Interactive Patient Education [employer].

## 2015-12-02 DIAGNOSIS — Z1211 Encounter for screening for malignant neoplasm of colon: Secondary | ICD-10-CM | POA: Diagnosis not present

## 2015-12-02 DIAGNOSIS — K640 First degree hemorrhoids: Secondary | ICD-10-CM | POA: Diagnosis not present

## 2015-12-02 LAB — HM COLONOSCOPY

## 2015-12-09 ENCOUNTER — Encounter: Payer: Self-pay | Admitting: Family Medicine

## 2015-12-29 ENCOUNTER — Other Ambulatory Visit: Payer: Self-pay | Admitting: Internal Medicine

## 2015-12-31 NOTE — Telephone Encounter (Signed)
Sent to the pharmacy by e-scribe for 1 year.  Pt has upcoming cpx on 11/24/16.

## 2016-01-11 ENCOUNTER — Other Ambulatory Visit: Payer: Self-pay | Admitting: Internal Medicine

## 2016-01-14 NOTE — Telephone Encounter (Signed)
Sent to the pharmacy by e-scribe for 1 year.  Pt has upcoming wellness on 11/24/16.  Seen 11/20/15 and asked to return in 1 year.

## 2016-02-03 NOTE — Progress Notes (Deleted)
No chief complaint on file.   HPI: Michelle [name] 76 y.o.  Seen 10 17 for AWV and bp was elevated   Was to bring in home monitor and readings  ROS: See pertinent positives and negatives per HPI.  Past Medical History:  Diagnosis Date  . 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 Hypertension   . Osteopenia    Dr.John McComb    Family History  Problem Relation Age of Onset  . Kidney cancer Father     prostate   . Cancer Father   . Hypertension Mother     died at age 36   . Autoimmune disease Mother     Hemolytic Anemia  . Breast cancer Maternal Aunt   . Hypertension Maternal Aunt   . Stroke Maternal Grandfather 41  . Osteoporosis      Mother, 2 M aunts , MGM  . Atrial fibrillation Daughter 58  . Diabetes Neg Hx   . Heart disease Neg Hx     Social History   Social History  . Marital status: Widowed    Spouse name: N/A  . Number of children: N/A  . Years of education: N/A   Occupational History  . Retired    Social History Main Topics  . Smoking status: Never Smoker  . Smokeless tobacco: Never Used  . Alcohol use No  . Drug use: No  . Sexual activity: Not on file   Other Topics Concern  . Not on file   Social History Narrative   9 hours of sleep per night   Single living in the home (widow)   No pets   Retired   Children 3  . 2 local 1 in [place] and  then [place] back to [employer] with exercise.   Childbirth x3    Outpatient Medications Prior to Visit  Medication Sig Dispense Refill  . Calcium Carbonate (CALCIUM 600 PO) Take 1 tablet by mouth daily.    . chlorthalidone (HYGROTON) 25 MG tablet TAKE 1/2-1 TABLET BY MOUTH EVERY DAY AS DIRECTED 90 tablet 3  . Cholecalciferol (VITAMIN D) 2000 UNITS CAPS Take 1 capsule by mouth daily.     . folic acid (FOLVITE) 1 MG tablet Take 1 mg by mouth daily.      Marland Kitchen losartan (COZAAR) 100 MG tablet TAKE 1 TABLET BY MOUTH EVERY DAY 90 tablet 3  . methotrexate (RHEUMATREX) 2.5 MG  tablet Take 12.5 mg by mouth once a week. Caution:Chemotherapy. Protect from light. TAKES ON SAT    . metoprolol succinate (TOPROL-XL) 50 MG 24 hr tablet TAKE 1 TABLET BY MOUTH EVERY DAY 90 tablet 3  . Multiple Vitamin (MULTIVITAMIN) tablet Take 1 tablet by mouth daily.    . vitamin C (ASCORBIC ACID) 500 MG tablet Take 500 mg by mouth daily.     No facility-administered medications prior to visit.      EXAM:  LMP 04/19/1997   There is no height or weight on file to calculate BMI.  GENERAL: vitals reviewed and listed above, alert, oriented, appears well hydrated and in no acute distress HEENT: atraumatic, conjunctiva  clear, no obvious abnormalities on inspection of external nose and ears OP : no lesion edema or exudate  NECK: no obvious masses on inspection palpation  LUNGS: clear to auscultation bilaterally, no wheezes, rales or rhonchi, good air movement CV: HRRR, no clubbing cyanosis or  peripheral edema nl cap  refill  MS: moves all extremities without noticeable focal  abnormality PSYCH: pleasant and cooperative, no obvious depression or anxiety Lab Results  Component Value Date   WBC 8.8 11/20/2015   HGB 14.1 11/20/2015   HCT 41.5 11/20/2015   PLT 363.0 11/20/2015   GLUCOSE 89 11/20/2015   CHOL 157 11/13/2014   TRIG 146.0 11/13/2014   HDL 50.30 11/13/2014   LDLCALC 78 11/13/2014   ALT 17 11/13/2014   AST 22 11/13/2014   NA 138 11/20/2015   K 3.7 11/20/2015   CL 101 11/20/2015   CREATININE 0.61 11/20/2015   BUN 13 11/20/2015   CO2 28 11/20/2015   TSH 1.12 11/13/2014   HGBA1C 5.5 11/20/2015   BP Readings from Last 3 Encounters:  11/20/15 (!) 156/86  08/16/15 (!) 164/80  11/13/14 130/80   Wt Readings from Last 3 Encounters:  11/20/15 174 lb 1.6 oz (79 kg)  08/16/15 176 lb 3.2 oz (79.9 kg)  11/13/14 176 lb (79.8 kg)    ASSESSMENT AND PLAN:  Discussed the following assessment and plan:  No diagnosis found.  -Patient advised to return or notify health care  team  if symptoms worsen ,persist or new concerns arise.  There are no Patient Instructions on file for this visit.   Standley Brooking. Alivya Wegman M.D.

## 2016-02-04 ENCOUNTER — Ambulatory Visit: Payer: Medicare Other | Admitting: Internal Medicine

## 2016-02-12 DIAGNOSIS — L4050 Arthropathic psoriasis, unspecified: Secondary | ICD-10-CM | POA: Diagnosis not present

## 2016-02-12 DIAGNOSIS — M5136 Other intervertebral disc degeneration, lumbar region: Secondary | ICD-10-CM | POA: Diagnosis not present

## 2016-02-12 DIAGNOSIS — Z79899 Other long term (current) drug therapy: Secondary | ICD-10-CM | POA: Diagnosis not present

## 2016-03-03 DIAGNOSIS — Z803 Family history of malignant neoplasm of breast: Secondary | ICD-10-CM | POA: Diagnosis not present

## 2016-03-03 DIAGNOSIS — Z1231 Encounter for screening mammogram for malignant neoplasm of breast: Secondary | ICD-10-CM | POA: Diagnosis not present

## 2016-03-27 ENCOUNTER — Encounter: Payer: Self-pay | Admitting: Internal Medicine

## 2016-03-27 ENCOUNTER — Ambulatory Visit (INDEPENDENT_AMBULATORY_CARE_PROVIDER_SITE_OTHER): Payer: Medicare Other | Admitting: Internal Medicine

## 2016-03-27 VITALS — BP 164/92 | HR 63 | Temp 98.3°F | Ht 60.0 in | Wt 179.3 lb

## 2016-03-27 DIAGNOSIS — M545 Low back pain, unspecified: Secondary | ICD-10-CM

## 2016-03-27 DIAGNOSIS — Z9181 History of falling: Secondary | ICD-10-CM | POA: Diagnosis not present

## 2016-03-27 DIAGNOSIS — I10 Essential (primary) hypertension: Secondary | ICD-10-CM

## 2016-03-27 DIAGNOSIS — S8011XA Contusion of right lower leg, initial encounter: Secondary | ICD-10-CM | POA: Diagnosis not present

## 2016-03-27 DIAGNOSIS — Z9889 Other specified postprocedural states: Secondary | ICD-10-CM | POA: Diagnosis not present

## 2016-03-27 DIAGNOSIS — L989 Disorder of the skin and subcutaneous tissue, unspecified: Secondary | ICD-10-CM | POA: Diagnosis not present

## 2016-03-27 NOTE — Progress Notes (Signed)
Chief Complaint  Patient presents with  . Fall    right side pain    HPI: Michelle [name] 76 y.o.  sda  2 days ago  Disembarking from airplane and trying to maneuvering  in aisle  Change and luggage or purse caught in arm of chair and she fell into aisle and right side fell into arm  Of chair  No syncope loc dizziness  Was helped up byt passengers  .  Able to walk sore right lateral leg and then right lowr back  Initially radiated to side of knee  Right and now local   . No weakness    Hx of back surgery .  Used cold x 1 and oral nsaid   No gi gu sx  Just unsettled about incident .  bp not checking at home recently  Was supposed to fu sometimes up at other offices  hasn't  Fu yet about this  Asks about  Lesion r hand growing  Thought a wart  ROS: See pertinent positives and negatives per HPI.  Past Medical History:  Diagnosis Date  . 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 Hypertension   . Osteopenia    Dr.John McComb    Family History  Problem Relation Age of Onset  . Kidney cancer Father     prostate   . Cancer Father   . Hypertension Mother     died at age 52   . Autoimmune disease Mother     Hemolytic Anemia  . Breast cancer Maternal Aunt   . Hypertension Maternal Aunt   . Stroke Maternal Grandfather 94  . Osteoporosis      Mother, 2 M aunts , MGM  . Atrial fibrillation Daughter 5  . Diabetes Neg Hx   . Heart disease Neg Hx     Social History   Social History  . Marital status: Widowed    Spouse name: N/A  . Number of children: N/A  . Years of education: N/A   Occupational History  . Retired    Social History Main Topics  . Smoking status: Never Smoker  . Smokeless tobacco: Never Used  . Alcohol use No  . Drug use: No  . Sexual activity: Not Asked   Other Topics Concern  . 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   Childbirth x3    Outpatient Medications Prior to Visit  Medication Sig Dispense Refill  . Calcium Carbonate (CALCIUM 600 PO) Take 1 tablet by mouth daily.    . chlorthalidone (HYGROTON) 25 MG tablet TAKE 1/2-1 TABLET BY MOUTH EVERY DAY AS DIRECTED 90 tablet 3  . Cholecalciferol (VITAMIN D) 2000 UNITS CAPS Take 1 capsule by mouth daily.     . folic acid (FOLVITE) 1 MG tablet Take 1 mg by mouth daily.      Marland Kitchen losartan (COZAAR) 100 MG tablet TAKE 1 TABLET BY MOUTH EVERY DAY 90 tablet 3  . methotrexate (RHEUMATREX) 2.5 MG tablet Take 12.5 mg by mouth once a week. Caution:Chemotherapy. Protect from light. TAKES ON SAT    . metoprolol succinate (TOPROL-XL) 50 MG 24 hr tablet TAKE 1 TABLET BY MOUTH EVERY  DAY 90 tablet 3  . Multiple Vitamin (MULTIVITAMIN) tablet Take 1 tablet by mouth daily.    . vitamin C (ASCORBIC ACID) 500 MG tablet Take 500 mg by mouth daily.     No facility-administered medications prior to visit.      EXAM:  BP (!) 164/92 (BP Location: Right Arm, Patient Position: Sitting, Cuff Size: Normal)   Pulse 63   Temp 98.3 F (36.8 C) (Oral)   Ht 5' (1.524 m)   Wt 179 lb 4.8 oz (81.3 kg)   LMP 04/19/1997   BMI 35.02 kg/m   Body mass index is 35.02 kg/m.  GENERAL: vitals reviewed and listed above, alert, oriented, appears well hydrated and in no acute distress HEENT: atraumatic, conjunctiva  clear, no obvious abnormalities on inspection of external nose and ears  NECK: no obvious masses on inspection palpation  well healed back scar  No mid line tenderness  Right lateral thigh with 6 cm hematoma contusion    Min tenderness  Right supra iliac area no bruise and no bony tenderness  No pelvic bone tenderness and no groin pain wwalks with minimal limp  No weakness .  Right hand  3-4  mm scale white raised area  MS: moves all extremities without noticeable focal  abnormality PSYCH: pleasant and  cooperative, no obvious depression mildly anxious   ASSESSMENT AND PLAN:  Discussed the following assessment and plan:  Contusion of right lower extremity, initial encounter  Acute right-sided low back pain without sciatica  Essential hypertension  Hx of fall  History of back surgery  Skin lesion - verroucouse growing on hand Seems like  Soft tissue injuries    But at risk    Local care ice  Not heat   And sx relief  Stretch ok  Contact next week disc x ray   Imaging etc but able to stand without pain   More likely to be st injury .  Disc fall prevention in airplane situation.   Disc ht a important to control see last notes  And advise fu for rx  -Patient advised to return or notify health care team  if symptoms worsen ,persist or new concerns arise. Total visit 54mins > 50% spent counseling and coordinating care as indicated in above note and in instructions to patient .     Patient Instructions   This appears to be a soft tissue injury. Used cold therapy 5-10 minutes at least 3 times a day on the areas involved. Take care not burning her skin. Activity as tolerated. Contact us by my chart next week about how you were doing. We could consider getting an x-ray but it doesn't sound like you have a fracture. Your blood pressure is up today Please check it to make sure it's coming down to a reasonable range from home readings. Lesion on right hand could be a wart not sure can see  [facility]  [address] [place], [place] [address] [phone]  BP Readings from Last 3 Encounters:  03/27/16 (!) 164/92  11/20/15 (!) 156/86  08/16/15 (!) 164/80    Contusion A contusion is a deep bruise. Contusions are the result of a blunt injury to tissues and muscle fibers under the skin. The injury causes bleeding under the skin. The skin overlying the contusion may turn blue, purple, or yellow. Minor injuries will give you a painless contusion, but more  severe contusions may stay painful and swollen for a few weeks. What  are the causes? This condition is usually caused by a blow, trauma, or direct force to an area of the body. What are the signs or symptoms? Symptoms of this condition include:  Swelling of the injured area.  Pain and tenderness in the injured area.  Discoloration. The area may have redness and then turn blue, purple, or yellow. How is this diagnosed? This condition is diagnosed based on a physical exam and medical history. An X-ray, CT scan, or MRI may be needed to determine if there are any associated injuries, such as broken bones (fractures). How is this treated? Specific treatment for this condition depends on what area of the body was injured. In general, the best treatment for a contusion is resting, icing, applying pressure to (compression), and elevating the injured area. This is often called the RICE strategy. Over-the-counter anti-inflammatory medicines may also be recommended for pain control. Follow these instructions at home:  Rest the injured area.  If directed, apply ice to the injured area:  Put ice in a plastic bag.  Place a towel between your skin and the bag.  Leave the ice on for 20 minutes, 2-3 times per day.  If directed, apply light compression to the injured area using an elastic bandage. Make sure the bandage is not wrapped too tightly. Remove and reapply the bandage as directed by your health care provider.  If possible, raise (elevate) the injured area above the level of your heart while you are sitting or lying down.  Take over-the-counter and prescription medicines only as told by your health care provider. Contact a health care provider if:  Your symptoms do not improve after several days of treatment.  Your symptoms get worse.  You have difficulty moving the injured area. Get help right away if:  You have severe pain.  You have numbness in a hand or foot.  Your hand or foot  turns pale or cold. This information is not intended to replace advice given to you by your health care provider. Make sure you discuss any questions you have with your health care provider. Document Released: 10/29/2004 Document Revised: 05/30/2015 Document Reviewed: 06/06/2014 Elsevier Interactive Patient Education  2017 [place]. Panosh M.D.

## 2016-03-27 NOTE — Patient Instructions (Addendum)
This appears to be a soft tissue injury. Used cold therapy 5-10 minutes at least 3 times a day on the areas involved. Take care not burning her skin. Activity as tolerated. Contact us by my chart next week about how you were doing. We could consider getting an x-ray but it doesn't sound like you have a fracture. Your blood pressure is up today Please check it to make sure it's coming down to a reasonable range from home readings. Lesion on right hand could be a wart not sure can see  [facility]  [address] [place], [place] [address] [phone]  BP Readings from Last 3 Encounters:  03/27/16 (!) 164/92  11/20/15 (!) 156/86  08/16/15 (!) 164/80    Contusion A contusion is a deep bruise. Contusions are the result of a blunt injury to tissues and muscle fibers under the skin. The injury causes bleeding under the skin. The skin overlying the contusion may turn blue, purple, or yellow. Minor injuries will give you a painless contusion, but more severe contusions may stay painful and swollen for a few weeks. What are the causes? This condition is usually caused by a blow, trauma, or direct force to an area of the body. What are the signs or symptoms? Symptoms of this condition include:  Swelling of the injured area.  Pain and tenderness in the injured area.  Discoloration. The area may have redness and then turn blue, purple, or yellow. How is this diagnosed? This condition is diagnosed based on a physical exam and medical history. An X-ray, CT scan, or MRI may be needed to determine if there are any associated injuries, such as broken bones (fractures). How is this treated? Specific treatment for this condition depends on what area of the body was injured. In general, the best treatment for a contusion is resting, icing, applying pressure to (compression), and elevating the injured area. This is often called the RICE strategy. Over-the-counter  anti-inflammatory medicines may also be recommended for pain control. Follow these instructions at home:  Rest the injured area.  If directed, apply ice to the injured area:  Put ice in a plastic bag.  Place a towel between your skin and the bag.  Leave the ice on for 20 minutes, 2-3 times per day.  If directed, apply light compression to the injured area using an elastic bandage. Make sure the bandage is not wrapped too tightly. Remove and reapply the bandage as directed by your health care provider.  If possible, raise (elevate) the injured area above the level of your heart while you are sitting or lying down.  Take over-the-counter and prescription medicines only as told by your health care provider. Contact a health care provider if:  Your symptoms do not improve after several days of treatment.  Your symptoms get worse.  You have difficulty moving the injured area. Get help right away if:  You have severe pain.  You have numbness in a hand or foot.  Your hand or foot turns pale or cold. This information is not intended to replace advice given to you by your health care provider. Make sure you discuss any questions you have with your health care provider. Document Released: 10/29/2004 Document Revised: 05/30/2015 Document Reviewed: 06/06/2014 Elsevier Interactive Patient Education  2017 [employer].

## 2016-05-19 DIAGNOSIS — Z01419 Encounter for gynecological examination (general) (routine) without abnormal findings: Secondary | ICD-10-CM | POA: Diagnosis not present

## 2016-05-19 DIAGNOSIS — Z6832 Body mass index (BMI) 32.0-32.9, adult: Secondary | ICD-10-CM | POA: Diagnosis not present

## 2016-05-19 DIAGNOSIS — Z780 Asymptomatic menopausal state: Secondary | ICD-10-CM | POA: Diagnosis not present

## 2016-05-19 DIAGNOSIS — N3281 Overactive bladder: Secondary | ICD-10-CM | POA: Diagnosis not present

## 2016-05-19 DIAGNOSIS — E041 Nontoxic single thyroid nodule: Secondary | ICD-10-CM | POA: Diagnosis not present

## 2016-05-20 ENCOUNTER — Other Ambulatory Visit (HOSPITAL_COMMUNITY): Payer: Self-pay | Admitting: Obstetrics and Gynecology

## 2016-05-20 DIAGNOSIS — R0989 Other specified symptoms and signs involving the circulatory and respiratory systems: Secondary | ICD-10-CM

## 2016-05-20 DIAGNOSIS — E042 Nontoxic multinodular goiter: Secondary | ICD-10-CM

## 2016-05-25 ENCOUNTER — Ambulatory Visit (HOSPITAL_COMMUNITY)
Admission: RE | Admit: 2016-05-25 | Discharge: 2016-05-25 | Disposition: A | Discharge status: Home / Self Care | Payer: Medicare Other | Source: Ambulatory Visit | Attending: Obstetrics and Gynecology | Admitting: Obstetrics and Gynecology

## 2016-05-25 DIAGNOSIS — E042 Nontoxic multinodular goiter: Secondary | ICD-10-CM

## 2016-05-25 DIAGNOSIS — R0989 Other specified symptoms and signs involving the circulatory and respiratory systems: Secondary | ICD-10-CM | POA: Diagnosis not present

## 2016-05-25 DIAGNOSIS — E041 Nontoxic single thyroid nodule: Secondary | ICD-10-CM | POA: Diagnosis present

## 2016-05-25 LAB — VAS US CAROTID
LCCADDIAS: 14 cm/s
LCCAPDIAS: -17 cm/s
LEFT ECA DIAS: -7 cm/s
LEFT VERTEBRAL DIAS: 10 cm/s
LICADSYS: -65 cm/s
LICAPSYS: -53 cm/s
Left CCA dist sys: 69 cm/s
Left CCA prox sys: -72 cm/s
Left ICA dist dias: -23 cm/s
Left ICA prox dias: -21 cm/s
RIGHT ECA DIAS: -5 cm/s
RIGHT VERTEBRAL DIAS: 11 cm/s
Right CCA prox dias: 14 cm/s
Right CCA prox sys: 71 cm/s
Right cca dist sys: -56 cm/s

## 2016-05-25 NOTE — Progress Notes (Signed)
*  PRELIMINARY RESULTS* Vascular Ultrasound Carotid Duplex (Doppler) has been completed.  Preliminary findings: Bilateral: No significant ICA stenosis. Antegrade vertebral flow.     Landry Mellow, RDMS, RVT  05/25/2016, 10:58 AM

## 2016-06-04 ENCOUNTER — Other Ambulatory Visit: Payer: Self-pay | Admitting: Neurosurgery

## 2016-06-04 DIAGNOSIS — E041 Nontoxic single thyroid nodule: Secondary | ICD-10-CM

## 2016-06-08 DIAGNOSIS — Z961 Presence of intraocular lens: Secondary | ICD-10-CM | POA: Diagnosis not present

## 2016-06-08 DIAGNOSIS — H524 Presbyopia: Secondary | ICD-10-CM | POA: Diagnosis not present

## 2016-06-10 DIAGNOSIS — Z79899 Other long term (current) drug therapy: Secondary | ICD-10-CM | POA: Diagnosis not present

## 2016-06-10 DIAGNOSIS — L4050 Arthropathic psoriasis, unspecified: Secondary | ICD-10-CM | POA: Diagnosis not present

## 2016-06-10 DIAGNOSIS — M5136 Other intervertebral disc degeneration, lumbar region: Secondary | ICD-10-CM | POA: Diagnosis not present

## 2016-06-18 ENCOUNTER — Ambulatory Visit
Admission: RE | Admit: 2016-06-18 | Discharge: 2016-06-18 | Disposition: A | Discharge status: Home / Self Care | Payer: Medicare Other | Source: Ambulatory Visit | Attending: Neurosurgery | Admitting: Neurosurgery

## 2016-06-18 ENCOUNTER — Other Ambulatory Visit (HOSPITAL_COMMUNITY)
Admission: RE | Admit: 2016-06-18 | Discharge: 2016-06-18 | Disposition: A | Discharge status: Home / Self Care | Payer: Medicare Other | Source: Ambulatory Visit | Attending: Physician Assistant | Admitting: Physician Assistant

## 2016-06-18 DIAGNOSIS — E041 Nontoxic single thyroid nodule: Secondary | ICD-10-CM | POA: Insufficient documentation

## 2016-06-18 NOTE — Procedures (Signed)
PROCEDURE SUMMARY:  Using direct ultrasound guidance, 4 passes were made using 25 g needles into the nodule within the left lobe of the thyroid.   Ultrasound was used to confirm needle placements on all occasions.   Specimens were sent to Pathology for analysis.  Michelle [name] S Huong Luthi PA-C 06/18/2016 11:06 AM

## 2016-06-22 DIAGNOSIS — M545 Low back pain: Secondary | ICD-10-CM | POA: Diagnosis not present

## 2016-06-22 DIAGNOSIS — M7061 Trochanteric bursitis, right hip: Secondary | ICD-10-CM | POA: Diagnosis not present

## 2016-07-23 DIAGNOSIS — E041 Nontoxic single thyroid nodule: Secondary | ICD-10-CM | POA: Diagnosis not present

## 2016-09-29 DIAGNOSIS — L4050 Arthropathic psoriasis, unspecified: Secondary | ICD-10-CM | POA: Diagnosis not present

## 2016-09-29 DIAGNOSIS — Z79899 Other long term (current) drug therapy: Secondary | ICD-10-CM | POA: Diagnosis not present

## 2016-09-29 DIAGNOSIS — M5136 Other intervertebral disc degeneration, lumbar region: Secondary | ICD-10-CM | POA: Diagnosis not present

## 2016-10-23 ENCOUNTER — Encounter: Payer: Self-pay | Admitting: Internal Medicine

## 2016-11-24 ENCOUNTER — Encounter: Payer: Medicare Other | Admitting: Internal Medicine

## 2016-12-01 ENCOUNTER — Ambulatory Visit (INDEPENDENT_AMBULATORY_CARE_PROVIDER_SITE_OTHER): Payer: Medicare Other

## 2016-12-01 DIAGNOSIS — Z23 Encounter for immunization: Secondary | ICD-10-CM | POA: Diagnosis not present

## 2016-12-19 ENCOUNTER — Other Ambulatory Visit: Payer: Self-pay | Admitting: Internal Medicine

## 2016-12-27 ENCOUNTER — Other Ambulatory Visit: Payer: Self-pay | Admitting: Internal Medicine

## 2016-12-29 DIAGNOSIS — I10 Essential (primary) hypertension: Secondary | ICD-10-CM | POA: Diagnosis not present

## 2016-12-29 DIAGNOSIS — Z6830 Body mass index (BMI) 30.0-30.9, adult: Secondary | ICD-10-CM | POA: Diagnosis not present

## 2016-12-29 DIAGNOSIS — M545 Low back pain: Secondary | ICD-10-CM | POA: Diagnosis not present

## 2017-01-19 DIAGNOSIS — L4050 Arthropathic psoriasis, unspecified: Secondary | ICD-10-CM | POA: Diagnosis not present

## 2017-01-19 DIAGNOSIS — Z79899 Other long term (current) drug therapy: Secondary | ICD-10-CM | POA: Diagnosis not present

## 2017-01-19 DIAGNOSIS — M5136 Other intervertebral disc degeneration, lumbar region: Secondary | ICD-10-CM | POA: Diagnosis not present

## 2017-02-09 ENCOUNTER — Encounter: Payer: Medicare Other | Admitting: Internal Medicine

## 2017-03-12 DIAGNOSIS — Z1231 Encounter for screening mammogram for malignant neoplasm of breast: Secondary | ICD-10-CM | POA: Diagnosis not present

## 2017-03-25 ENCOUNTER — Other Ambulatory Visit: Payer: Self-pay | Admitting: Internal Medicine

## 2017-04-26 NOTE — Progress Notes (Signed)
Chief Complaint  Patient presents with  . Annual Exam    Not fasting. No new concerns    HPI: Michelle [name] 77 y.o. comes in today fyearly visit  Medicare Chronic disease management Bp is  140 range   At home but better in offices  No   Price of chlorthalidone recently 90 for 90 days  Asks about cost and alternative s  Back   ses dr Fatima Blank  Yearly  Dr Dossie Der is  Rheum and seen jan labs    Every 4 mos  Lab monitoring   arthritic seeming lu stable Had [facility] never go [facility] Health Maintenance  Topic Date Due  . DEXA SCAN  05/01/2006  . TETANUS/TDAP  02/05/2016  . COLONOSCOPY  12/01/2025  . INFLUENZA VACCINE  Completed  . PNA vac Low Risk Adult  Completed   Health Maintenance Review LIFESTYLE:  Exercise:   Exercise  gyme 3 d per week   And fitness cals  And walking  Tobacco/ETS:  no Alcohol:   no Sugar beverages: sweet tea  One a day.  Sleep: 8-9  Drug use: no HH:  1 no pets    Hearing:  Good   Vision:  No limitations at present . Last eye check UTD  Safety:  Has smoke detector and wears seat belts.  No firearms. No excess sun exposure. Sees dentist regularly.  Falls:   Advance directive :  Reviewed  Has one.  Memory: Felt to be good  , no concern from her or her family.  Depression: No anhedonia unusual crying or depressive symptoms  Nutrition: Eats well balanced diet; adequate calcium and vitamin D. No swallowing chewing problems.  Injury: no major injuries in the last six months.  Other healthcare providers:  Reviewed today .  Social:  Lives alone . No pets.   Preventive parameters: up-to-date  Reviewed  ADLS:   There are no problems or need for assistance  driving, feeding, obtaining food, dressing, toileting and bathing, managing money using phone. She is independent. ROS:  GEN/ HEENT: No fever, significant weight changes sweats headaches vision problems hearing changes, CV/ PULM; No chest pain shortness of breath cough, syncope,edema  change in  exercise tolerance. GI /GU: No adominal pain, vomiting, change in bowel habits. No blood in the stool. No significant GU symptoms. SKIN/HEME: ,no acute skin rashes suspicious lesions or bleeding. No lymphadenopathy, nodules, masses.  NEURO/ PSYCH:  No neurologic signs such as weakness numbness. No depression anxiety. IMM/ Allergy: No unusual infections.  Allergy .   REST of 12 system review negative except as per HPI   Past Medical History:  Diagnosis Date  . 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 Hypertension   . Osteopenia    Dr.John McComb    Family History  Problem Relation Age of Onset  . Kidney cancer Father        prostate   . Cancer Father   . Hypertension Mother        died at age 6   . Autoimmune disease Mother        Hemolytic Anemia  . Breast cancer Maternal Aunt   . Hypertension Maternal Aunt   . Stroke Maternal Grandfather 72  . Osteoporosis Unknown        Mother, 2 M aunts , MGM  . Atrial fibrillation Daughter 80  . Diabetes Neg Hx   . Heart disease Neg Hx     Social History   Socioeconomic History  .  Marital status: Widowed    Spouse name: Not on file  . Number of children: Not on file  . Years of education: Not on file  . Highest education level: Not on file  Occupational History  . Occupation: Retired  Scientific laboratory technician  . Financial resource strain: Not on file  . Food insecurity:    Worry: Not on file    Inability: Not on file  . Transportation needs:    Medical: Not on file    Non-medical: Not on file  Tobacco Use  . Smoking status: Never Smoker  . Smokeless tobacco: Never Used  Substance and Sexual Activity  . Alcohol use: No  . Drug use: No  . Sexual activity: Not on file  Lifestyle  . Physical activity:    Days per week: Not on file    Minutes per session: Not on file  . Stress: Not on file  Relationships  . Social connections:    Talks on phone: Not on file    Gets together: Not on file    Attends religious service: Not on file      Active member of club or organization: Not on file    Attends meetings of clubs or organizations: Not on file    Relationship status: Not on file  Other Topics Concern  . Not on file  Social History Narrative   9 hours of sleep per night   Single living in the home (widow)   No pets   Retired   Children 3  . 2 local 1 in [place] and  then [facility] back to [employer] with exercise.   Childbirth x3    Outpatient Encounter Medications as of 04/28/2017  Medication Sig  . Calcium Carbonate (CALCIUM 600 PO) Take 1 tablet by mouth daily.  . chlorthalidone (HYGROTON) 25 MG tablet TAKE 1/2-1 TABLET BY MOUTH EVERY DAY AS DIRECTED  . Cholecalciferol (VITAMIN D) 2000 UNITS CAPS Take 1 capsule by mouth daily.   . folic acid (FOLVITE) 1 MG tablet Take 1 mg by mouth daily.    Marland Kitchen losartan (COZAAR) 100 MG tablet TAKE 1 TABLET BY MOUTH EVERY DAY  . methotrexate (RHEUMATREX) 2.5 MG tablet Take 12.5 mg by mouth once a week. Caution:Chemotherapy. Protect from light. TAKES ON SAT  . metoprolol succinate (TOPROL-XL) 50 MG 24 hr tablet TAKE 1 TABLET BY MOUTH EVERY DAY  . Multiple Vitamin (MULTIVITAMIN) tablet Take 1 tablet by mouth daily.  . vitamin C (ASCORBIC ACID) 500 MG tablet Take 500 mg by mouth daily.   No facility-administered encounter medications on file as of 04/28/2017.     EXAM:  BP 122/76 (BP Location: Right Arm, Patient Position: Sitting, Cuff Size: Large)   Pulse 67   Temp 97.9 F (36.6 C) (Oral)   Ht 5' 1.25" (1.556 m)   Wt 166 lb 8 oz (75.5 kg)   LMP 04/19/1997   BMI 31.20 kg/m   Body mass index is 31.2 kg/m.  Physical Exam: Vital signs reviewed WTU:UEKC is a well-developed well-nourished alert cooperative   who appears stated age in no acute distress.  HEENT: normocephalic atraumatic , Eyes: PERRL EOM's full, conjunctiva clear, Nares: paten,t no deformity discharge or tenderness., Ears: no deformity EAC's clear TMs with normal landmarks.  Mouth: clear OP, no lesions, edema.  Moist mucous membranes. Dentition in adequate repair. NECK: supple without masses, thyromegaly or bruits. CHEST/PULM:  Clear to auscultation and  percussion breath sounds equal no wheeze , rales or rhonchi. No chest wall deformities or tenderness.Breast: normal by inspection . No dimpling, discharge, masses, tenderness or discharge . CV: PMI is nondisplaced, S1 S2 no gallops, murmurs, rubs. Peripheral pulses are full without delay.No JVD .  ABDOMEN: Bowel sounds normal nontender  No guard or rebound, no hepato splenomegal no CVA tenderness.   Extremtities:  No clubbing cyanosis or edema, no acute joint swelling or redness no focal atrophy obvious  NEURO:  Oriented x3, cranial nerves 3-12 appear to be intact, no obvious focal weakness,gait within normal limits no abnormal reflexes or asymmetrical SKIN: No acute rashes normal turgor, color, no bruising or petechiae. PSYCH: Oriented, good eye contact, no obvious depression anxiety, cognition and judgment appear normal. LN: no cervical axillary adenopathy No noted deficits in memory, attention, and speech.   Lab Results  Component Value Date   WBC 8.8 11/20/2015   HGB 14.1 11/20/2015   HCT 41.5 11/20/2015   PLT 363.0 11/20/2015   GLUCOSE 89 11/20/2015   CHOL 157 11/13/2014   TRIG 146.0 11/13/2014   HDL 50.30 11/13/2014   LDLCALC 78 11/13/2014   ALT 17 11/13/2014   AST 22 11/13/2014   NA 138 11/20/2015   K 3.7 11/20/2015   CL 101 11/20/2015   CREATININE 0.61 11/20/2015   BUN 13 11/20/2015   CO2 28 11/20/2015   TSH 1.12 11/13/2014   HGBA1C 5.5 11/20/2015  reviewed   Note  Last dr Dossie Der  ASSESSMENT AND PLAN:  Discussed the following assessment and plan:  Essential hypertension - Plan: Basic metabolic panel, Lipid panel  Medication management - Plan: Basic metabolic panel, Lipid panel  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 Total visit 28mns > 50% spent counseling and coordinating care as indicated in above note and in instructions to patient .   Patient Care Team: Panosh, WStandley Brooking MD as PCP - General (Internal Medicine) MArvella Nigh MD as Consulting Physician (Obstetrics and Gynecology) HRalene Bathe MD as Consulting Physician (Ophthalmology) JNewman Pies MD as Consulting Physician (Neurosurgery) SValinda Party MD (Rheumatology)  Patient Instructions  Glad you are doing well .  [facility]  [facility]  But may not be in your network plan   90 pills is $36.44 for Non-Members $29.60 or members  Quoted at [employer] For chlorthalidone  25 mg   Ask   Dr SDossie Derabout shingrix  Vaccine but should be ok .  Make sure you get your mammogram report .      Health Maintenance, Female Adopting a healthy lifestyle and getting preventive care can go a long way to promote health and wellness. Talk with your health care provider about what schedule of regular examinations is right for you. This is a good chance for you to check in with your provider about disease prevention and staying healthy. In between checkups, there are plenty of things you can do on your own. Experts have done a lot of research about which lifestyle changes and preventive measures are most likely to keep you healthy. Ask your health care provider for more information. Weight and diet Eat a healthy diet  Be sure to include  plenty of vegetables, fruits, low-fat dairy products, and lean protein.  Do not eat a lot of foods high in solid fats, added sugars, or salt.  Get regular exercise. This is one of the most important things you can do for your health. ? Most adults should exercise for at least 150 minutes each week. The exercise should  increase your heart rate and make you sweat (moderate-intensity exercise). ? Most adults should also do strengthening exercises at least twice a week. This is in addition to the moderate-intensity exercise.  Maintain a healthy weight  Body mass index (BMI) is a measurement that can be used to identify possible weight problems. It estimates body fat based on height and weight. Your health care provider can help determine your BMI and help you achieve or maintain a healthy weight.  For females 82 years of age and older: ? A BMI below 18.5 is considered underweight. ? A BMI of 18.5 to 24.9 is normal. ? A BMI of 25 to 29.9 is considered overweight. ? A BMI of 30 and above is considered obese.  Watch levels of cholesterol and blood lipids  You should start having your blood tested for lipids and cholesterol at 76 years of age, then have this test every 5 years.  You may need to have your cholesterol levels checked more often if: ? Your lipid or cholesterol levels are high. ? You are older than 76 years of age. ? You are at high risk for heart disease.  Cancer screening Lung Cancer  Lung cancer screening is recommended for adults 25-62 years old who are at high risk for lung cancer because of a history of smoking.  A yearly low-dose CT scan of the lungs is recommended for people who: ? Currently smoke. ? Have quit within the past 15 years. ? Have at least a 30-pack-year history of smoking. A pack year is smoking an average of one pack of cigarettes a day for 1 year.  Yearly screening should continue until it has been 15 years since you quit.  Yearly screening should stop if you develop a health problem that would prevent you from having lung cancer treatment.  Breast Cancer  Practice breast self-awareness. This means understanding how your breasts normally appear and feel.  It also means doing regular breast self-exams. Let your health care provider know about any changes, no matter  how small.  If you are in your 20s or 30s, you should have a clinical breast exam (CBE) by a health care provider every 1-3 years as part of a regular health exam.  If you are 8 or older, have a CBE every year. Also consider having a breast X-ray (mammogram) every year.  If you have a family history of breast cancer, talk to your health care provider about genetic screening.  If you are at high risk for breast cancer, talk to your health care provider about having an MRI and a mammogram every year.  Breast cancer gene (BRCA) assessment is recommended for women who have family members with BRCA-related cancers. BRCA-related cancers include: ? Breast. ? Ovarian. ? Tubal. ? Peritoneal cancers.  Results of the assessment will determine the need for genetic counseling and BRCA1 and BRCA2 testing.  Cervical Cancer Your health care provider may recommend that you be screened regularly for cancer of the pelvic organs (ovaries, uterus, and vagina). This screening involves a pelvic examination, including checking for microscopic changes to the surface of your cervix (Pap test). You may be  encouraged to have this screening done every 3 years, beginning at age 34.  For women ages 36-65, health care providers may recommend pelvic exams and Pap testing every 3 years, or they may recommend the Pap and pelvic exam, combined with testing for human papilloma virus (HPV), every 5 years. Some types of HPV increase your risk of cervical cancer. Testing for HPV may also be done on women of any age with unclear Pap test results.  Other health care providers may not recommend any screening for nonpregnant women who are considered low risk for pelvic cancer and who do not have symptoms. Ask your health care provider if a screening pelvic exam is right for you.  If you have had past treatment for cervical cancer or a condition that could lead to cancer, you need Pap tests and screening for cancer for at least 20  years after your treatment. If Pap tests have been discontinued, your risk factors (such as having a new sexual partner) need to be reassessed to determine if screening should resume. Some women have medical problems that increase the chance of getting cervical cancer. In these cases, your health care provider may recommend more frequent screening and Pap tests.  Colorectal Cancer  This type of cancer can be detected and often prevented.  Routine colorectal cancer screening usually begins at 76 years of age and continues through 76 years of age.  Your health care provider may recommend screening at an earlier age if you have risk factors for colon cancer.  Your health care provider may also recommend using home test kits to check for hidden blood in the stool.  A small camera at the end of a tube can be used to examine your colon directly (sigmoidoscopy or colonoscopy). This is done to check for the earliest forms of colorectal cancer.  Routine screening usually begins at age 67.  Direct examination of the colon should be repeated every 5-10 years through 76 years of age. However, you may need to be screened more often if early forms of precancerous polyps or small growths are found.  Skin Cancer  Check your skin from head to toe regularly.  Tell your health care provider about any new moles or changes in moles, especially if there is a change in a mole's shape or color.  Also tell your health care provider if you have a mole that is larger than the size of a pencil eraser.  Always use sunscreen. Apply sunscreen liberally and repeatedly throughout the day.  Protect yourself by wearing long sleeves, pants, a wide-brimmed hat, and sunglasses whenever you are outside.  Heart disease, diabetes, and high blood pressure  High blood pressure causes heart disease and increases the risk of stroke. High blood pressure is more likely to develop in: ? People who have blood pressure in the high  end of the normal range (130-139/85-89 mm Hg). ? People who are overweight or obese. ? People who are African American.  If you are 38-29 years of age, have your blood pressure checked every 3-5 years. If you are 37 years of age or older, have your blood pressure checked every year. You should have your blood pressure measured twice-once when you are at a hospital or clinic, and once when you are not at a hospital or clinic. Record the average of the two measurements. To check your blood pressure when you are not at a hospital or clinic, you can use: ? An automated blood pressure machine at a  pharmacy. ? A home blood pressure monitor.  If you are between 33 years and 61 years old, ask your health care provider if you should take aspirin to prevent strokes.  Have regular diabetes screenings. This involves taking a blood sample to check your fasting blood sugar level. ? If you are at a normal weight and have a low risk for diabetes, have this test once every three years after 76 years of age. ? If you are overweight and have a high risk for diabetes, consider being tested at a younger age or more often. Preventing infection Hepatitis B  If you have a higher risk for hepatitis B, you should be screened for this virus. You are considered at high risk for hepatitis B if: ? You were born in a country where hepatitis B is common. Ask your health care provider which countries are considered high risk. ? Your parents were born in a high-risk country, and you have not been immunized against hepatitis B (hepatitis B vaccine). ? You have HIV or AIDS. ? You use needles to inject street drugs. ? You live with someone who has hepatitis B. ? You have had sex with someone who has hepatitis B. ? You get hemodialysis treatment. ? You take certain medicines for conditions, including cancer, organ transplantation, and autoimmune conditions.  Hepatitis C  Blood testing is recommended for: ? Everyone born from  [birth year] through 1964. ? Anyone with known risk factors for hepatitis C.  Sexually transmitted infections (STIs)  You should be screened for sexually transmitted infections (STIs) including gonorrhea and chlamydia if: ? You are sexually active and are younger than 76 years of age. ? You are older than 76 years of age and your health care provider tells you that you are at risk for this type of infection. ? Your sexual activity has changed since you were last screened and you are at an increased risk for chlamydia or gonorrhea. Ask your health care provider if you are at risk.  If you do not have HIV, but are at risk, it may be recommended that you take a prescription medicine daily to prevent HIV infection. This is called pre-exposure prophylaxis (PrEP). You are considered at risk if: ? You are sexually active and do not regularly use condoms or know the HIV status of your partner(s). ? You take drugs by injection. ? You are sexually active with a partner who has HIV.  Talk with your health care provider about whether you are at high risk of being infected with HIV. If you choose to begin PrEP, you should first be tested for HIV. You should then be tested every 3 months for as long as you are taking PrEP. Pregnancy  If you are premenopausal and you may become pregnant, ask your health care provider about preconception counseling.  If you may become pregnant, take 400 to 800 micrograms (mcg) of folic acid every day.  If you want to prevent pregnancy, talk to your health care provider about birth control (contraception). Osteoporosis and menopause  Osteoporosis is a disease in which the bones lose minerals and strength with aging. This can result in serious bone fractures. Your risk for osteoporosis can be identified using a bone density scan.  If you are 68 years of age or older, or if you are at risk for osteoporosis and fractures, ask your health care provider if you should be  screened.  Ask your health care provider whether you should take a calcium or vitamin  D supplement to lower your risk for osteoporosis.  Menopause may have certain physical symptoms and risks.  Hormone replacement therapy may reduce some of these symptoms and risks. Talk to your health care provider about whether hormone replacement therapy is right for you. Follow these instructions at home:  Schedule regular health, dental, and eye exams.  Stay current with your immunizations.  Do not use any tobacco products including cigarettes, chewing tobacco, or electronic cigarettes.  If you are pregnant, do not drink alcohol.  If you are breastfeeding, limit how much and how often you drink alcohol.  Limit alcohol intake to no more than 1 drink per day for nonpregnant women. One drink equals 12 ounces of beer, 5 ounces of wine, or 1 ounces of hard liquor.  Do not use street drugs.  Do not share needles.  Ask your health care provider for help if you need support or information about quitting drugs.  Tell your health care provider if you often feel depressed.  Tell your health care provider if you have ever been abused or do not feel safe at home. This information is not intended to replace advice given to you by your health care provider. Make sure you discuss any questions you have with your health care provider. Document Released: 08/04/2010 Document Revised: 06/27/2015 Document Reviewed: 10/23/2014 Elsevier Interactive Patient Education  2018 [place]. Panosh M.D.

## 2017-04-28 ENCOUNTER — Encounter: Payer: Self-pay | Admitting: Internal Medicine

## 2017-04-28 ENCOUNTER — Ambulatory Visit (INDEPENDENT_AMBULATORY_CARE_PROVIDER_SITE_OTHER): Payer: Medicare Other | Admitting: Internal Medicine

## 2017-04-28 ENCOUNTER — Encounter: Payer: Self-pay | Admitting: *Deleted

## 2017-04-28 VITALS — BP 122/76 | HR 67 | Temp 97.9°F | Ht 61.25 in | Wt 166.5 lb

## 2017-04-28 DIAGNOSIS — I10 Essential (primary) hypertension: Secondary | ICD-10-CM

## 2017-04-28 DIAGNOSIS — L4050 Arthropathic psoriasis, unspecified: Secondary | ICD-10-CM | POA: Diagnosis not present

## 2017-04-28 DIAGNOSIS — Z13220 Encounter for screening for lipoid disorders: Secondary | ICD-10-CM

## 2017-04-28 DIAGNOSIS — Z79899 Other long term (current) drug therapy: Secondary | ICD-10-CM

## 2017-04-28 DIAGNOSIS — Z9889 Other specified postprocedural states: Secondary | ICD-10-CM

## 2017-04-28 NOTE — Progress Notes (Signed)
ERROR

## 2017-04-28 NOTE — Patient Instructions (Addendum)
Glad you are doing well .  Consider getting  Med at [facility] or other   Pharmacy  But may not be in your network plan   90 pills is $36.44 for Non-Members $29.60 or members  Quoted at [employer]  For chlorthalidone  25 mg   Ask   Dr Dossie Der about shingrix  Vaccine but should be ok .  Make sure you get your mammogram report .      Health Maintenance, Female Adopting a healthy lifestyle and getting preventive care can go a long way to promote health and wellness. Talk with your health care provider about what schedule of regular examinations is right for you. This is a good chance for you to check in with your provider about disease prevention and staying healthy. In between checkups, there are plenty of things you can do on your own. Experts have done a lot of research about which lifestyle changes and preventive measures are most likely to keep you healthy. Ask your health care provider for more information. Weight and diet Eat a healthy diet  Be sure to include plenty of vegetables, fruits, low-fat dairy products, and lean protein.  Do not eat a lot of foods high in solid fats, added sugars, or salt.  Get regular exercise. This is one of the most important things you can do for your health. ? Most adults should exercise for at least 150 minutes each week. The exercise should increase your heart rate and make you sweat (moderate-intensity exercise). ? Most adults should also do strengthening exercises at least twice a week. This is in addition to the moderate-intensity exercise.  Maintain a healthy weight  Body mass index (BMI) is a measurement that can be used to identify possible weight problems. It estimates body fat based on height and weight. Your health care provider can help determine your BMI and help you achieve or maintain a healthy weight.  For females 65 years of age and older: ? A BMI below 18.5 is considered underweight. ? A BMI of 18.5 to 24.9 is normal. ? A BMI of 25 to 29.9  is considered overweight. ? A BMI of 30 and above is considered obese.  Watch levels of cholesterol and blood lipids  You should start having your blood tested for lipids and cholesterol at 76 years of age, then have this test every 5 years.  You may need to have your cholesterol levels checked more often if: ? Your lipid or cholesterol levels are high. ? You are older than 76 years of age. ? You are at high risk for heart disease.  Cancer screening Lung Cancer  Lung cancer screening is recommended for adults 54-56 years old who are at high risk for lung cancer because of a history of smoking.  A yearly low-dose CT scan of the lungs is recommended for people who: ? Currently smoke. ? Have quit within the past 15 years. ? Have at least a 30-pack-year history of smoking. A pack year is smoking an average of one pack of cigarettes a day for 1 year.  Yearly screening should continue until it has been 15 years since you quit.  Yearly screening should stop if you develop a health problem that would prevent you from having lung cancer treatment.  Breast Cancer  Practice breast self-awareness. This means understanding how your breasts normally appear and feel.  It also means doing regular breast self-exams. Let your health care provider know about any changes, no matter how small.  If you are in your 20s or 30s, you should have a clinical breast exam (CBE) by a health care provider every 1-3 years as part of a regular health exam.  If you are 49 or older, have a CBE every year. Also consider having a breast X-ray (mammogram) every year.  If you have a family history of breast cancer, talk to your health care provider about genetic screening.  If you are at high risk for breast cancer, talk to your health care provider about having an MRI and a mammogram every year.  Breast cancer gene (BRCA) assessment is recommended for women who have family members with BRCA-related cancers.  BRCA-related cancers include: ? Breast. ? Ovarian. ? Tubal. ? Peritoneal cancers.  Results of the assessment will determine the need for genetic counseling and BRCA1 and BRCA2 testing.  Cervical Cancer Your health care provider may recommend that you be screened regularly for cancer of the pelvic organs (ovaries, uterus, and vagina). This screening involves a pelvic examination, including checking for microscopic changes to the surface of your cervix (Pap test). You may be encouraged to have this screening done every 3 years, beginning at age 75.  For women ages 7-65, health care providers may recommend pelvic exams and Pap testing every 3 years, or they may recommend the Pap and pelvic exam, combined with testing for human papilloma virus (HPV), every 5 years. Some types of HPV increase your risk of cervical cancer. Testing for HPV may also be done on women of any age with unclear Pap test results.  Other health care providers may not recommend any screening for nonpregnant women who are considered low risk for pelvic cancer and who do not have symptoms. Ask your health care provider if a screening pelvic exam is right for you.  If you have had past treatment for cervical cancer or a condition that could lead to cancer, you need Pap tests and screening for cancer for at least 20 years after your treatment. If Pap tests have been discontinued, your risk factors (such as having a new sexual partner) need to be reassessed to determine if screening should resume. Some women have medical problems that increase the chance of getting cervical cancer. In these cases, your health care provider may recommend more frequent screening and Pap tests.  Colorectal Cancer  This type of cancer can be detected and often prevented.  Routine colorectal cancer screening usually begins at 76 years of age and continues through 76 years of age.  Your health care provider may recommend screening at an earlier age if  you have risk factors for colon cancer.  Your health care provider may also recommend using home test kits to check for hidden blood in the stool.  A small camera at the end of a tube can be used to examine your colon directly (sigmoidoscopy or colonoscopy). This is done to check for the earliest forms of colorectal cancer.  Routine screening usually begins at age 47.  Direct examination of the colon should be repeated every 5-10 years through 76 years of age. However, you may need to be screened more often if early forms of precancerous polyps or small growths are found.  Skin Cancer  Check your skin from head to toe regularly.  Tell your health care provider about any new moles or changes in moles, especially if there is a change in a mole's shape or color.  Also tell your health care provider if you have a mole that is  larger than the size of a pencil eraser.  Always use sunscreen. Apply sunscreen liberally and repeatedly throughout the day.  Protect yourself by wearing long sleeves, pants, a wide-brimmed hat, and sunglasses whenever you are outside.  Heart disease, diabetes, and high blood pressure  High blood pressure causes heart disease and increases the risk of stroke. High blood pressure is more likely to develop in: ? People who have blood pressure in the high end of the normal range (130-139/85-89 mm Hg). ? People who are overweight or obese. ? People who are African American.  If you are 19-59 years of age, have your blood pressure checked every 3-5 years. If you are 63 years of age or older, have your blood pressure checked every year. You should have your blood pressure measured twice-once when you are at a hospital or clinic, and once when you are not at a hospital or clinic. Record the average of the two measurements. To check your blood pressure when you are not at a hospital or clinic, you can use: ? An automated blood pressure machine at a pharmacy. ? A home blood  pressure monitor.  If you are between 38 years and 45 years old, ask your health care provider if you should take aspirin to prevent strokes.  Have regular diabetes screenings. This involves taking a blood sample to check your fasting blood sugar level. ? If you are at a normal weight and have a low risk for diabetes, have this test once every three years after 76 years of age. ? If you are overweight and have a high risk for diabetes, consider being tested at a younger age or more often. Preventing infection Hepatitis B  If you have a higher risk for hepatitis B, you should be screened for this virus. You are considered at high risk for hepatitis B if: ? You were born in a country where hepatitis B is common. Ask your health care provider which countries are considered high risk. ? Your parents were born in a high-risk country, and you have not been immunized against hepatitis B (hepatitis B vaccine). ? You have HIV or AIDS. ? You use needles to inject street drugs. ? You live with someone who has hepatitis B. ? You have had sex with someone who has hepatitis B. ? You get hemodialysis treatment. ? You take certain medicines for conditions, including cancer, organ transplantation, and autoimmune conditions.  Hepatitis C  Blood testing is recommended for: ? Everyone born from [birth year] through 1964. ? Anyone with known risk factors for hepatitis C.  Sexually transmitted infections (STIs)  You should be screened for sexually transmitted infections (STIs) including gonorrhea and chlamydia if: ? You are sexually active and are younger than 76 years of age. ? You are older than 76 years of age and your health care provider tells you that you are at risk for this type of infection. ? Your sexual activity has changed since you were last screened and you are at an increased risk for chlamydia or gonorrhea. Ask your health care provider if you are at risk.  If you do not have HIV, but are at risk,  it may be recommended that you take a prescription medicine daily to prevent HIV infection. This is called pre-exposure prophylaxis (PrEP). You are considered at risk if: ? You are sexually active and do not regularly use condoms or know the HIV status of your partner(s). ? You take drugs by injection. ? You are sexually active  with a partner who has HIV.  Talk with your health care provider about whether you are at high risk of being infected with HIV. If you choose to begin PrEP, you should first be tested for HIV. You should then be tested every 3 months for as long as you are taking PrEP. Pregnancy  If you are premenopausal and you may become pregnant, ask your health care provider about preconception counseling.  If you may become pregnant, take 400 to 800 micrograms (mcg) of folic acid every day.  If you want to prevent pregnancy, talk to your health care provider about birth control (contraception). Osteoporosis and menopause  Osteoporosis is a disease in which the bones lose minerals and strength with aging. This can result in serious bone fractures. Your risk for osteoporosis can be identified using a bone density scan.  If you are 76 years of age or older, or if you are at risk for osteoporosis and fractures, ask your health care provider if you should be screened.  Ask your health care provider whether you should take a calcium or vitamin D supplement to lower your risk for osteoporosis.  Menopause may have certain physical symptoms and risks.  Hormone replacement therapy may reduce some of these symptoms and risks. Talk to your health care provider about whether hormone replacement therapy is right for you. Follow these instructions at home:  Schedule regular health, dental, and eye exams.  Stay current with your immunizations.  Do not use any tobacco products including cigarettes, chewing tobacco, or electronic cigarettes.  If you are pregnant, do not drink  alcohol.  If you are breastfeeding, limit how much and how often you drink alcohol.  Limit alcohol intake to no more than 1 drink per day for nonpregnant women. One drink equals 12 ounces of beer, 5 ounces of wine, or 1 ounces of hard liquor.  Do not use street drugs.  Do not share needles.  Ask your health care provider for help if you need support or information about quitting drugs.  Tell your health care provider if you often feel depressed.  Tell your health care provider if you have ever been abused or do not feel safe at home. This information is not intended to replace advice given to you by your health care provider. Make sure you discuss any questions you have with your health care provider. Document Released: 08/04/2010 Document Revised: 06/27/2015 Document Reviewed: 10/23/2014 Elsevier Interactive Patient Education  [employer].

## 2017-04-29 LAB — LIPID PANEL
Cholesterol: 150 mg/dL (ref 0–200)
HDL: 60.3 mg/dL (ref >39.00)
LDL Cholesterol: 75 mg/dL (ref 0–99)
NonHDL: 90.09
Total CHOL/HDL Ratio: 2
Triglycerides: 76 mg/dL (ref 0.0–149.0)
VLDL: 15.2 mg/dL (ref 0.0–40.0)

## 2017-04-29 LAB — BASIC METABOLIC PANEL
BUN: 17 mg/dL (ref 6–23)
CO2: 31 meq/L (ref 19–32)
Calcium: 9.8 mg/dL (ref 8.4–10.5)
Chloride: 101 mEq/L (ref 96–112)
Creatinine, Ser: 0.71 mg/dL (ref 0.40–1.20)
GFR: 85.07 mL/min (ref >60.00)
GLUCOSE: 109 mg/dL — AB (ref 70–99)
POTASSIUM: 4.1 meq/L (ref 3.5–5.1)
SODIUM: 139 meq/L (ref 135–145)

## 2017-04-29 NOTE — Addendum Note (Signed)
Addended by: Elmer Picker on: 04/29/2017 07:50 AM   Modules accepted: Orders

## 2017-06-21 ENCOUNTER — Other Ambulatory Visit: Payer: Self-pay | Admitting: Internal Medicine

## 2017-06-22 ENCOUNTER — Other Ambulatory Visit: Payer: Self-pay | Admitting: Internal Medicine

## 2017-06-22 DIAGNOSIS — M5136 Other intervertebral disc degeneration, lumbar region: Secondary | ICD-10-CM | POA: Diagnosis not present

## 2017-06-22 DIAGNOSIS — Z79899 Other long term (current) drug therapy: Secondary | ICD-10-CM | POA: Diagnosis not present

## 2017-06-22 DIAGNOSIS — M1990 Unspecified osteoarthritis, unspecified site: Secondary | ICD-10-CM | POA: Diagnosis not present

## 2017-06-22 DIAGNOSIS — L4050 Arthropathic psoriasis, unspecified: Secondary | ICD-10-CM | POA: Diagnosis not present

## 2017-06-22 LAB — HEPATIC FUNCTION PANEL
ALT: 25 (ref 7–35)
AST: 21 (ref 13–35)
Alkaline Phosphatase: 99 (ref 25–125)
BILIRUBIN, TOTAL: 0.7

## 2017-06-22 LAB — CBC AND DIFFERENTIAL
HCT: 40 (ref 36–46)
Hemoglobin: 13.9 (ref 12.0–16.0)
PLATELETS: 347 (ref 150–399)
WBC: 8.4

## 2017-06-22 LAB — BASIC METABOLIC PANEL
BUN: 16 (ref 4–21)
Creatinine: 0.7 (ref 0.5–1.1)
GLUCOSE: 94
Potassium: 4.5 (ref 3.4–5.3)
SODIUM: 138 (ref 137–147)

## 2017-06-22 LAB — POCT ERYTHROCYTE SEDIMENTATION RATE, NON-AUTOMATED: SED RATE: 17

## 2017-06-26 ENCOUNTER — Other Ambulatory Visit: Payer: Self-pay | Admitting: Endocrinology

## 2017-06-26 DIAGNOSIS — E041 Nontoxic single thyroid nodule: Secondary | ICD-10-CM

## 2017-07-01 DIAGNOSIS — N958 Other specified menopausal and perimenopausal disorders: Secondary | ICD-10-CM | POA: Diagnosis not present

## 2017-07-01 DIAGNOSIS — Z01419 Encounter for gynecological examination (general) (routine) without abnormal findings: Secondary | ICD-10-CM | POA: Diagnosis not present

## 2017-07-01 DIAGNOSIS — Z6831 Body mass index (BMI) 31.0-31.9, adult: Secondary | ICD-10-CM | POA: Diagnosis not present

## 2017-07-07 ENCOUNTER — Encounter: Payer: Self-pay | Admitting: Internal Medicine

## 2017-07-13 ENCOUNTER — Ambulatory Visit
Admission: RE | Admit: 2017-07-13 | Discharge: 2017-07-13 | Disposition: A | Discharge status: Home / Self Care | Payer: Medicare Other | Source: Ambulatory Visit | Attending: Endocrinology | Admitting: Endocrinology

## 2017-07-13 DIAGNOSIS — E041 Nontoxic single thyroid nodule: Secondary | ICD-10-CM

## 2017-07-13 DIAGNOSIS — E042 Nontoxic multinodular goiter: Secondary | ICD-10-CM | POA: Diagnosis not present

## 2017-07-16 DIAGNOSIS — E041 Nontoxic single thyroid nodule: Secondary | ICD-10-CM | POA: Diagnosis not present

## 2017-08-12 DIAGNOSIS — E041 Nontoxic single thyroid nodule: Secondary | ICD-10-CM | POA: Diagnosis not present

## 2017-09-14 ENCOUNTER — Other Ambulatory Visit: Payer: Self-pay | Admitting: Internal Medicine

## 2017-11-10 DIAGNOSIS — Z79899 Other long term (current) drug therapy: Secondary | ICD-10-CM | POA: Diagnosis not present

## 2017-11-10 DIAGNOSIS — M1990 Unspecified osteoarthritis, unspecified site: Secondary | ICD-10-CM | POA: Diagnosis not present

## 2017-11-10 DIAGNOSIS — L4050 Arthropathic psoriasis, unspecified: Secondary | ICD-10-CM | POA: Diagnosis not present

## 2017-11-10 DIAGNOSIS — M5136 Other intervertebral disc degeneration, lumbar region: Secondary | ICD-10-CM | POA: Diagnosis not present

## 2017-11-10 DIAGNOSIS — J069 Acute upper respiratory infection, unspecified: Secondary | ICD-10-CM | POA: Diagnosis not present

## 2017-12-03 ENCOUNTER — Ambulatory Visit (INDEPENDENT_AMBULATORY_CARE_PROVIDER_SITE_OTHER): Payer: Medicare Other

## 2017-12-03 DIAGNOSIS — Z23 Encounter for immunization: Secondary | ICD-10-CM

## 2017-12-12 ENCOUNTER — Other Ambulatory Visit: Payer: Self-pay | Admitting: Internal Medicine

## 2017-12-17 ENCOUNTER — Other Ambulatory Visit: Payer: Self-pay | Admitting: Internal Medicine

## 2018-01-04 DIAGNOSIS — Z6830 Body mass index (BMI) 30.0-30.9, adult: Secondary | ICD-10-CM | POA: Diagnosis not present

## 2018-01-04 DIAGNOSIS — I10 Essential (primary) hypertension: Secondary | ICD-10-CM | POA: Diagnosis not present

## 2018-02-16 NOTE — Progress Notes (Signed)
Chief Complaint  Patient presents with  . Cough    x 9 days with congestion, nonproductive cough, had a fever over the weekend . Coughing is keeping her awake     HPI: Michelle [name] 78 y.o. come in for  abovew  Onset with sorte throat and ocld sx and then fever  Low grade and then cough  Is unbearable   Using otc coricidint and fluids rested and cough meds .   Feels wheezing .  Fever Sunday .  Not  Past few days  Chest feels tight and wheezy   ? Sob  No hx of lung disease  ROS: See pertinent positives and negatives per HPI.  Past Medical History:  Diagnosis Date  . 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 Hypertension   . Osteopenia    Dr.John McComb    Family History  Problem Relation Age of Onset  . Kidney cancer Father        prostate   . Cancer Father   . Hypertension Mother        died at age 105   . Autoimmune disease Mother        Hemolytic Anemia  . Breast cancer Maternal Aunt   . Hypertension Maternal Aunt   . Stroke Maternal Grandfather 7  . Osteoporosis Unknown        Mother, 2 M aunts , MGM  . Atrial fibrillation Daughter 65  . Diabetes Neg Hx   . Heart disease Neg Hx     Social History   Socioeconomic History  . Marital status: Widowed    Spouse name: Not on file  . Number of children: Not on file  . Years of education: Not on file  . Highest education level: Not on file  Occupational History  . Occupation: Retired  Scientific laboratory technician  . Financial resource strain: Not on file  . Food insecurity:    Worry: Not on file    Inability: Not on file  . Transportation needs:    Medical: Not on file    Non-medical: Not on file  Tobacco Use  . Smoking status: Never Smoker  . Smokeless tobacco: Never Used  Substance and Sexual Activity  . Alcohol use: No  . Drug use: No  . Sexual activity: Not on file  Lifestyle  . Physical activity:    Days per week: Not on file    Minutes per session: Not on file  . Stress: Not on file  Relationships  . Social  connections:    Talks on phone: Not on file    Gets together: Not on file    Attends religious service: Not on file    Active member of club or organization: Not on file    Attends meetings of clubs or organizations: Not on file    Relationship status: Not on file  Other Topics Concern  . Not on file  Social History Narrative   9 hours of sleep per night   Single living in the home (widow)   No pets   Retired   Children 3  . 2 local 1 in [place] and  then [place] back to [employer] with exercise.   Childbirth x3    Outpatient Medications Prior to Visit  Medication Sig Dispense Refill  . Calcium Carbonate (CALCIUM 600 PO) Take 1 tablet by mouth daily.    . chlorthalidone (HYGROTON) 25 MG  tablet TAKE 1/2-1 TABLET BY MOUTH EVERY DAY AS DIRECTED 90 tablet 1  . Cholecalciferol (VITAMIN D) 2000 UNITS CAPS Take 1 capsule by mouth daily.     . folic acid (FOLVITE) 1 MG tablet Take 1 mg by mouth daily.      Marland Kitchen losartan (COZAAR) 100 MG tablet TAKE 1 TABLET BY MOUTH EVERY DAY 90 tablet 0  . methotrexate (RHEUMATREX) 2.5 MG tablet Take 12.5 mg by mouth once a week. Caution:Chemotherapy. Protect from light. TAKES ON SAT    . metoprolol succinate (TOPROL-XL) 50 MG 24 hr tablet TAKE 1 TABLET BY MOUTH EVERY DAY 90 tablet 1  . Multiple Vitamin (MULTIVITAMIN) tablet Take 1 tablet by mouth daily.    . vitamin C (ASCORBIC ACID) 500 MG tablet Take 500 mg by mouth daily.     No facility-administered medications prior to visit.      EXAM:  BP 132/76 (BP Location: Right Arm, Patient Position: Sitting, Cuff Size: Large)   Pulse 70   Temp 98.4 F (36.9 C) (Oral)   Wt 166 lb 12.8 oz (75.7 kg)   LMP 04/19/1997   SpO2 92%   BMI 31.26 kg/m   Body mass index is 31.26 kg/m. WDWN in NAD  quiet respirations; mildly congested  somewhat hoarse. Non toxic . HEENT: Normocephalic ;atraumatic , Eyes;  PERRL, EOMs  Full, lids and conjunctiva clear,,Ears: no deformities,  canals nl, TM landmarks normal, Nose: no deformity or discharge but congested;face minimally tender Mouth : OP clear without lesion or edema . Neck: Supple without adenopathy or masses or bruits Chest:  bs = wheezy cough noted  ? Crackles  right base.   DuoNeb given and she has clinical improvement increased breath sounds decreased expiratory wheeze. CV:  S1-S2 no gallops or murmurs peripheral perfusion is normal Skin :nl perfusion and no acute rashes   Chest x-ray shows no acute disease or finding. BP Readings from Last 3 Encounters:  02/17/18 132/76  04/28/17 122/76  03/27/16 (!) 164/92    ASSESSMENT AND PLAN:  Discussed the following assessment and plan:  Lower respiratory infection (e.g., bronchitis, pneumonia, pneumonitis, pulmonitis) - Plan: DG Chest 2 View  Wheezy bronchitis - Plan: ipratropium-albuterol (DUONEB) 0.5-2.5 (3) MG/3ML nebulizer solution 3 mL  Wheezing-associated respiratory infection (WARI) High risk  imeds  Suspect wheezing associated respiratory infection with bronchospasm most likely viral but high risk close observation is indicated. Expectant management albuterol inhaler to try cough comfort at night fluids follow-up if persistent progressive or alarm symptoms.  -Patient advised to return or notify health care team  if  new concerns arise.  Patient Instructions  X ray shows no pneumonia  This is a wheezy bronchitis   At this time can try inhaler and ocugh med for comfort   Plain mucinex to loosen mucous .   If fever returns or increasing short of breath   Etc  Or not improving next week let us know or see   Timely care .     Bronchospasm, Adult  Bronchospasm is a tightening of the airways going into the lungs. During an episode, it may be harder to breathe. You may cough, and you may make a whistling sound when you breathe (wheeze). This condition often affects people with asthma. What are the causes? This condition is caused by swelling and  irritation in the airways. It can be triggered by:  An infection (common).  Seasonal allergies.  An allergic reaction.  Exercise.  Irritants. These include pollution, cigarette smoke,  strong odors, aerosol sprays, and paint fumes.  Weather changes. Winds increase molds and pollens in the air. Cold air may cause swelling.  Stress and emotional upset. What are the signs or symptoms? Symptoms of this condition include:  Wheezing. If the episode was triggered by an allergy, wheezing may start right away or hours later.  Nighttime coughing.  Frequent or severe coughing with a simple cold.  Chest tightness.  Shortness of breath.  Decreased ability to exercise. How is this diagnosed? This condition is usually diagnosed with a review of your medical history and a physical exam. Tests, such as lung function tests, are sometimes done to look for other conditions. The need for a chest X-ray depends on where the wheezing occurs and whether it is the first time you have wheezed. How is this treated? This condition may be treated with:  Inhaled medicines. These open up the airways and help you breathe. They can be taken with an inhaler or a nebulizer device.  Corticosteroid medicines. These may be given for severe bronchospasm, usually when it is associated with asthma.  Avoiding triggers, such as irritants, infection, or allergies. Follow these instructions at home: Medicines  Take over-the-counter and prescription medicines only as told by your health care provider.  If you need to use an inhaler or nebulizer to take your medicine, ask your health care provider to explain how to use it correctly. If you were given a spacer, always use it with your inhaler. Lifestyle  Reduce the number of triggers in your home. To do this: ? Change your heating and air conditioning filter at least once a month. ? Limit your use of fireplaces and wood stoves. ? Do not smoke. Do not allow smoking  in your home. ? Avoid using perfumes and fragrances. ? Get rid of pests, such as roaches and mice, and their droppings. ? Remove any mold from your home. ? Keep your house clean and dust free. Use unscented cleaning products. ? Replace carpet with wood, tile, or vinyl flooring. Carpet can trap dander and dust. ? Use allergy-proof pillows, mattress covers, and box spring covers. ? Wash bed sheets and blankets every week in hot water. Dry them in a dryer. ? Use blankets that are made of polyester or cotton. ? Wash your hands often. ? Do not allow pets in your bedroom.  Avoid breathing in cold air when you exercise. General instructions  Have a plan for seeking medical care. Know when to call your health care provider and local emergency services, and where to get emergency care.  Stay up to date on your immunizations.  When you have an episode of bronchospasm, stay calm. Try to relax and breathe more slowly.  If you have asthma, make sure you have an asthma action plan.  Keep all follow-up visits as told by your health care provider. This is important. Contact a health care provider if:  You have muscle aches.  You have chest pain.  The mucus that you cough up (sputum) changes from clear or white to yellow, green, gray, or bloody.  You have a fever.  Your sputum gets thicker. Get help right away if:  Your wheezing and coughing get worse, even after you take your prescribed medicines.  It gets even harder to breathe.  You develop severe chest pain. Summary  Bronchospasm is a tightening of the airways going into the lungs.  During an episode of bronchospasm, you may have a harder time breathing. You may cough and  make a whistling sound when you breathe (wheeze).  Avoid exposure to triggers such as smoke, dust, mold, animal dander, and fragrances.  When you have an episode of bronchospasm, stay calm. Try to relax and breathe more slowly. This information is not intended  to replace advice given to you by your health care provider. Make sure you discuss any questions you have with your health care provider. Document Released: 01/22/2003 Document Revised: 01/16/2016 Document Reviewed: 01/16/2016 Elsevier Interactive Patient Education  2019 [employer]. Panosh M.D.

## 2018-02-17 ENCOUNTER — Ambulatory Visit (INDEPENDENT_AMBULATORY_CARE_PROVIDER_SITE_OTHER): Payer: Medicare Other | Admitting: Internal Medicine

## 2018-02-17 ENCOUNTER — Ambulatory Visit (INDEPENDENT_AMBULATORY_CARE_PROVIDER_SITE_OTHER): Payer: Medicare Other

## 2018-02-17 ENCOUNTER — Encounter: Payer: Self-pay | Admitting: Internal Medicine

## 2018-02-17 VITALS — BP 132/76 | HR 70 | Temp 98.4°F | Wt 166.8 lb

## 2018-02-17 DIAGNOSIS — J40 Bronchitis, not specified as acute or chronic: Secondary | ICD-10-CM

## 2018-02-17 DIAGNOSIS — J988 Other specified respiratory disorders: Secondary | ICD-10-CM

## 2018-02-17 DIAGNOSIS — J22 Unspecified acute lower respiratory infection: Secondary | ICD-10-CM | POA: Diagnosis not present

## 2018-02-17 DIAGNOSIS — J209 Acute bronchitis, unspecified: Secondary | ICD-10-CM

## 2018-02-17 DIAGNOSIS — R05 Cough: Secondary | ICD-10-CM | POA: Diagnosis not present

## 2018-02-17 MED ORDER — IPRATROPIUM-ALBUTEROL 0.5-2.5 (3) MG/3ML IN SOLN
3.0000 mL | Freq: Once | RESPIRATORY_TRACT | Status: AC
  Administered 2018-02-17: 3 mL via RESPIRATORY_TRACT

## 2018-02-17 MED ORDER — ALBUTEROL SULFATE HFA 108 (90 BASE) MCG/ACT IN AERS: 2.0000 | INHALATION_SPRAY | Freq: Four times a day (QID) | RESPIRATORY_TRACT | 1 refills | Status: DC | PRN

## 2018-02-17 MED ORDER — HYDROCODONE-HOMATROPINE 5-1.5 MG/5ML PO SYRP: 5.0000 mL | ORAL_SOLUTION | Freq: Three times a day (TID) | ORAL | 0 refills | Status: DC | PRN

## 2018-02-17 NOTE — Patient Instructions (Signed)
X ray shows no pneumonia  This is a wheezy bronchitis   At this time can try inhaler and ocugh med for comfort   Plain mucinex to loosen mucous .   If fever returns or increasing short of breath   Etc  Or not improving next week let us know or see   Timely care .     Bronchospasm, Adult  Bronchospasm is a tightening of the airways going into the lungs. During an episode, it may be harder to breathe. You may cough, and you may make a whistling sound when you breathe (wheeze). This condition often affects people with asthma. What are the causes? This condition is caused by swelling and irritation in the airways. It can be triggered by:  An infection (common).  Seasonal allergies.  An allergic reaction.  Exercise.  Irritants. These include pollution, cigarette smoke, strong odors, aerosol sprays, and paint fumes.  Weather changes. Winds increase molds and pollens in the air. Cold air may cause swelling.  Stress and emotional upset. What are the signs or symptoms? Symptoms of this condition include:  Wheezing. If the episode was triggered by an allergy, wheezing may start right away or hours later.  Nighttime coughing.  Frequent or severe coughing with a simple cold.  Chest tightness.  Shortness of breath.  Decreased ability to exercise. How is this diagnosed? This condition is usually diagnosed with a review of your medical history and a physical exam. Tests, such as lung function tests, are sometimes done to look for other conditions. The need for a chest X-ray depends on where the wheezing occurs and whether it is the first time you have wheezed. How is this treated? This condition may be treated with:  Inhaled medicines. These open up the airways and help you breathe. They can be taken with an inhaler or a nebulizer device.  Corticosteroid medicines. These may be given for severe bronchospasm, usually when it is associated with asthma.  Avoiding triggers, such as  irritants, infection, or allergies. Follow these instructions at home: Medicines  Take over-the-counter and prescription medicines only as told by your health care provider.  If you need to use an inhaler or nebulizer to take your medicine, ask your health care provider to explain how to use it correctly. If you were given a spacer, always use it with your inhaler. Lifestyle  Reduce the number of triggers in your home. To do this: ? Change your heating and air conditioning filter at least once a month. ? Limit your use of fireplaces and wood stoves. ? Do not smoke. Do not allow smoking in your home. ? Avoid using perfumes and fragrances. ? Get rid of pests, such as roaches and mice, and their droppings. ? Remove any mold from your home. ? Keep your house clean and dust free. Use unscented cleaning products. ? Replace carpet with wood, tile, or vinyl flooring. Carpet can trap dander and dust. ? Use allergy-proof pillows, mattress covers, and box spring covers. ? Wash bed sheets and blankets every week in hot water. Dry them in a dryer. ? Use blankets that are made of polyester or cotton. ? Wash your hands often. ? Do not allow pets in your bedroom.  Avoid breathing in cold air when you exercise. General instructions  Have a plan for seeking medical care. Know when to call your health care provider and local emergency services, and where to get emergency care.  Stay up to date on your immunizations.  When you  have an episode of bronchospasm, stay calm. Try to relax and breathe more slowly.  If you have asthma, make sure you have an asthma action plan.  Keep all follow-up visits as told by your health care provider. This is important. Contact a health care provider if:  You have muscle aches.  You have chest pain.  The mucus that you cough up (sputum) changes from clear or white to yellow, green, gray, or bloody.  You have a fever.  Your sputum gets thicker. Get help right  away if:  Your wheezing and coughing get worse, even after you take your prescribed medicines.  It gets even harder to breathe.  You develop severe chest pain. Summary  Bronchospasm is a tightening of the airways going into the lungs.  During an episode of bronchospasm, you may have a harder time breathing. You may cough and make a whistling sound when you breathe (wheeze).  Avoid exposure to triggers such as smoke, dust, mold, animal dander, and fragrances.  When you have an episode of bronchospasm, stay calm. Try to relax and breathe more slowly. This information is not intended to replace advice given to you by your health care provider. Make sure you discuss any questions you have with your health care provider. Document Released: 01/22/2003 Document Revised: 01/16/2016 Document Reviewed: 01/16/2016 Elsevier Interactive Patient Education  2019 [employer].

## 2018-02-24 DIAGNOSIS — M1990 Unspecified osteoarthritis, unspecified site: Secondary | ICD-10-CM | POA: Diagnosis not present

## 2018-02-24 DIAGNOSIS — M5136 Other intervertebral disc degeneration, lumbar region: Secondary | ICD-10-CM | POA: Diagnosis not present

## 2018-02-24 DIAGNOSIS — J069 Acute upper respiratory infection, unspecified: Secondary | ICD-10-CM | POA: Diagnosis not present

## 2018-02-24 DIAGNOSIS — Z79899 Other long term (current) drug therapy: Secondary | ICD-10-CM | POA: Diagnosis not present

## 2018-02-24 DIAGNOSIS — L4050 Arthropathic psoriasis, unspecified: Secondary | ICD-10-CM | POA: Diagnosis not present

## 2018-02-24 LAB — BASIC METABOLIC PANEL
BUN: 13 (ref 4–21)
Creatinine: 0.7 (ref 0.5–1.1)
Glucose: 88
Potassium: 4.5 (ref 3.4–5.3)
Sodium: 140 (ref 137–147)

## 2018-02-24 LAB — CBC AND DIFFERENTIAL
HCT: 39 (ref 36–46)
Hemoglobin: 13.5 (ref 12.0–16.0)
Platelets: 361 (ref 150–399)
WBC: 12.2

## 2018-02-24 LAB — HEPATIC FUNCTION PANEL
ALT: 20 (ref 7–35)
AST: 20 (ref 13–35)
Alkaline Phosphatase: 107 (ref 25–125)
Bilirubin, Total: 0.5

## 2018-03-08 ENCOUNTER — Other Ambulatory Visit: Payer: Self-pay | Admitting: Internal Medicine

## 2018-03-18 DIAGNOSIS — Z1231 Encounter for screening mammogram for malignant neoplasm of breast: Secondary | ICD-10-CM | POA: Diagnosis not present

## 2018-03-18 DIAGNOSIS — Z803 Family history of malignant neoplasm of breast: Secondary | ICD-10-CM | POA: Diagnosis not present

## 2018-05-03 ENCOUNTER — Telehealth: Payer: Self-pay

## 2018-05-03 NOTE — Telephone Encounter (Signed)
Author phoned pt. to assess interest in scheduling virtual awv. No answer, author left detailed VM asking for return call if interested in awv with health coach or PCP.

## 2018-05-25 ENCOUNTER — Telehealth: Payer: Self-pay

## 2018-05-25 NOTE — Telephone Encounter (Signed)
lvm for pt to call back pt is due for a follow up appt for blood pressure. Crm created

## 2018-05-26 ENCOUNTER — Other Ambulatory Visit: Payer: Self-pay | Admitting: Internal Medicine

## 2018-06-23 ENCOUNTER — Encounter: Payer: Self-pay | Admitting: Internal Medicine

## 2018-06-28 DIAGNOSIS — M5136 Other intervertebral disc degeneration, lumbar region: Secondary | ICD-10-CM | POA: Diagnosis not present

## 2018-06-28 DIAGNOSIS — L4050 Arthropathic psoriasis, unspecified: Secondary | ICD-10-CM | POA: Diagnosis not present

## 2018-06-28 DIAGNOSIS — J069 Acute upper respiratory infection, unspecified: Secondary | ICD-10-CM | POA: Diagnosis not present

## 2018-06-28 DIAGNOSIS — L237 Allergic contact dermatitis due to plants, except food: Secondary | ICD-10-CM | POA: Diagnosis not present

## 2018-06-28 DIAGNOSIS — Z79899 Other long term (current) drug therapy: Secondary | ICD-10-CM | POA: Diagnosis not present

## 2018-06-28 DIAGNOSIS — M1990 Unspecified osteoarthritis, unspecified site: Secondary | ICD-10-CM | POA: Diagnosis not present

## 2018-08-25 ENCOUNTER — Other Ambulatory Visit: Payer: Self-pay | Admitting: Internal Medicine

## 2018-09-01 ENCOUNTER — Other Ambulatory Visit: Payer: Self-pay | Admitting: Internal Medicine

## 2018-10-05 DIAGNOSIS — M5136 Other intervertebral disc degeneration, lumbar region: Secondary | ICD-10-CM | POA: Diagnosis not present

## 2018-10-05 DIAGNOSIS — L4050 Arthropathic psoriasis, unspecified: Secondary | ICD-10-CM | POA: Diagnosis not present

## 2018-10-05 DIAGNOSIS — M1990 Unspecified osteoarthritis, unspecified site: Secondary | ICD-10-CM | POA: Diagnosis not present

## 2018-10-05 DIAGNOSIS — Z79899 Other long term (current) drug therapy: Secondary | ICD-10-CM | POA: Diagnosis not present

## 2018-10-05 LAB — BASIC METABOLIC PANEL
BUN: 19 (ref 4–21)
Creatinine: 0.8 (ref 0.5–1.1)
Glucose: 119
Potassium: 3.6 (ref 3.4–5.3)
Sodium: 138 (ref 137–147)

## 2018-10-05 LAB — CBC AND DIFFERENTIAL
HCT: 40 (ref 36–46)
Hemoglobin: 13.7 (ref 12.0–16.0)
Platelets: 389 (ref 150–399)
WBC: 12.3

## 2018-10-05 LAB — POCT ERYTHROCYTE SEDIMENTATION RATE, NON-AUTOMATED: Sed Rate: 20

## 2018-10-05 LAB — HEPATIC FUNCTION PANEL
ALT: 21 (ref 7–35)
AST: 22 (ref 13–35)
Alkaline Phosphatase: 111 (ref 25–125)
Bilirubin, Total: 0.4

## 2018-10-25 ENCOUNTER — Encounter: Payer: Self-pay | Admitting: Internal Medicine

## 2018-11-03 DIAGNOSIS — H52203 Unspecified astigmatism, bilateral: Secondary | ICD-10-CM | POA: Diagnosis not present

## 2018-11-03 DIAGNOSIS — H353131 Nonexudative age-related macular degeneration, bilateral, early dry stage: Secondary | ICD-10-CM | POA: Diagnosis not present

## 2018-11-07 NOTE — Progress Notes (Signed)
Chief Complaint  Patient presents with  . Medicare Wellness  . Medication Management  . Hypertension    HPI: Michelle [name] 78 y.o. comes in today for yearly visit  And wellness .Since last visit.   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    Bp has been good at doc visits    Had lab at rheum  Blood sugar was 119 NF needs fu.  See  Medicare AWV questionarre  Sent to scan   Health Maintenance  Topic Date Due  . DEXA SCAN  05/01/2006  . TETANUS/TDAP  02/05/2016  . INFLUENZA VACCINE  09/03/2018  . PNA vac Low Risk Adult  Completed   Health Maintenance Review LIFESTYLE:  Exercise:  Walking  Class  outside  Tobacco/ETS:n Alcohol: n Sugar beverages:n Sleep:8-9 Drug use: no HH:1 no pets  Daughter in [place] will be moving back  Depression screen [facility] 2/9 11/08/2018 04/28/2017 11/20/2015 11/13/2014 04/25/2013  Decreased Interest 0 0 0 0 0  Down, Depressed, Hopeless 0 0 0 0 0  PHQ - 2 Score 0 0 0 0 0      Hearing: ok  Vision:  No limitations at present .  Early MD   Safety:  Has smoke detector and wears seat belts.   No excess sun exposure. Sees dentist regularly.  Falls: n  Advance directive :  Reviewed  Has one.  Memory: Felt to be good  , no concern from her or her family.  Depression: No anhedonia unusual crying or depressive symptoms  Nutrition: Eats well balanced diet; adequate calcium and vitamin D. No swallowing chewing problems.  Injury: no major injuries in the last six months.  Other healthcare providers:  Reviewed today .  Preventive parameters: up-to-date  Reviewed  Sees dr Radene Knee  Breast exam and dexa stable   ADLS:   There are no problems or need for assistance  driving, feeding, obtaining food, dressing, toileting and bathing, managing money using phone. She is independent.   ROS: see hpi GEN/ HEENT: No  fever, significant weight changes sweats headaches vision problems hearing changes, CV/ PULM; No chest pain shortness of breath cough, syncope,edema  change in exercise tolerance. GI /GU: No adominal pain, vomiting, change in bowel habits. No blood in the stool. No significant GU symptoms. SKIN/HEME: ,no acute skin rashes suspicious lesions or bleeding. No lymphadenopathy, nodules, masses.  NEURO/ PSYCH:  No neurologic signs such as weakness numbness. No depression anxiety. IMM/ Allergy: No unusual infections.  Allergy .   REST of 12 system review negative except as per HPI   Past Medical History:  Diagnosis Date  . 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 Hypertension   . Osteopenia    Dr.John McComb    Family History  Problem Relation Age of Onset  . Kidney cancer Father        prostate   . Cancer Father   . Hypertension Mother        died at age 25   . Autoimmune disease Mother        Hemolytic Anemia  . Breast cancer Maternal Aunt   . Hypertension Maternal Aunt   . Stroke Maternal Grandfather 47  . Osteoporosis Unknown        Mother, 2 M aunts , MGM  . Atrial  fibrillation Daughter 48  . Diabetes Neg Hx   . Heart disease Neg Hx     Social History   Socioeconomic History  . Marital status: Widowed    Spouse name: Not on file  . Number of children: Not on file  . Years of education: Not on file  . Highest education level: Not on file  Occupational History  . Occupation: Retired  Scientific laboratory technician  . Financial resource strain: Not on file  . Food insecurity    Worry: Not on file    Inability: Not on file  . Transportation needs    Medical: Not on file    Non-medical: Not on file  Tobacco Use  . Smoking status: Never Smoker  . Smokeless tobacco: Never Used  Substance and Sexual Activity  . Alcohol use: No  . Drug use: No  . Sexual activity: Not on file  Lifestyle  . Physical activity    Days per week: Not on file    Minutes per session: Not on file  . Stress:  Not on file  Relationships  . Social Herbalist on phone: Not on file    Gets together: Not on file    Attends religious service: Not on file    Active member of club or organization: Not on file    Attends meetings of clubs or organizations: Not on file    Relationship status: Not on file  Other Topics Concern  . Not on file  Social History Narrative   9 hours of sleep per night   Single living in the home (widow)   No pets   Retired   Children 3  . 2 local 1 in [place] and  then [place] back to [employer] with exercise.   Childbirth x3    Outpatient Encounter Medications as of 11/08/2018  Medication Sig  . chlorthalidone (HYGROTON) 25 MG tablet Take 1 tablet (25 mg total) by mouth daily.  . Cholecalciferol (VITAMIN D) 2000 UNITS CAPS Take 1 capsule by mouth daily.   . folic acid (FOLVITE) 1 MG tablet Take 1 mg by mouth daily.    Marland Kitchen losartan (COZAAR) 100 MG tablet TAKE 1 TABLET BY MOUTH EVERY DAY  . methotrexate (RHEUMATREX) 2.5 MG tablet Take 12.5 mg by mouth once a week. Caution:Chemotherapy. Protect from light. TAKES ON SAT  . metoprolol succinate (TOPROL-XL) 50 MG 24 hr tablet TAKE 1 TABLET BY MOUTH EVERY DAY  . Multiple Vitamin (MULTIVITAMIN) tablet Take 1 tablet by mouth daily.  . Multiple Vitamins-Minerals (PRESERVISION AREDS) TABS Take 1 tablet by mouth 2 (two) times daily.  . [DISCONTINUED] chlorthalidone (HYGROTON) 25 MG tablet TAKE 1/2-1 TABLET BY MOUTH EVERY DAY AS DIRECTED  . [DISCONTINUED] albuterol (PROVENTIL HFA;VENTOLIN HFA) 108 (90 Base) MCG/ACT inhaler Inhale 2 puffs into the lungs every 6 (six) hours as needed for wheezing or shortness of breath. May sub proair if less expensive (Patient not taking: Reported on 11/08/2018)  . [DISCONTINUED] Calcium Carbonate (CALCIUM 600 PO) Take 1 tablet by mouth daily.  . [DISCONTINUED] HYDROcodone-homatropine (HYCODAN) 5-1.5 MG/5ML syrup Take 5 mLs by mouth every 8 (eight) hours as  needed for cough (at night).  . [DISCONTINUED] vitamin C (ASCORBIC ACID) 500 MG tablet Take 500 mg by mouth daily.   No facility-administered encounter medications on file as of 11/08/2018.     EXAM:  BP 126/70 (BP Location: Right Arm, Patient Position:  Sitting, Cuff Size: Normal)   Pulse 65   Temp 97.6 F (36.4 C) (Temporal)   Ht 5\' 2"  (1.575 m)   Wt 171 lb (77.6 kg)   LMP 04/19/1997   SpO2 98%   BMI 31.28 kg/m   Body mass index is 31.28 kg/m.  Physical Exam: Vital signs reviewed [id number] is a well-developed well-nourished alert cooperative   who appears stated age in no acute distress.  HEENT: normocephalic atraumatic , Eyes: PERRL EOM's full, conjunctiva clear, , Ears: no deformity EAC's clear TMs with normal landmarks. Mouth: masked NECK: supple without masses, thyromegaly or bruits. CHEST/PULM:  Clear to auscultation and percussion breath sounds equal no wheeze , rales or rhonchi. No chest wall deformities or tenderness. CV: PMI is nondisplaced, S1 S2 no gallops, murmurs, rubs. Peripheral pulses are full without delay.No JVD .    Abdomen:  Sof,t normal bowel sounds without hepatosplenomegaly, no guarding rebound or masses no CVA tenderness Extremtities:  No clubbing cyanosis or edema, no acute joint swelling or redness no focal atrophy NEURO:  Oriented x3, cranial nerves 3-12 appear to be intact, no obvious focal weakness,gait within normal limits no abnormal reflexes or asymmetrical SKIN: No acute rashes normal turgor, color, no bruising or petechiae. righg forearm shows  Small 3 mm silvery scaly patch ? ak  PSYCH: Oriented, good eye contact, no obvious depression anxiety, cognition and judgment appear normal. LN: no cervicaladenopathy No noted deficits in memory, attention, and speech.   Lab Results  Component Value Date   WBC 12.3 10/05/2018   HGB 13.7 10/05/2018   HCT 40 10/05/2018   PLT 389 10/05/2018   GLUCOSE 109 (H) 04/29/2017   CHOL 150 04/29/2017   TRIG 76.0  04/29/2017   HDL 60.30 04/29/2017   LDLCALC 75 04/29/2017   ALT 21 10/05/2018   AST 22 10/05/2018   NA 138 10/05/2018   K 3.6 10/05/2018   CL 101 04/29/2017   CREATININE 0.8 10/05/2018   BUN 19 10/05/2018   CO2 31 04/29/2017   TSH 1.12 11/13/2014   HGBA1C 5.5 11/20/2015   Rheum note reviewed  ASSESSMENT AND PLAN:  Discussed the following assessment and plan:  Medicare annual wellness visit, subsequent  Medication management - Plan: Hemoglobin 123456, Basic metabolic panel  Essential hypertension - controlled continue - Plan: Hemoglobin 123456, Basic metabolic panel  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HCC)  Need for immunization against influenza - Plan: Flu Vaccine QUAD High Dose(Fluad)  Elevated blood sugar level - was non fastin disc diet  and plan fasting lab and a1c  if all ok yearly check - Plan: Hemoglobin 123456, Basic metabolic panel   Patient Care Team: Panosh, Standley Brooking, MD as PCP - General (Internal Medicine) Arvella Nigh, MD as Consulting Physician (Obstetrics and Gynecology) Newman Pies, MD as Consulting Physician (Neurosurgery) Valinda Party, MD (Rheumatology) Sharyne Peach, MD as Consulting Physician (Ophthalmology)  Patient Instructions  Plan fasting lab  To check sugar levels   And make sure ok .  Check potassium foods in diet.   Glad you are doing well  Lets watch the skin lesion on arm may be an actinic keratosis.   Get shingrix immunizations at pharmacy( also TD booster if needed)   Dr Velda Shell for  mammo and dexa   Plan wellness  Med check in one year or as indication.     Health Maintenance, Female Adopting a healthy lifestyle and getting preventive care are important in promoting health and wellness. Ask your health care provider about:  The right schedule for you to have regular tests and exams.  Things you can do on your own to prevent diseases and keep yourself healthy. What should I know about diet, weight, and exercise? Eat a healthy diet    Eat a diet that includes plenty of vegetables, fruits, low-fat dairy products, and lean protein.  Do not eat a lot of foods that are high in solid fats, added sugars, or sodium. Maintain a healthy weight Body mass index (BMI) is used to identify weight problems. It estimates body fat based on height and weight. Your health care provider can help determine your BMI and help you achieve or maintain a healthy weight. Get regular exercise Get regular exercise. This is one of the most important things you can do for your health. Most adults should:  Exercise for at least 150 minutes each week. The exercise should increase your heart rate and make you sweat (moderate-intensity exercise).  Do strengthening exercises at least twice a week. This is in addition to the moderate-intensity exercise.  Spend less time sitting. Even light physical activity can be beneficial. Watch cholesterol and blood lipids Have your blood tested for lipids and cholesterol at 77 years of age, then have this test every 5 years. Have your cholesterol levels checked more often if:  Your lipid or cholesterol levels are high.  You are older than 77 years of age.  You are at high risk for heart disease. What should I know about cancer screening? Depending on your health history and family history, you may need to have cancer screening at various ages. This may include screening for:  Breast cancer.  Cervical cancer.  Colorectal cancer.  Skin cancer.  Lung cancer. What should I know about heart disease, diabetes, and high blood pressure? Blood pressure and heart disease  High blood pressure causes heart disease and increases the risk of stroke. This is more likely to develop in people who have high blood pressure readings, are of African descent, or are overweight.  Have your blood pressure checked: ? Every 3-5 years if you are 79-54 years of age. ? Every year if you are 58 years old or older. Diabetes Have  regular diabetes screenings. This checks your fasting blood sugar level. Have the screening done:  Once every three years after age 2 if you are at a normal weight and have a low risk for diabetes.  More often and at a younger age if you are overweight or have a high risk for diabetes. What should I know about preventing infection? Hepatitis B If you have a higher risk for hepatitis B, you should be screened for this virus. Talk with your health care provider to find out if you are at risk for hepatitis B infection. Hepatitis C Testing is recommended for:  Everyone born from [birth year] through 1964.  Anyone with known risk factors for hepatitis C. Sexually transmitted infections (STIs)  Get screened for STIs, including gonorrhea and chlamydia, if: ? You are sexually active and are younger than 77 years of age. ? You are older than 77 years of age and your health care provider tells you that you are at risk for this type of infection. ? Your sexual activity has changed since you were last screened, and you are at increased risk for chlamydia or gonorrhea. Ask your health care provider if you are at risk.  Ask your health care provider about whether you are at high risk for HIV. Your health care provider may  recommend a prescription medicine to help prevent HIV infection. If you choose to take medicine to prevent HIV, you should first get tested for HIV. You should then be tested every 3 months for as long as you are taking the medicine. Pregnancy  If you are about to stop having your period (premenopausal) and you may become pregnant, seek counseling before you get pregnant.  Take 400 to 800 micrograms (mcg) of folic acid every day if you become pregnant.  Ask for birth control (contraception) if you want to prevent pregnancy. Osteoporosis and menopause Osteoporosis is a disease in which the bones lose minerals and strength with aging. This can result in bone fractures. If you are 39 years  old or older, or if you are at risk for osteoporosis and fractures, ask your health care provider if you should:  Be screened for bone loss.  Take a calcium or vitamin D supplement to lower your risk of fractures.  Be given hormone replacement therapy (HRT) to treat symptoms of menopause. Follow these instructions at home: Lifestyle  Do not use any products that contain nicotine or tobacco, such as cigarettes, e-cigarettes, and chewing tobacco. If you need help quitting, ask your health care provider.  Do not use street drugs.  Do not share needles.  Ask your health care provider for help if you need support or information about quitting drugs. Alcohol use  Do not drink alcohol if: ? Your health care provider tells you not to drink. ? You are pregnant, may be pregnant, or are planning to become pregnant.  If you drink alcohol: ? Limit how much you use to 0-1 drink a day. ? Limit intake if you are breastfeeding.  Be aware of how much alcohol is in your drink. In the [place] one drink equals one 12 oz bottle of beer (355 mL), one 5 oz glass of wine (148 mL), or one 1 oz glass of hard liquor (44 mL). General instructions  Schedule regular health, dental, and eye exams.  Stay current with your vaccines.  Tell your health care provider if: ? You often feel depressed. ? You have ever been abused or do not feel safe at home. Summary  Adopting a healthy lifestyle and getting preventive care are important in promoting health and wellness.  Follow your health care provider's instructions about healthy diet, exercising, and getting tested or screened for diseases.  Follow your health care provider's instructions on monitoring your cholesterol and blood pressure. This information is not intended to replace advice given to you by your health care provider. Make sure you discuss any questions you have with your health care provider. Document Released: 08/04/2010 Document Revised:  01/12/2018 Document Reviewed: 01/12/2018 Elsevier Patient Education  2020 [place] Panosh M.D.

## 2018-11-08 ENCOUNTER — Ambulatory Visit (INDEPENDENT_AMBULATORY_CARE_PROVIDER_SITE_OTHER): Payer: Medicare Other | Admitting: Internal Medicine

## 2018-11-08 ENCOUNTER — Encounter: Payer: Self-pay | Admitting: Internal Medicine

## 2018-11-08 ENCOUNTER — Other Ambulatory Visit: Payer: Self-pay

## 2018-11-08 VITALS — BP 126/70 | HR 65 | Temp 97.6°F | Ht 62.0 in | Wt 171.0 lb

## 2018-11-08 DIAGNOSIS — Z0000 Encounter for general adult medical examination without abnormal findings: Secondary | ICD-10-CM

## 2018-11-08 DIAGNOSIS — R739 Hyperglycemia, unspecified: Secondary | ICD-10-CM

## 2018-11-08 DIAGNOSIS — I10 Essential (primary) hypertension: Secondary | ICD-10-CM | POA: Diagnosis not present

## 2018-11-08 DIAGNOSIS — L4050 Arthropathic psoriasis, unspecified: Secondary | ICD-10-CM | POA: Diagnosis not present

## 2018-11-08 DIAGNOSIS — Z79899 Other long term (current) drug therapy: Secondary | ICD-10-CM | POA: Diagnosis not present

## 2018-11-08 DIAGNOSIS — Z23 Encounter for immunization: Secondary | ICD-10-CM

## 2018-11-08 MED ORDER — CHLORTHALIDONE 25 MG PO TABS: 25.0000 mg | ORAL_TABLET | Freq: Every day | ORAL | 1 refills | Status: DC

## 2018-11-08 NOTE — Patient Instructions (Addendum)
Plan fasting lab  To check sugar levels   And make sure ok .  Check potassium foods in diet.   Glad you are doing well  Lets watch the skin lesion on arm may be an actinic keratosis.   Get shingrix immunizations at pharmacy( also TD booster if needed)   Dr Velda Shell for  mammo and dexa   Plan wellness  Med check in one year or as indication.     Health Maintenance, Female Adopting a healthy lifestyle and getting preventive care are important in promoting health and wellness. Ask your health care provider about:  The right schedule for you to have regular tests and exams.  Things you can do on your own to prevent diseases and keep yourself healthy. What should I know about diet, weight, and exercise? Eat a healthy diet   Eat a diet that includes plenty of vegetables, fruits, low-fat dairy products, and lean protein.  Do not eat a lot of foods that are high in solid fats, added sugars, or sodium. Maintain a healthy weight Body mass index (BMI) is used to identify weight problems. It estimates body fat based on height and weight. Your health care provider can help determine your BMI and help you achieve or maintain a healthy weight. Get regular exercise Get regular exercise. This is one of the most important things you can do for your health. Most adults should:  Exercise for at least 150 minutes each week. The exercise should increase your heart rate and make you sweat (moderate-intensity exercise).  Do strengthening exercises at least twice a week. This is in addition to the moderate-intensity exercise.  Spend less time sitting. Even light physical activity can be beneficial. Watch cholesterol and blood lipids Have your blood tested for lipids and cholesterol at 77 years of age, then have this test every 5 years. Have your cholesterol levels checked more often if:  Your lipid or cholesterol levels are high.  You are older than 77 years of age.  You are at high risk for heart  disease. What should I know about cancer screening? Depending on your health history and family history, you may need to have cancer screening at various ages. This may include screening for:  Breast cancer.  Cervical cancer.  Colorectal cancer.  Skin cancer.  Lung cancer. What should I know about heart disease, diabetes, and high blood pressure? Blood pressure and heart disease  High blood pressure causes heart disease and increases the risk of stroke. This is more likely to develop in people who have high blood pressure readings, are of African descent, or are overweight.  Have your blood pressure checked: ? Every 3-5 years if you are 21-55 years of age. ? Every year if you are 23 years old or older. Diabetes Have regular diabetes screenings. This checks your fasting blood sugar level. Have the screening done:  Once every three years after age 32 if you are at a normal weight and have a low risk for diabetes.  More often and at a younger age if you are overweight or have a high risk for diabetes. What should I know about preventing infection? Hepatitis B If you have a higher risk for hepatitis B, you should be screened for this virus. Talk with your health care provider to find out if you are at risk for hepatitis B infection. Hepatitis C Testing is recommended for:  Everyone born from [birth year] through 1964.  Anyone with known risk factors for hepatitis C. Sexually  transmitted infections (STIs)  Get screened for STIs, including gonorrhea and chlamydia, if: ? You are sexually active and are younger than 77 years of age. ? You are older than 77 years of age and your health care provider tells you that you are at risk for this type of infection. ? Your sexual activity has changed since you were last screened, and you are at increased risk for chlamydia or gonorrhea. Ask your health care provider if you are at risk.  Ask your health care provider about whether you are at high  risk for HIV. Your health care provider may recommend a prescription medicine to help prevent HIV infection. If you choose to take medicine to prevent HIV, you should first get tested for HIV. You should then be tested every 3 months for as long as you are taking the medicine. Pregnancy  If you are about to stop having your period (premenopausal) and you may become pregnant, seek counseling before you get pregnant.  Take 400 to 800 micrograms (mcg) of folic acid every day if you become pregnant.  Ask for birth control (contraception) if you want to prevent pregnancy. Osteoporosis and menopause Osteoporosis is a disease in which the bones lose minerals and strength with aging. This can result in bone fractures. If you are 35 years old or older, or if you are at risk for osteoporosis and fractures, ask your health care provider if you should:  Be screened for bone loss.  Take a calcium or vitamin D supplement to lower your risk of fractures.  Be given hormone replacement therapy (HRT) to treat symptoms of menopause. Follow these instructions at home: Lifestyle  Do not use any products that contain nicotine or tobacco, such as cigarettes, e-cigarettes, and chewing tobacco. If you need help quitting, ask your health care provider.  Do not use street drugs.  Do not share needles.  Ask your health care provider for help if you need support or information about quitting drugs. Alcohol use  Do not drink alcohol if: ? Your health care provider tells you not to drink. ? You are pregnant, may be pregnant, or are planning to become pregnant.  If you drink alcohol: ? Limit how much you use to 0-1 drink a day. ? Limit intake if you are breastfeeding.  Be aware of how much alcohol is in your drink. In the [place] one drink equals one 12 oz bottle of beer (355 mL), one 5 oz glass of wine (148 mL), or one 1 oz glass of hard liquor (44 mL). General instructions  Schedule regular health, dental,  and eye exams.  Stay current with your vaccines.  Tell your health care provider if: ? You often feel depressed. ? You have ever been abused or do not feel safe at home. Summary  Adopting a healthy lifestyle and getting preventive care are important in promoting health and wellness.  Follow your health care provider's instructions about healthy diet, exercising, and getting tested or screened for diseases.  Follow your health care provider's instructions on monitoring your cholesterol and blood pressure. This information is not intended to replace advice given to you by your health care provider. Make sure you discuss any questions you have with your health care provider. Document Released: 08/04/2010 Document Revised: 01/12/2018 Document Reviewed: 01/12/2018 Elsevier Patient Education  2020 [employer].

## 2018-11-15 ENCOUNTER — Other Ambulatory Visit: Payer: Self-pay

## 2018-11-15 ENCOUNTER — Other Ambulatory Visit (INDEPENDENT_AMBULATORY_CARE_PROVIDER_SITE_OTHER): Payer: Medicare Other

## 2018-11-15 DIAGNOSIS — I10 Essential (primary) hypertension: Secondary | ICD-10-CM | POA: Diagnosis not present

## 2018-11-15 DIAGNOSIS — R739 Hyperglycemia, unspecified: Secondary | ICD-10-CM

## 2018-11-15 DIAGNOSIS — Z79899 Other long term (current) drug therapy: Secondary | ICD-10-CM | POA: Diagnosis not present

## 2018-11-15 LAB — BASIC METABOLIC PANEL
BUN: 13 mg/dL (ref 6–23)
CO2: 32 mEq/L (ref 19–32)
Calcium: 9.8 mg/dL (ref 8.4–10.5)
Chloride: 100 mEq/L (ref 96–112)
Creatinine, Ser: 0.63 mg/dL (ref 0.40–1.20)
GFR: 91.5 mL/min (ref >60.00)
Glucose, Bld: 95 mg/dL (ref 70–99)
Potassium: 4.2 mEq/L (ref 3.5–5.1)
Sodium: 139 mEq/L (ref 135–145)

## 2018-11-15 LAB — HEMOGLOBIN A1C: Hgb A1c MFr Bld: 5.6 % (ref 4.6–6.5)

## 2018-11-22 ENCOUNTER — Other Ambulatory Visit: Payer: Self-pay | Admitting: Internal Medicine

## 2018-11-23 DIAGNOSIS — Z6832 Body mass index (BMI) 32.0-32.9, adult: Secondary | ICD-10-CM | POA: Diagnosis not present

## 2018-11-23 DIAGNOSIS — N3945 Continuous leakage: Secondary | ICD-10-CM | POA: Diagnosis not present

## 2018-11-23 DIAGNOSIS — N3941 Urge incontinence: Secondary | ICD-10-CM | POA: Diagnosis not present

## 2018-11-23 DIAGNOSIS — Z01419 Encounter for gynecological examination (general) (routine) without abnormal findings: Secondary | ICD-10-CM | POA: Diagnosis not present

## 2018-11-29 DIAGNOSIS — L989 Disorder of the skin and subcutaneous tissue, unspecified: Secondary | ICD-10-CM | POA: Diagnosis not present

## 2018-11-29 DIAGNOSIS — L570 Actinic keratosis: Secondary | ICD-10-CM | POA: Diagnosis not present

## 2018-12-07 IMAGING — US US THYROID BIOPSY
1 series · 13 of 14 positions shown · non-contrast
Comparison: Ultrasound done 05/25/2016

MEDICATIONS:
1% Lidocaine = 3 mL

COMPLICATIONS:
None immediate.

INDICATION: Indeterminate left thyroid nodule

EXAM:
ULTRASOUND GUIDED FINE NEEDLE ASPIRATION OF INDETERMINATE LEFT
THYROID NODULE
TECHNIQUE: Informed written consent was obtained from the patient after a
discussion of the risks, benefits and alternatives to treatment.
Questions regarding the procedure were encouraged and answered. A
timeout was performed prior to the initiation of the procedure.

[Series 1: us thyroid biopsy · 0.06mm/px · 14 acquisitions, 13 frames shown]
[im 1/14]
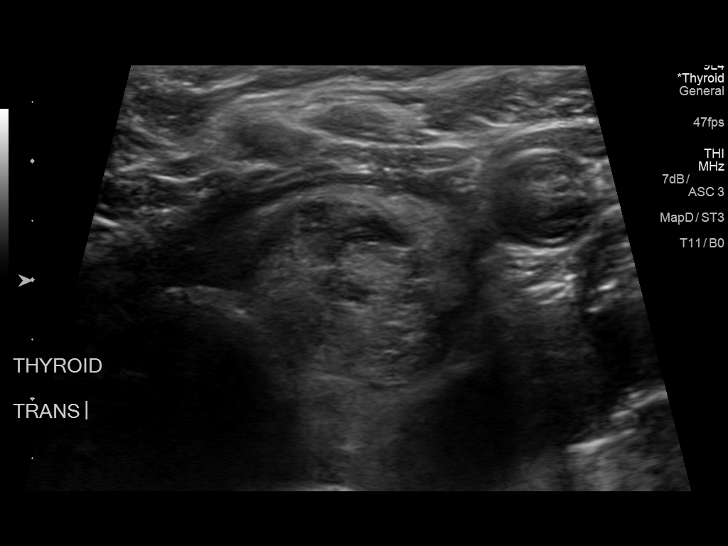
[im 2/14]
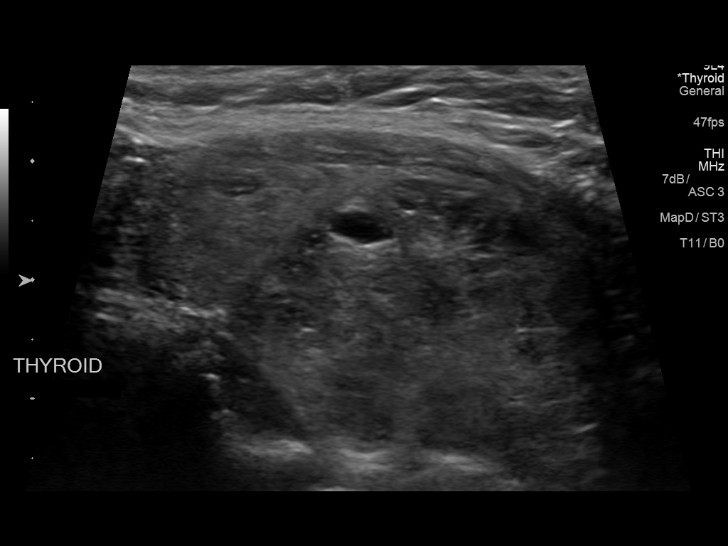
[im 3/14]
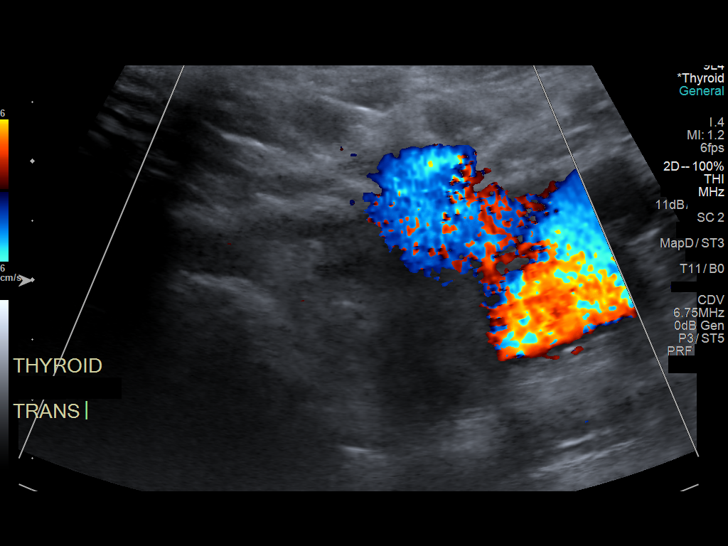
[im 4/14]
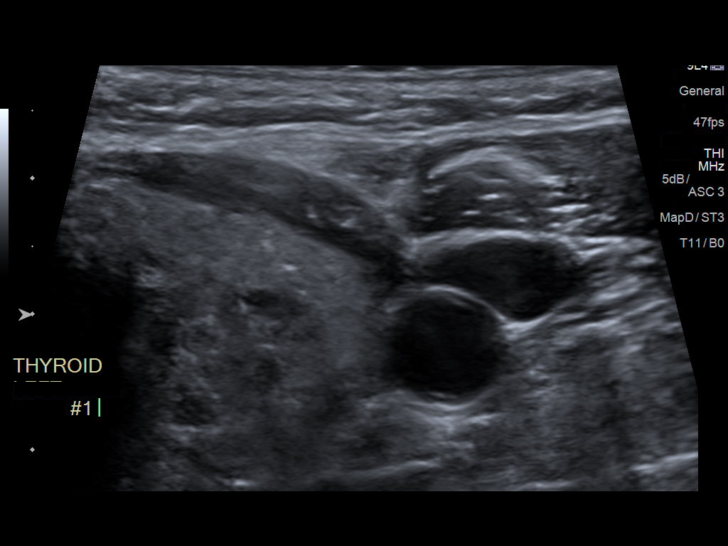
[im 5/14]
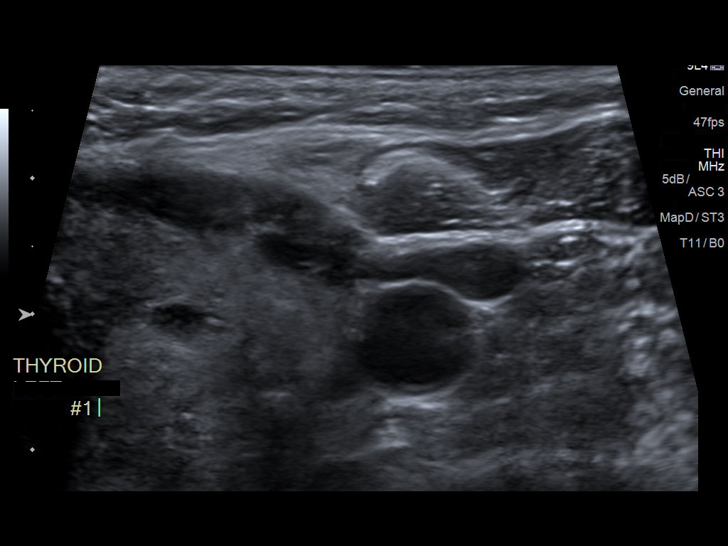
[im 6/14]
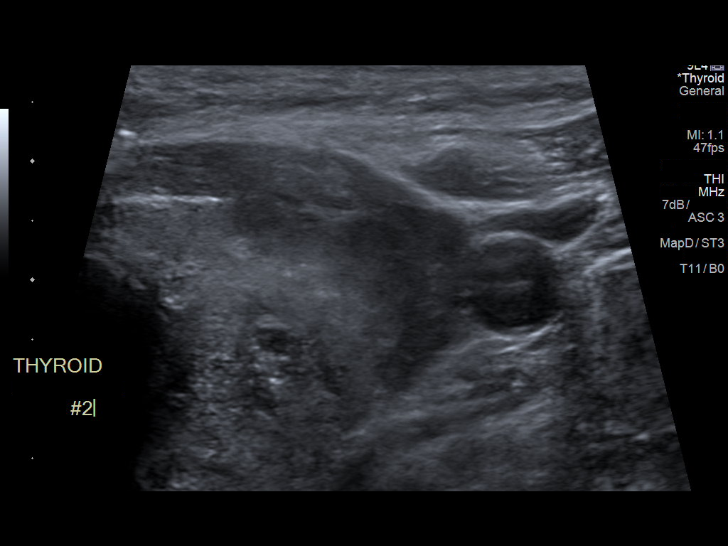
[im 8/14]
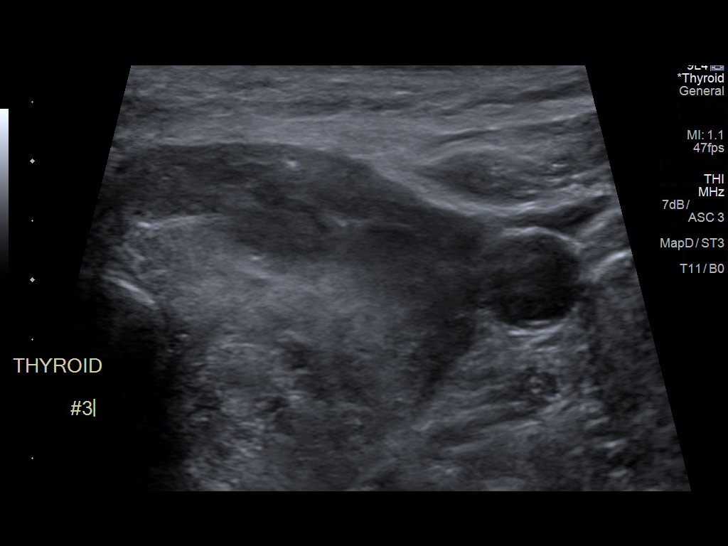
[im 9/14]
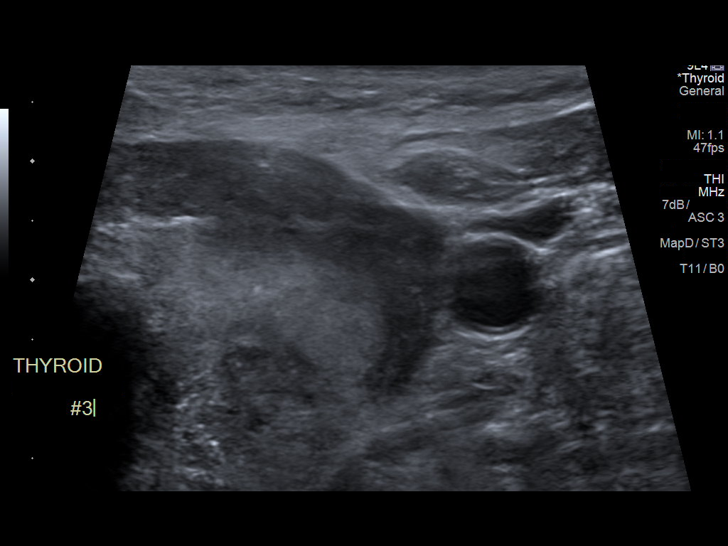
[im 10/14]
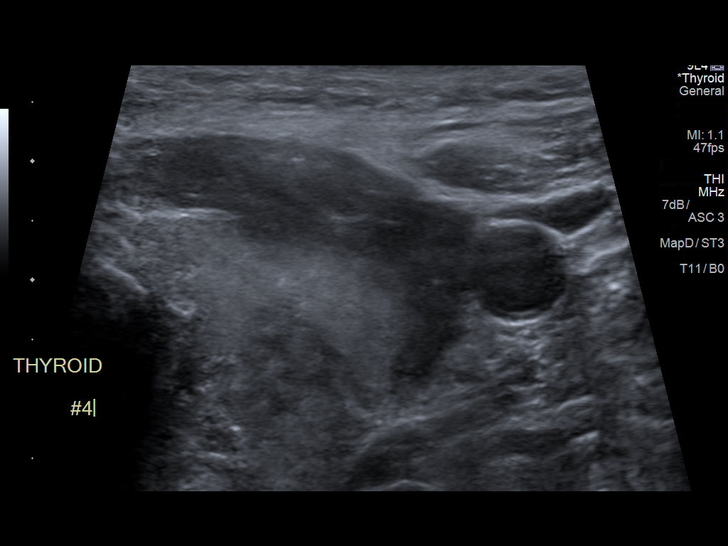
[im 11/14]
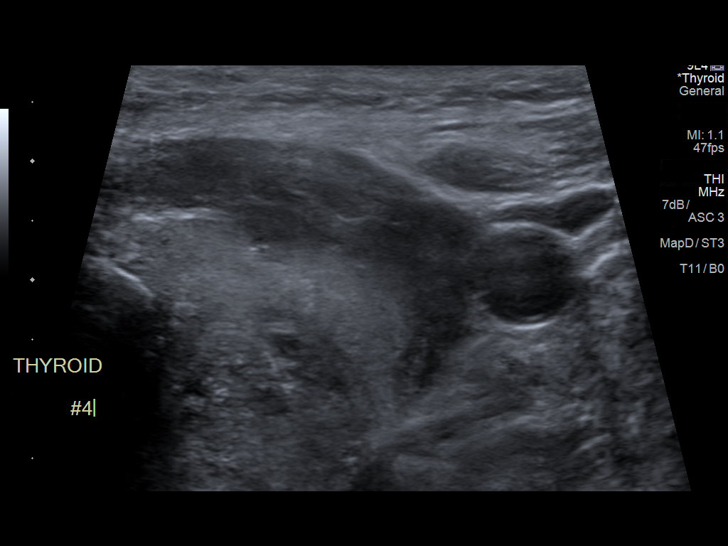
[im 12/14]
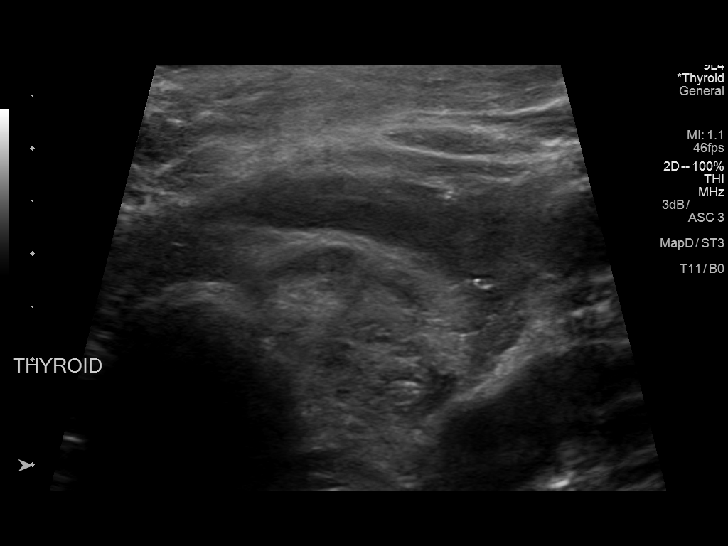
[im 13/14]
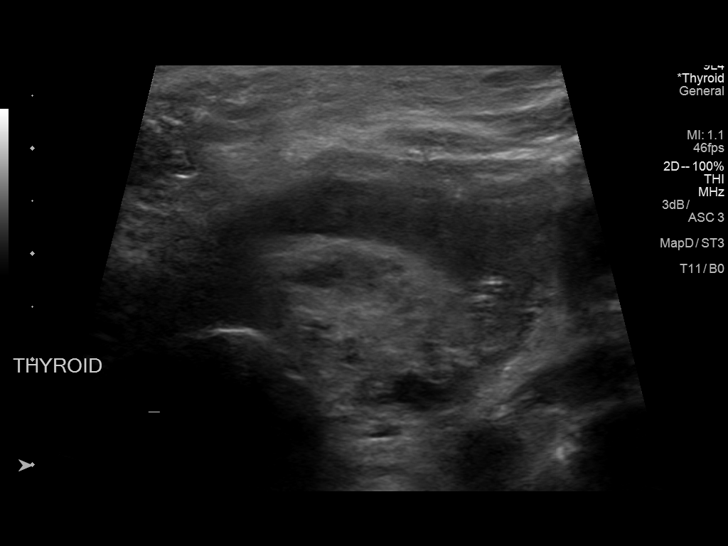
[im 14/14]
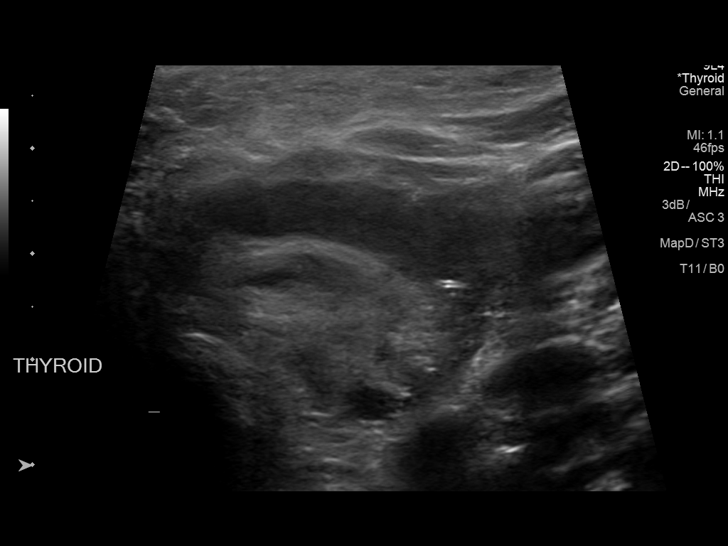

[13 of 14 positions shown; findings below may reference images not displayed]

Pre-procedural ultrasound scanning demonstrated unchanged size and
appearance of the indeterminate nodule within the left thyroid.

The procedure was planned. The neck was prepped in the usual sterile
fashion, and a sterile drape was applied covering the operative
field. A timeout was performed prior to the initiation of the
procedure. Local anesthesia was provided with 1% lidocaine.

Under direct ultrasound guidance, 4 FNA biopsies were performed of
the left thyroid nodule with a 25 gauge needle. Multiple ultrasound
images were saved for procedural documentation purposes. The samples
were prepared and submitted to pathology.

Limited post procedural scanning was negative for hematoma or
additional complication. Dressings were placed. The patient
tolerated the above procedures procedure well without immediate
postprocedural complication.
FINDINGS: FINDINGS
Nodule reference number based on prior diagnostic ultrasound: 2

Maximum size:  2.7 cm

Location: Left; Inferior

ACR TI-RADS risk category: TR4 (4-6 points)

Reason for biopsy: meets ACR TI-RADS criteria

Ultrasound imaging confirms appropriate placement of the needles
within the thyroid nodule.
IMPRESSION: Technically successful ultrasound guided fine needle aspiration of
left thyroid nodule.

## 2019-01-04 DIAGNOSIS — L4050 Arthropathic psoriasis, unspecified: Secondary | ICD-10-CM | POA: Diagnosis not present

## 2019-01-04 DIAGNOSIS — M1990 Unspecified osteoarthritis, unspecified site: Secondary | ICD-10-CM | POA: Diagnosis not present

## 2019-01-10 DIAGNOSIS — M48061 Spinal stenosis, lumbar region without neurogenic claudication: Secondary | ICD-10-CM | POA: Diagnosis not present

## 2019-01-10 DIAGNOSIS — I10 Essential (primary) hypertension: Secondary | ICD-10-CM | POA: Diagnosis not present

## 2019-01-10 DIAGNOSIS — M5136 Other intervertebral disc degeneration, lumbar region: Secondary | ICD-10-CM | POA: Diagnosis not present

## 2019-01-10 DIAGNOSIS — Z6831 Body mass index (BMI) 31.0-31.9, adult: Secondary | ICD-10-CM | POA: Diagnosis not present

## 2019-02-06 ENCOUNTER — Ambulatory Visit: Payer: Self-pay

## 2019-02-06 NOTE — Telephone Encounter (Signed)
Patient called stating that she has a UTI.  She states she is voiding frequently and has pressure and burning.  She states she is drinking lots of water to flush her bladder.  She states the symptoms started Saturday.  She has had UTI Hx but she states that it has been 20 years ago.  She rates the pain at 6-7 when voiding.  She denies fever and flank pain. Care advice read to patient. Patient would like call back in AM for possible appointment.  She is unsure about need for UC tonight. Note will be routed to office.  Reason for Disposition . Urinating more frequently than usual (i.e., frequency)  Answer Assessment - Initial Assessment Questions 1. SYMPTOM: "What's the main symptom you're concerned about?" (e.g., frequency, incontinence)     Frequency pressure burning 2. ONSET: "When did the symptoms  start?"    Saturday 3. PAIN: "Is there any pain?" If so, ask: "How bad is it?" (Scale: 1-10; mild, moderate, severe)    6-7 4. CAUSE: "What do you think is causing the symptoms?"     UTI 5. OTHER SYMPTOMS: "Do you have any other symptoms?" (e.g., fever, flank pain, blood in urine, pain with urination)    No pain with urination 6. PREGNANCY: "Is there any chance you are pregnant?" "When was your last menstrual period?"    N/A  Protocols used: URINARY [facility]

## 2019-02-07 ENCOUNTER — Encounter: Payer: Self-pay | Admitting: Family Medicine

## 2019-02-07 ENCOUNTER — Ambulatory Visit (INDEPENDENT_AMBULATORY_CARE_PROVIDER_SITE_OTHER): Payer: Medicare Other | Admitting: Family Medicine

## 2019-02-07 ENCOUNTER — Other Ambulatory Visit: Payer: Self-pay

## 2019-02-07 VITALS — BP 128/86 | HR 68 | Temp 98.0°F | Ht 62.0 in | Wt 176.8 lb

## 2019-02-07 DIAGNOSIS — R350 Frequency of micturition: Secondary | ICD-10-CM | POA: Diagnosis not present

## 2019-02-07 LAB — POCT URINALYSIS DIPSTICK
Bilirubin, UA: NEGATIVE
Blood, UA: POSITIVE
Glucose, UA: NEGATIVE
Ketones, UA: NEGATIVE
Leukocytes, UA: NEGATIVE
Nitrite, UA: NEGATIVE
Protein, UA: NEGATIVE
Spec Grav, UA: 1.01 (ref 1.010–1.025)
Urobilinogen, UA: 0.2 E.U./dL
pH, UA: 6 (ref 5.0–8.0)

## 2019-02-07 MED ORDER — CEPHALEXIN 500 MG PO CAPS: 500.0000 mg | ORAL_CAPSULE | Freq: Three times a day (TID) | ORAL | 0 refills | Status: DC

## 2019-02-07 NOTE — Progress Notes (Signed)
  Subjective:     Patient ID: Michelle [name], female   DOB: April 19, 1940, 79 y.o.   MRN: [id number]  HPI Michelle [name] is seen with onset Saturday of urine frequency and burning with urination.  No gross hematuria.  No flank pain.  No nausea or vomiting.  No fevers or chills.  Symptoms are slightly better today.  She states she is not had a UTI for about 20 years.  She had cystocele repair about that time but has done extremely well since then.  Past Medical History:  Diagnosis Date  . 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 Hypertension   . Osteopenia    Dr.John McComb   Past Surgical History:  Procedure Laterality Date  . BACK SURGERY  09/2010;02/2012   DISC REMOVED AND SPACER/FUSION PLACED   . CATARACT EXTRACTION, BILATERAL    . COLONOSCOPY     negative  . TOTAL ABDOMINAL HYSTERECTOMY W/ BILATERAL SALPINGOOPHORECTOMY     For Fibroids  . TUBAL LIGATION      reports that she has never smoked. She has never used smokeless tobacco. She reports that she does not drink alcohol or use drugs. family history includes Atrial fibrillation (age of onset: 75) in her daughter; Autoimmune disease in her mother; Breast cancer in her maternal aunt; Cancer in her father; Hypertension in her maternal aunt and mother; Kidney cancer in her father; Osteoporosis in an other family member; Stroke (age of onset: 56) in her maternal grandfather. No Known Allergies   Review of Systems  Constitutional: Negative for chills and fever.  Gastrointestinal: Negative for abdominal pain, nausea and vomiting.  Genitourinary: Positive for dysuria and frequency. Negative for flank pain and hematuria.       Objective:   Physical Exam Vitals reviewed.  Constitutional:      Appearance: Normal appearance.  Cardiovascular:     Rate and Rhythm: Normal rate and regular rhythm.  Pulmonary:     Effort: Pulmonary effort is normal.     Breath sounds: Normal breath sounds.  Neurological:     Mental Status: She is alert.         Assessment:     Dysuria.  Urine dipstick shows only trace blood but no leukocytes.  However, her symptoms are fairly classic for possible cystitis.  She does not complain of any chronic atrophic vaginitis type symptoms    Plan:     -Urine culture sent -Stay well-hydrated -Cover with Keflex 500 mg 3 times daily for 5 days pending culture results -Follow-up for any persistent or worsening symptoms  Eulas Post MD [facility]

## 2019-02-07 NOTE — Telephone Encounter (Signed)
Pt has been called and made appt

## 2019-02-07 NOTE — Patient Instructions (Signed)

## 2019-02-08 LAB — URINE CULTURE
MICRO NUMBER:: 10008477
Result:: NO GROWTH
SPECIMEN QUALITY:: ADEQUATE

## 2019-02-19 ENCOUNTER — Other Ambulatory Visit: Payer: Self-pay | Admitting: Internal Medicine

## 2019-02-23 ENCOUNTER — Ambulatory Visit: Payer: Medicare Other | Attending: Internal Medicine

## 2019-02-23 DIAGNOSIS — Z23 Encounter for immunization: Secondary | ICD-10-CM

## 2019-02-23 NOTE — Progress Notes (Signed)
   Covid-19 Vaccination Clinic  Name:  Michelle [name]    MRN: [id number] DOB: 11-03-1940  02/23/2019  [name] was observed post Covid-19 immunization for 15 minutes without incidence. She was provided with Vaccine Information Sheet and instruction to access the V-Safe system.   [name] was instructed to call 911 with any severe reactions post vaccine: Marland Kitchen Difficulty breathing  . Swelling of your face and throat  . A fast heartbeat  . A bad rash all over your body  . Dizziness and weakness    Immunizations Administered    Name Date Dose VIS Date Route   Pfizer COVID-19 Vaccine 02/23/2019  8:35 AM 0.3 mL 01/13/2019 Intramuscular   Manufacturer: [place]   Lot: [id number]   [place]: [id number]

## 2019-02-26 ENCOUNTER — Other Ambulatory Visit: Payer: Self-pay | Admitting: Internal Medicine

## 2019-03-16 ENCOUNTER — Ambulatory Visit: Payer: Medicare Other | Attending: Internal Medicine

## 2019-03-16 DIAGNOSIS — Z23 Encounter for immunization: Secondary | ICD-10-CM | POA: Insufficient documentation

## 2019-03-16 NOTE — Progress Notes (Signed)
   Covid-19 Vaccination Clinic  Name:  Michelle [name]    MRN: [id number] DOB: 1940/05/01  03/16/2019  [name] was observed post Covid-19 immunization for 15 minutes without incidence. She was provided with Vaccine Information Sheet and instruction to access the V-Safe system.   [name] was instructed to call 911 with any severe reactions post vaccine: Marland Kitchen Difficulty breathing  . Swelling of your face and throat  . A fast heartbeat  . A bad rash all over your body  . Dizziness and weakness    Immunizations Administered    Name Date Dose VIS Date Route   Pfizer COVID-19 Vaccine 03/16/2019  8:06 AM 0.3 mL 01/13/2019 Intramuscular   Manufacturer: [place]   Lot: [id number]   [place]: [id number]

## 2019-04-04 DIAGNOSIS — M5136 Other intervertebral disc degeneration, lumbar region: Secondary | ICD-10-CM | POA: Diagnosis not present

## 2019-04-04 DIAGNOSIS — Z79899 Other long term (current) drug therapy: Secondary | ICD-10-CM | POA: Diagnosis not present

## 2019-04-04 DIAGNOSIS — L4050 Arthropathic psoriasis, unspecified: Secondary | ICD-10-CM | POA: Diagnosis not present

## 2019-04-04 DIAGNOSIS — M1990 Unspecified osteoarthritis, unspecified site: Secondary | ICD-10-CM | POA: Diagnosis not present

## 2019-05-04 DIAGNOSIS — L578 Other skin changes due to chronic exposure to nonionizing radiation: Secondary | ICD-10-CM | POA: Diagnosis not present

## 2019-05-04 DIAGNOSIS — L304 Erythema intertrigo: Secondary | ICD-10-CM | POA: Diagnosis not present

## 2019-05-04 DIAGNOSIS — L570 Actinic keratosis: Secondary | ICD-10-CM | POA: Diagnosis not present

## 2019-05-04 DIAGNOSIS — D225 Melanocytic nevi of trunk: Secondary | ICD-10-CM | POA: Diagnosis not present

## 2019-05-04 DIAGNOSIS — D2271 Melanocytic nevi of right lower limb, including hip: Secondary | ICD-10-CM | POA: Diagnosis not present

## 2019-05-04 DIAGNOSIS — D485 Neoplasm of uncertain behavior of skin: Secondary | ICD-10-CM | POA: Diagnosis not present

## 2019-05-04 DIAGNOSIS — L821 Other seborrheic keratosis: Secondary | ICD-10-CM | POA: Diagnosis not present

## 2019-05-04 DIAGNOSIS — L814 Other melanin hyperpigmentation: Secondary | ICD-10-CM | POA: Diagnosis not present

## 2019-05-04 DIAGNOSIS — D1801 Hemangioma of skin and subcutaneous tissue: Secondary | ICD-10-CM | POA: Diagnosis not present

## 2019-05-16 DIAGNOSIS — Z1231 Encounter for screening mammogram for malignant neoplasm of breast: Secondary | ICD-10-CM | POA: Diagnosis not present

## 2019-05-29 ENCOUNTER — Other Ambulatory Visit: Payer: Self-pay | Admitting: Internal Medicine

## 2019-06-26 ENCOUNTER — Other Ambulatory Visit: Payer: Self-pay

## 2019-06-26 NOTE — Progress Notes (Signed)
Chief Complaint  Patient presents with  . Foot Pain    right heel pain, hurting for about 6 weeks, Ibuprofen does help the pain    HPI: Michelle [name] 79 y.o. come in for 6 weeks of right heel pain  No injury but had been walking in shoes that didn't  fit wright and  happened after this . No pop   Pain and inc with walking  Using ice and ibuprofen temp help but not better and poss worse  Keeps her from walking  Usually does lots of walking  Has dec since  Lives alone and needs to be able  to drive  Remote hx of plantar fasciitis  rx with [place] and no recurrence after orthotics .  ROS: See pertinent positives and negatives per HPI. Hx psoraitic arthritis Past Medical History:  Diagnosis Date  . 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 Hypertension   . Osteopenia    Dr.John McComb    Family History  Problem Relation Age of Onset  . Kidney cancer Father        prostate   . Cancer Father   . Hypertension Mother        died at age 13   . Autoimmune disease Mother        Hemolytic Anemia  . Breast cancer Maternal Aunt   . Hypertension Maternal Aunt   . Stroke Maternal Grandfather 10  . Osteoporosis Other        Mother, 2 M aunts , MGM  . Atrial fibrillation Daughter 18  . Diabetes Neg Hx   . Heart disease Neg Hx     Social History   Socioeconomic History  . Marital status: Widowed    Spouse name: Not on file  . Number of children: Not on file  . Years of education: Not on file  . Highest education level: Not on file  Occupational History  . Occupation: Retired  Tobacco Use  . Smoking status: Never Smoker  . Smokeless tobacco: Never Used  Substance and Sexual Activity  . Alcohol use: No  . Drug use: No  . Sexual activity: Not on file  Other Topics Concern  . Not on file  Social History Narrative   9 hours of sleep per night   Single living in the home (widow)   No pets   Retired   Children 3  . 2 local 1 in [place] and  then  [place] back to [employer] with exercise.   Childbirth x3   Social Determinants of Health   Financial Resource Strain:   . Difficulty of Paying Living Expenses:   Food Insecurity:   . Worried About Charity fundraiser in the Last Year:   . Arboriculturist in the Last Year:   Transportation Needs:   . Film/video editor (Medical):   Marland Kitchen Lack of Transportation (Non-Medical):   Physical Activity:   . Days of Exercise per Week:   . Minutes of Exercise per Session:   Stress:   . Feeling of Stress :   Social Connections:   . Frequency of Communication with Friends and Family:   . Frequency of Social Gatherings with Friends and Family:   . Attends Religious Services:   . Active Member of Clubs or Organizations:   . Attends Archivist Meetings:   Marland Kitchen Marital Status:     Outpatient Medications  Prior to Visit  Medication Sig Dispense Refill  . chlorthalidone (HYGROTON) 25 MG tablet TAKE 1/2-1 TABLET BY MOUTH EVERY DAY AS DIRECTED 90 tablet 1  . Cholecalciferol (VITAMIN D) 2000 UNITS CAPS Take 1 capsule by mouth daily.     . folic acid (FOLVITE) 1 MG tablet Take 1 mg by mouth daily.      Marland Kitchen losartan (COZAAR) 100 MG tablet TAKE 1 TABLET BY MOUTH EVERY DAY 90 tablet 0  . methotrexate (RHEUMATREX) 2.5 MG tablet Take 12.5 mg by mouth once a week. Caution:Chemotherapy. Protect from light. TAKES ON SAT    . metoprolol succinate (TOPROL-XL) 50 MG 24 hr tablet TAKE 1 TABLET BY MOUTH EVERY DAY 90 tablet 0  . Multiple Vitamin (MULTIVITAMIN) tablet Take 1 tablet by mouth daily.    . Multiple Vitamins-Minerals (PRESERVISION AREDS) TABS Take 1 tablet by mouth 2 (two) times daily.    . cephALEXin (KEFLEX) 500 MG capsule Take 1 capsule (500 mg total) by mouth 3 (three) times daily. (Patient not taking: Reported on 06/27/2019) 15 capsule 0   No facility-administered medications prior to visit.     EXAM:  BP 124/78   Pulse 77   Temp 98.4 F (36.9 C) (Temporal)   Ht 5\' 2"  (1.575 m)    Wt 173 lb 6.4 oz (78.7 kg)   LMP 04/19/1997   SpO2 97%   BMI 31.72 kg/m   Body mass index is 31.72 kg/m.  GENERAL: vitals reviewed and listed above, alert, oriented, appears well hydrated and in no acute distress HEENT: atraumatic, conjunctiva  clear, no obvious abnormalities on inspection of external nose and ears OP masked NECK: no obvious masses on inspection palpation  LUNGS: clear to auscultation bilaterally, no wheezes, rales or rhonchi, good air movement CV: HRRR, no clubbing cyanosis or  peripheral edema nl cap refill  MS: moves all extremities right heel small blister ( new)  No weeping.  thickened  Achilles  Tender no red or warmth   rom nl some diriving  at gastric attachment but intact  rom nl  No crepitus  Sole nl and nv ok  PSYCH: pleasant and cooperative, no obvious depression or anxiety  BP Readings from Last 3 Encounters:  06/27/19 124/78  02/07/19 128/86  11/08/18 126/70    ASSESSMENT AND PLAN:  Discussed the following assessment and plan:  Achilles tendon pain - with palpable thickening and deformity  - Plan: Ambulatory referral to Sports Medicine  Pain of right heel 6 weeks  Continuing  Needs more eval poss [place] dx and fu  Dec activity until  Evaluated  Can use local topicals   In the interim   -Patient advised to return or notify health care team  if  new concerns arise.  Patient Instructions  This acts like achilles tendinitis and poss  Partial tear  But not sure  Will be notified about  Sports  medicine  For evaluation.     Rosen's Emergency Medicine: Concepts and Clinical Practice (9th ed., pp. [id number]). [place], [place]: [place]. Retrieved from https://www.clinicalkey.com/#!/content/book/3-[email or web address]?[email or web address]">  Achilles Tendinitis  Achilles tendinitis is inflammation of the tough, cord-like band that attaches the lower leg muscles to the heel bone (Achilles tendon). This is usually caused by overusing the tendon  and the ankle joint. Achilles tendinitis usually gets better over time with treatment and caring for yourself at home. It can take weeks or months to heal completely. What are the causes? This condition may be caused by:  A sudden increase  in exercise or activity, such as running.  Doing the same exercises or activities, such as jumping, over and over.  Not warming up calf muscles before exercising.  Exercising in shoes that are worn out or not made for exercise.  Having arthritis or a bone growth (spur) on the back of the heel bone. This can rub against the tendon and hurt it.  Age-related wear and tear. Tendons become less flexible with age and are more likely to be injured. What are the signs or symptoms? Common symptoms of this condition include:  Pain in the Achilles tendon or in the back of the leg, just above the heel. The pain usually gets worse with exercise.  Stiffness or soreness in the back of the leg, especially in the morning.  Swelling of the skin over the Achilles tendon.  Thickening of the tendon.  Trouble standing on tiptoe. How is this diagnosed? This condition is diagnosed based on your symptoms and a physical exam. You may have tests, including:  X-rays.  MRI. How is this treated? The goal of treatment is to relieve symptoms and help your injury heal. Treatment may include:  Decreasing or stopping activities that caused the tendinitis. This may mean switching to low-impact exercises like biking or swimming.  Icing the injured area.  Doing physical therapy, including strengthening and stretching exercises.  Taking NSAIDs, such as ibuprofen, to help relieve pain and swelling.  Using supportive shoes, wraps, heel lifts, or a walking boot (air cast).  Having surgery. This may be done if your symptoms do not improve after other treatments.  Using high-energy shock wave impulses to stimulate the healing process (extracorporeal shock wave therapy). This  is rare.  Having an injection of medicines that help relieve inflammation (corticosteroids). This is rare. Follow these instructions at home: If you have an air cast:  Wear the air cast as told by your health care provider. Remove it only as told by your health care provider.  Loosen it if your toes tingle, become numb, or turn cold and blue.  Keep it clean.  If the air cast is not waterproof: ? Do not let it get wet. ? Cover it with a watertight covering when you take a bath or shower. Managing pain, stiffness, and swelling   If directed, put ice on the injured area. To do this: ? If you have a removable air cast, remove it as told by your health care provider. ? Put ice in a plastic bag. ? Place a towel between your skin and the bag. ? Leave the ice on for 20 minutes, 2-3 times a day.  Move your toes often to reduce stiffness and swelling.  Raise (elevate) your foot above the level of your heart while you are sitting or lying down. Activity  Gradually return to your normal activities as told by your health care provider. Ask your health care provider what activities are safe for you.  Do not do activities that cause pain.  Consider doing low-impact exercises, like cycling or swimming.  Ask your health care provider when it is safe to drive if you have an air cast on your foot.  If physical therapy was prescribed, do exercises as told by your health care provider or physical therapist. General instructions  If directed, wrap your foot with an elastic bandage or other wrap. This can help to keep your tendon from moving too much while it heals. Your health care provider will show you how to wrap your foot  correctly.  Wear supportive shoes or heel lifts only as told by your health care provider.  Take over-the-counter and prescription medicines only as told by your health care provider.  Keep all follow-up visits as told by your health care provider. This is  important. Contact a health care provider if you:  Have symptoms that get worse.  Have pain that does not get better with medicine.  Develop new, unexplained symptoms.  Develop warmth and swelling in your foot.  Have a fever. Get help right away if you:  Have a sudden popping sound or sensation in your Achilles tendon followed by severe pain.  Cannot move your toes or foot.  Cannot put any weight on your foot.  Your foot or toes become numb and look white or blue even after loosening your bandage or air cast. Summary  Achilles tendinitis is inflammation of the tough, cord-like band that attaches the lower leg muscles to the heel bone (Achilles tendon).  This condition is usually caused by overusing the tendon and the ankle joint. It can also be caused by arthritis or normal aging.  The most common symptoms of this condition include pain, swelling, or stiffness in the Achilles tendon or in the back of the leg.  This condition is usually treated by decreasing or stopping activities that caused the tendinitis, icing the injured area, taking NSAIDs, and doing physical therapy. This information is not intended to replace advice given to you by your health care provider. Make sure you discuss any questions you have with your health care provider. Document Revised: 06/06/2018 Document Reviewed: 06/06/2018 Elsevier Patient Education  2020 [place] Jazman Reuter M.D.

## 2019-06-27 ENCOUNTER — Encounter: Payer: Self-pay | Admitting: Internal Medicine

## 2019-06-27 ENCOUNTER — Ambulatory Visit (INDEPENDENT_AMBULATORY_CARE_PROVIDER_SITE_OTHER): Payer: Medicare Other | Admitting: Internal Medicine

## 2019-06-27 VITALS — BP 124/78 | HR 77 | Temp 98.4°F | Ht 62.0 in | Wt 173.4 lb

## 2019-06-27 DIAGNOSIS — M7660 Achilles tendinitis, unspecified leg: Secondary | ICD-10-CM | POA: Diagnosis not present

## 2019-06-27 DIAGNOSIS — M79671 Pain in right foot: Secondary | ICD-10-CM | POA: Diagnosis not present

## 2019-06-27 NOTE — Patient Instructions (Signed)
This acts like achilles tendinitis and poss  Partial tear  But not sure  Will be notified about  Sports  medicine  For evaluation.     Rosen's Emergency Medicine: Concepts and Clinical Practice (9th ed., pp. [id number]). [place], [place]: [place]. Retrieved from https://www.clinicalkey.com/#!/content/book/3-[email or web address]-[email or web address]?[email or web address]">  Achilles Tendinitis  Achilles tendinitis is inflammation of the tough, cord-like band that attaches the lower leg muscles to the heel bone (Achilles tendon). This is usually caused by overusing the tendon and the ankle joint. Achilles tendinitis usually gets better over time with treatment and caring for yourself at home. It can take weeks or months to heal completely. What are the causes? This condition may be caused by:  A sudden increase in exercise or activity, such as running.  Doing the same exercises or activities, such as jumping, over and over.  Not warming up calf muscles before exercising.  Exercising in shoes that are worn out or not made for exercise.  Having arthritis or a bone growth (spur) on the back of the heel bone. This can rub against the tendon and hurt it.  Age-related wear and tear. Tendons become less flexible with age and are more likely to be injured. What are the signs or symptoms? Common symptoms of this condition include:  Pain in the Achilles tendon or in the back of the leg, just above the heel. The pain usually gets worse with exercise.  Stiffness or soreness in the back of the leg, especially in the morning.  Swelling of the skin over the Achilles tendon.  Thickening of the tendon.  Trouble standing on tiptoe. How is this diagnosed? This condition is diagnosed based on your symptoms and a physical exam. You may have tests, including:  X-rays.  MRI. How is this treated? The goal of treatment is to relieve symptoms and help your injury heal. Treatment may include:  Decreasing or  stopping activities that caused the tendinitis. This may mean switching to low-impact exercises like biking or swimming.  Icing the injured area.  Doing physical therapy, including strengthening and stretching exercises.  Taking NSAIDs, such as ibuprofen, to help relieve pain and swelling.  Using supportive shoes, wraps, heel lifts, or a walking boot (air cast).  Having surgery. This may be done if your symptoms do not improve after other treatments.  Using high-energy shock wave impulses to stimulate the healing process (extracorporeal shock wave therapy). This is rare.  Having an injection of medicines that help relieve inflammation (corticosteroids). This is rare. Follow these instructions at home: If you have an air cast:  Wear the air cast as told by your health care provider. Remove it only as told by your health care provider.  Loosen it if your toes tingle, become numb, or turn cold and blue.  Keep it clean.  If the air cast is not waterproof: ? Do not let it get wet. ? Cover it with a watertight covering when you take a bath or shower. Managing pain, stiffness, and swelling   If directed, put ice on the injured area. To do this: ? If you have a removable air cast, remove it as told by your health care provider. ? Put ice in a plastic bag. ? Place a towel between your skin and the bag. ? Leave the ice on for 20 minutes, 2-3 times a day.  Move your toes often to reduce stiffness and swelling.  Raise (elevate) your foot above the level of your heart while you are sitting or lying down.  Activity  Gradually return to your normal activities as told by your health care provider. Ask your health care provider what activities are safe for you.  Do not do activities that cause pain.  Consider doing low-impact exercises, like cycling or swimming.  Ask your health care provider when it is safe to drive if you have an air cast on your foot.  If physical therapy was  prescribed, do exercises as told by your health care provider or physical therapist. General instructions  If directed, wrap your foot with an elastic bandage or other wrap. This can help to keep your tendon from moving too much while it heals. Your health care provider will show you how to wrap your foot correctly.  Wear supportive shoes or heel lifts only as told by your health care provider.  Take over-the-counter and prescription medicines only as told by your health care provider.  Keep all follow-up visits as told by your health care provider. This is important. Contact a health care provider if you:  Have symptoms that get worse.  Have pain that does not get better with medicine.  Develop new, unexplained symptoms.  Develop warmth and swelling in your foot.  Have a fever. Get help right away if you:  Have a sudden popping sound or sensation in your Achilles tendon followed by severe pain.  Cannot move your toes or foot.  Cannot put any weight on your foot.  Your foot or toes become numb and look white or blue even after loosening your bandage or air cast. Summary  Achilles tendinitis is inflammation of the tough, cord-like band that attaches the lower leg muscles to the heel bone (Achilles tendon).  This condition is usually caused by overusing the tendon and the ankle joint. It can also be caused by arthritis or normal aging.  The most common symptoms of this condition include pain, swelling, or stiffness in the Achilles tendon or in the back of the leg.  This condition is usually treated by decreasing or stopping activities that caused the tendinitis, icing the injured area, taking NSAIDs, and doing physical therapy. This information is not intended to replace advice given to you by your health care provider. Make sure you discuss any questions you have with your health care provider. Document Revised: 06/06/2018 Document Reviewed: 06/06/2018 Elsevier Patient  Education  [place].

## 2019-07-05 ENCOUNTER — Encounter: Payer: Self-pay | Admitting: Family Medicine

## 2019-07-05 ENCOUNTER — Other Ambulatory Visit: Payer: Self-pay

## 2019-07-05 ENCOUNTER — Ambulatory Visit: Payer: Self-pay

## 2019-07-05 ENCOUNTER — Ambulatory Visit (INDEPENDENT_AMBULATORY_CARE_PROVIDER_SITE_OTHER): Payer: Medicare Other | Admitting: Family Medicine

## 2019-07-05 VITALS — BP 110/70 | HR 73 | Ht 62.0 in | Wt 176.0 lb

## 2019-07-05 DIAGNOSIS — S86011A Strain of right Achilles tendon, initial encounter: Secondary | ICD-10-CM | POA: Insufficient documentation

## 2019-07-05 DIAGNOSIS — M79671 Pain in right foot: Secondary | ICD-10-CM

## 2019-07-05 NOTE — Progress Notes (Signed)
[facility] [address] [facility] Phone: [phone] Subjective:    I'm seeing this patient by the request  of:  Panosh, Standley Brooking, MD  CC: Ankle pain  [id number]  Michelle [name] is a 79 y.o. female coming in with complaint of right heel pain. Patient states her pain began in March after wearing shoes that were too big for her. Has been stretching, icing and taking NSAIDs.  Pain increases with walking and it does wake her up at night.  Patient states that the pain is fairly severe at this moment.  Does not remember any specific activity that caused it or any audible popping when the pain started.     Past Medical History:  Diagnosis Date  . 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 Hypertension   . Osteopenia    Dr.John McComb   Past Surgical History:  Procedure Laterality Date  . BACK SURGERY  09/2010;02/2012   DISC REMOVED AND SPACER/FUSION PLACED   . CATARACT EXTRACTION, BILATERAL    . COLONOSCOPY     negative  . TOTAL ABDOMINAL HYSTERECTOMY W/ BILATERAL SALPINGOOPHORECTOMY     For Fibroids  . TUBAL LIGATION     Social History   Socioeconomic History  . Marital status: Widowed    Spouse name: Not on file  . Number of children: Not on file  . Years of education: Not on file  . Highest education level: Not on file  Occupational History  . Occupation: Retired  Tobacco Use  . Smoking status: Never Smoker  . Smokeless tobacco: Never Used  Substance and Sexual Activity  . Alcohol use: No  . Drug use: No  . Sexual activity: Not on file  Other Topics Concern  . Not on file  Social History Narrative   9 hours of sleep per night   Single living in the home (widow)   No pets   Retired   Children 3  . 2 local 1 in [place] and  then [place] back to [employer] with exercise.   Childbirth x3   Social Determinants of Health   Financial Resource Strain:   . Difficulty of Paying Living  Expenses:   Food Insecurity:   . Worried About Charity fundraiser in the Last Year:   . Arboriculturist in the Last Year:   Transportation Needs:   . Film/video editor (Medical):   Marland Kitchen Lack of Transportation (Non-Medical):   Physical Activity:   . Days of Exercise per Week:   . Minutes of Exercise per Session:   Stress:   . Feeling of Stress :   Social Connections:   . Frequency of Communication with Friends and Family:   . Frequency of Social Gatherings with Friends and Family:   . Attends Religious Services:   . Active Member of Clubs or Organizations:   . Attends Archivist Meetings:   Marland Kitchen Marital Status:    No Known Allergies Family History  Problem Relation Age of Onset  . Kidney cancer Father        prostate   . Cancer Father   . Hypertension Mother        died at age 32   . Autoimmune disease Mother        Hemolytic Anemia  . Breast cancer Maternal Aunt   . Hypertension Maternal Aunt   . Stroke  Maternal Grandfather 66  . Osteoporosis Other        Mother, 2 M aunts , MGM  . Atrial fibrillation Daughter 1  . Diabetes Neg Hx   . Heart disease Neg Hx      Current Outpatient Medications (Cardiovascular):  .  chlorthalidone (HYGROTON) 25 MG tablet, TAKE 1/2-1 TABLET BY MOUTH EVERY DAY AS DIRECTED .  losartan (COZAAR) 100 MG tablet, TAKE 1 TABLET BY MOUTH EVERY DAY .  metoprolol succinate (TOPROL-XL) 50 MG 24 hr tablet, TAKE 1 TABLET BY MOUTH EVERY DAY    Current Outpatient Medications (Hematological):  .  folic acid (FOLVITE) 1 MG tablet, Take 1 mg by mouth daily.    Current Outpatient Medications (Other):  Marland Kitchen  Cholecalciferol (VITAMIN D) 2000 UNITS CAPS, Take 1 capsule by mouth daily.  .  methotrexate (RHEUMATREX) 2.5 MG tablet, Take 12.5 mg by mouth once a week. Caution:Chemotherapy. Protect from light. TAKES ON SAT .  Multiple Vitamin (MULTIVITAMIN) tablet, Take 1 tablet by mouth daily. .  Multiple Vitamins-Minerals (PRESERVISION AREDS) TABS,  Take 1 tablet by mouth 2 (two) times daily.   Reviewed prior external information including notes and imaging from  primary care provider As well as notes that were available from care everywhere and other healthcare systems.  Past medical history, social, surgical and family history all reviewed in electronic medical record.  No pertanent information unless stated regarding to the chief complaint.   Review of Systems:  No headache, visual changes, nausea, vomiting, diarrhea, constipation, dizziness, abdominal pain, skin rash, fevers, chills, night sweats, weight loss, swollen lymph nodes, body aches, joint swelling, chest pain, shortness of breath, mood changes. POSITIVE muscle aches  Objective  Blood pressure 110/70, pulse 73, height 5\' 2"  (1.575 m), weight 176 lb (79.8 kg), last menstrual period 04/19/1997, SpO2 94 %.   General: No apparent distress alert and oriented x3 mood and affect normal, dressed appropriately.  HEENT: Pupils equal, extraocular movements intact  Respiratory: Patient's speak in full sentences and does not appear short of breath  Cardiovascular: No lower extremity edema, non tender, no erythema  Neuro: Cranial nerves II through XII are intact, neurovascularly intact in all extremities with 2+ DTRs and 2+ pulses.  Gait severely antalgic Right ankle shows the patient does have swelling around the Achilles near the insertion.  Patient has a very mild Haglund nodule noted.  Patient is almost tenderness approximately 2 cm proximal to the insertion.  Neurovascularly intact distally.  Some mild increase in discomfort with straight leg test on the right side.   Limited musculoskeletal ultrasound was performed and interpreted by Lyndal Pulley  Limited ultrasound of patient's Achilles does show there is intrasubstance tearing with increased neovascularization and hypoechoic changes surrounding the area.  No true retraction noted but significant either calcific deposits versus  possible rheumatoid nodules noted. Impression and Recommendations:     The above documentation has been reviewed and is accurate and complete Lyndal Pulley, DO       Note: This dictation was prepared with Dragon dictation along with smaller phrase technology. Any transcriptional errors that result from this process are unintentional.

## 2019-07-05 NOTE — Patient Instructions (Signed)
Cam walker  Change to shoe when driving When walking wear boot  Voltaren gel  Ice 20 min a day  See me in 3 weeks

## 2019-07-05 NOTE — Assessment & Plan Note (Signed)
Patient does have a partial tear of the right Achilles.  Patient does have what appears to be some potential rheumatoid or autoimmune nodules and patient does have psoriatic arthritis that likely could be an exacerbation as well.  Patient wants to hold on any antiinflammatories but will try topical.  CAM Walker given today.  Discussed avoiding significant walking for some time.  Will make sure patient is healing and follow-up in 2 to 3 weeks.  X-rays pending

## 2019-07-26 ENCOUNTER — Encounter: Payer: Self-pay | Admitting: Family Medicine

## 2019-07-26 ENCOUNTER — Ambulatory Visit: Payer: Self-pay

## 2019-07-26 ENCOUNTER — Ambulatory Visit (INDEPENDENT_AMBULATORY_CARE_PROVIDER_SITE_OTHER): Payer: Medicare Other | Admitting: Family Medicine

## 2019-07-26 ENCOUNTER — Other Ambulatory Visit: Payer: Self-pay

## 2019-07-26 VITALS — BP 132/82 | HR 74 | Ht 62.0 in | Wt 173.0 lb

## 2019-07-26 DIAGNOSIS — S86011D Strain of right Achilles tendon, subsequent encounter: Secondary | ICD-10-CM

## 2019-07-26 DIAGNOSIS — L4050 Arthropathic psoriasis, unspecified: Secondary | ICD-10-CM

## 2019-07-26 DIAGNOSIS — M79671 Pain in right foot: Secondary | ICD-10-CM

## 2019-07-26 DIAGNOSIS — G8929 Other chronic pain: Secondary | ICD-10-CM

## 2019-07-26 MED ORDER — PREDNISONE 20 MG PO TABS: 20.0000 mg | ORAL_TABLET | Freq: Every day | ORAL | 0 refills | Status: DC

## 2019-07-26 NOTE — Progress Notes (Signed)
[facility] [address] [place] Phone: [phone] Subjective:   I Michelle [name] am serving as a Education administrator for Dr. Hulan Saas.  This visit occurred during the SARS-CoV-2 public health emergency.  Safety protocols were in place, including screening questions prior to the visit, additional usage of staff PPE, and extensive cleaning of exam room while observing appropriate contact time as indicated for disinfecting solutions.   I'm seeing this patient by the request  of:  Panosh, Standley Brooking, MD  CC: Left heel pain follow-up  [id number]   07/05/2019 Patient does have a partial tear of the right Achilles.  Patient does have what appears to be some potential rheumatoid or autoimmune nodules and patient does have psoriatic arthritis that likely could be an exacerbation as well.  Patient wants to hold on any antiinflammatories but will try topical.  CAM Walker given today.  Discussed avoiding significant walking for some time.  Will make sure patient is healing and follow-up in 2 to 3 weeks.  X-rays pending  Update 07/26/2019 Michelle [name] is a 79 y.o. female coming in with complaint of right heel pain. Patient states that she has been wearing the boot diligently. Was feeling good until Monday. Woke up in middle of the night and when she stepped out of bed her foot pain seemed to have increase quite a bit over the posterior aspect of right heel. Took Advil yesterday for pain. Pain has not gone away since Monday. Patient was on her feet around the house on Sunday but she took breaks and had on her boot. Did fly one week ago but did not have any issues as she used a wheel chair in airport.      Past Medical History:  Diagnosis Date  . 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 Hypertension   . Osteopenia    Dr.John McComb   Past Surgical History:  Procedure Laterality Date  . BACK SURGERY  09/2010;02/2012   DISC REMOVED AND SPACER/FUSION PLACED   .  CATARACT EXTRACTION, BILATERAL    . COLONOSCOPY     negative  . TOTAL ABDOMINAL HYSTERECTOMY W/ BILATERAL SALPINGOOPHORECTOMY     For Fibroids  . TUBAL LIGATION     Social History   Socioeconomic History  . Marital status: Widowed    Spouse name: Not on file  . Number of children: Not on file  . Years of education: Not on file  . Highest education level: Not on file  Occupational History  . Occupation: Retired  Tobacco Use  . Smoking status: Never Smoker  . Smokeless tobacco: Never Used  Vaping Use  . Vaping Use: Never used  Substance and Sexual Activity  . Alcohol use: No  . Drug use: No  . Sexual activity: Not on file  Other Topics Concern  . Not on file  Social History Narrative   9 hours of sleep per night   Single living in the home (widow)   No pets   Retired   Children 3  . 2 local 1 in [place] and  then [place] back to [employer] with exercise.   Childbirth x3   Social Determinants of Health   Financial Resource Strain:   . Difficulty of Paying Living Expenses:   Food Insecurity:   . Worried About Charity fundraiser in the Last Year:   . [employer]  in the Last Year:   Transportation Needs:   . Film/video editor (Medical):   Marland Kitchen Lack of Transportation (Non-Medical):   Physical Activity:   . Days of Exercise per Week:   . Minutes of Exercise per Session:   Stress:   . Feeling of Stress :   Social Connections:   . Frequency of Communication with Friends and Family:   . Frequency of Social Gatherings with Friends and Family:   . Attends Religious Services:   . Active Member of Clubs or Organizations:   . Attends Archivist Meetings:   Marland Kitchen Marital Status:    No Known Allergies Family History  Problem Relation Age of Onset  . Kidney cancer Father        prostate   . Cancer Father   . Hypertension Mother        died at age 82   . Autoimmune disease Mother        Hemolytic Anemia  . Breast cancer  Maternal Aunt   . Hypertension Maternal Aunt   . Stroke Maternal Grandfather 57  . Osteoporosis Other        Mother, 2 M aunts , MGM  . Atrial fibrillation Daughter 38  . Diabetes Neg Hx   . Heart disease Neg Hx     Current Outpatient Medications (Endocrine & Metabolic):  .  predniSONE (DELTASONE) 20 MG tablet, Take 1 tablet (20 mg total) by mouth daily with breakfast.  Current Outpatient Medications (Cardiovascular):  .  chlorthalidone (HYGROTON) 25 MG tablet, TAKE 1/2-1 TABLET BY MOUTH EVERY DAY AS DIRECTED .  losartan (COZAAR) 100 MG tablet, TAKE 1 TABLET BY MOUTH EVERY DAY .  metoprolol succinate (TOPROL-XL) 50 MG 24 hr tablet, TAKE 1 TABLET BY MOUTH EVERY DAY    Current Outpatient Medications (Hematological):  .  folic acid (FOLVITE) 1 MG tablet, Take 1 mg by mouth daily.    Current Outpatient Medications (Other):  Marland Kitchen  Cholecalciferol (VITAMIN D) 2000 UNITS CAPS, Take 1 capsule by mouth daily.  .  methotrexate (RHEUMATREX) 2.5 MG tablet, Take 12.5 mg by mouth once a week. Caution:Chemotherapy. Protect from light. TAKES ON SAT .  Multiple Vitamin (MULTIVITAMIN) tablet, Take 1 tablet by mouth daily. .  Multiple Vitamins-Minerals (PRESERVISION AREDS) TABS, Take 1 tablet by mouth 2 (two) times daily.   Reviewed prior external information including notes and imaging from  primary care provider As well as notes that were available from care everywhere and other healthcare systems.  Past medical history, social, surgical and family history all reviewed in electronic medical record.  No pertanent information unless stated regarding to the chief complaint.   Review of Systems:  No headache, visual changes, nausea, vomiting, diarrhea, constipation, dizziness, abdominal pain, skin rash, fevers, chills, night sweats, weight loss, swollen lymph nodes, body aches, joint swelling, chest pain, shortness of breath, mood changes. POSITIVE muscle aches  Objective  Blood pressure 132/82,  pulse 74, height 5\' 2"  (1.575 m), weight 173 lb (78.5 kg), last menstrual period 04/19/1997, SpO2 97 %.   General: No apparent distress alert and oriented x3 mood and affect normal, dressed appropriately.  HEENT: Pupils equal, extraocular movements intact  Respiratory: Patient's speak in full sentences and does not appear short of breath  Cardiovascular: No lower extremity edema, non tender, no erythema  Neuro: Cranial nerves II through XII are intact, neurovascularly intact in all extremities with 2+ DTRs and 2+ pulses.  Gait severely antalgic favoring the right ankle. Right Achilles  severely tender.  Warm to touch.  Negative [place] Silos still noted though.  Patient does have full range of motion of the ankle.  Limited musculoskeletal ultrasound was performed and interpreted by Lyndal Pulley  Limited ultrasound of patient's can acutely show that in the insertion still no tear appreciated but 2 cm proximal where patient's previous tear was appears to be a new acute tear with likely new bleeding noted.  Hyperechoic changes.  Does not seem to be completely calcific.  Differential could include an early rheumatoid nodule as well. 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 Any transcriptional errors that result from this process are unintentional.

## 2019-07-26 NOTE — Assessment & Plan Note (Signed)
Possible acute on chronic tear noted.  No significant improvement in secondary to this.  Continue CAM Walker home exercises.  Due to patient's age and instability patient will need to continue the cam walker and is unable to do crutches patient will try to avoid walking on a regular basis and follow-up again in 2 to 3 weeks.  Worsening symptoms or no improvement MRI may be necessary

## 2019-07-26 NOTE — Patient Instructions (Addendum)
Good to see you  Back to the boot prednisone daily for 7 days then stop  Ice every 4-6 hours regularly.  When sitting come out of the boot and move the ankle a lot.  See me again in 3 weeks (ok to double book if needed)

## 2019-08-08 ENCOUNTER — Other Ambulatory Visit: Payer: Self-pay

## 2019-08-08 ENCOUNTER — Telehealth: Payer: Self-pay | Admitting: Family Medicine

## 2019-08-08 DIAGNOSIS — S86001D Unspecified injury of right Achilles tendon, subsequent encounter: Secondary | ICD-10-CM

## 2019-08-08 NOTE — Telephone Encounter (Signed)
Patient called asking if the MRI could be ordered for her. She wanted to try to have it done before her follow up with 07/14. I told her it might take longer to get it done depending on how their schedule is.  Please advise.

## 2019-08-08 NOTE — Telephone Encounter (Signed)
MRI placed and patient notified to call [facility] to schedule.

## 2019-08-11 ENCOUNTER — Ambulatory Visit
Admission: RE | Admit: 2019-08-11 | Discharge: 2019-08-11 | Disposition: A | Discharge status: Home / Self Care | Payer: Medicare Other | Source: Ambulatory Visit | Attending: Family Medicine | Admitting: Family Medicine

## 2019-08-11 DIAGNOSIS — S86001D Unspecified injury of right Achilles tendon, subsequent encounter: Secondary | ICD-10-CM

## 2019-08-14 ENCOUNTER — Telehealth: Payer: Self-pay | Admitting: Family Medicine

## 2019-08-14 NOTE — Telephone Encounter (Signed)
Patient called in response to her MRI results. She said that while taking the Prednisone she did not have any pain but 3 days later the pain returned. If she does not take Tylenol or another type of pain reliever the pain is "very bad" even while wearing the boot and icing it.  She is scheduled to be seen on Wednesday but wanted Dr Tamala Julian to be aware.

## 2019-08-16 ENCOUNTER — Ambulatory Visit: Payer: Self-pay

## 2019-08-16 ENCOUNTER — Other Ambulatory Visit: Payer: Self-pay

## 2019-08-16 ENCOUNTER — Ambulatory Visit (INDEPENDENT_AMBULATORY_CARE_PROVIDER_SITE_OTHER): Payer: Medicare Other | Admitting: Family Medicine

## 2019-08-16 ENCOUNTER — Encounter: Payer: Self-pay | Admitting: Family Medicine

## 2019-08-16 VITALS — BP 110/84 | HR 63 | Ht 62.0 in | Wt 171.0 lb

## 2019-08-16 DIAGNOSIS — S86011D Strain of right Achilles tendon, subsequent encounter: Secondary | ICD-10-CM | POA: Diagnosis not present

## 2019-08-16 MED ORDER — NITROGLYCERIN 0.2 MG/HR TD PT24
MEDICATED_PATCH | TRANSDERMAL | 0 refills | Status: DC
Start: 2019-08-16 — End: 2020-01-09

## 2019-08-16 NOTE — Assessment & Plan Note (Signed)
Patient did have a partial tear of the Achilles.  Still has a calcific changes noted.  History of the rheumatoid arthritis that is contributing to the slow healing.  We will start pneumatic heel brace in the house but continue the cam walker out of the house.  Patient is adamant she would like to start with formal physical therapy and will start with more range of motion and other modalities to help her aid in healing.  We discussed the potential for the nitroglycerin patches.  We discussed with her she would be the oldest individual I had tried this and and warned her significantly on different side effects.  Patient's work-up otherwise though has been relatively well.  Patient would like to take the chance of this nitroglycerin patch but I encouraged her to only wear it during the day and if any side effects to discontinue immediately.  Patient is in agreement with the plan.  Patient will follow up with me again in 4 weeks and we will ultrasound again at that time.

## 2019-08-16 NOTE — Progress Notes (Signed)
[place] Phone: [phone] Subjective:   [name], am serving as a scribe for Dr. Hulan Saas. This visit occurred during the SARS-CoV-2 public health emergency.  Safety protocols were in place, including screening questions prior to the visit, additional usage of staff PPE, and extensive cleaning of exam room while observing appropriate contact time as indicated for disinfecting solutions.   I'm seeing this patient by the request  of:  Panosh, Standley Brooking, MD  CC: Ankle pain follow-up  [id number]   07/26/2019 Possible acute on chronic tear noted.  No significant improvement in secondary to this.  Continue CAM Walker home exercises.  Due to patient's age and instability patient will need to continue the cam walker and is unable to do crutches patient will try to avoid walking on a regular basis and follow-up again in 2 to 3 weeks.  Worsening symptoms or no improvement MRI may be necessary  Update 08/16/2019 Michelle [name] is a 79 y.o. female coming in with complaint of right achilles tear. States that she has been wearing the boot, icing, and exercising. Prednisone helped alleviate pain until she finished course. Has been using Tylenol and Advil for pain. Has been doing ABCs TID. Notices swelling over distal achilles.   MRI 08/11/2019  IMPRESSION: 1. 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 2. Intact medial and lateral ankle ligaments and tendons. 3. No acute bony findings.  The MRI was independently visualized by me.  Past Medical History:  Diagnosis Date  . 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 Hypertension   . Osteopenia    Dr.John McComb   Past Surgical History:  Procedure Laterality Date  . BACK SURGERY  09/2010;02/2012   DISC REMOVED AND SPACER/FUSION PLACED   . CATARACT EXTRACTION, BILATERAL    . COLONOSCOPY     negative  . TOTAL  ABDOMINAL HYSTERECTOMY W/ BILATERAL SALPINGOOPHORECTOMY     For Fibroids  . TUBAL LIGATION     Social History   Socioeconomic History  . Marital status: Widowed    Spouse name: Not on file  . Number of children: Not on file  . Years of education: Not on file  . Highest education level: Not on file  Occupational History  . Occupation: Retired  Tobacco Use  . Smoking status: Never Smoker  . Smokeless tobacco: Never Used  Vaping Use  . Vaping Use: Never used  Substance and Sexual Activity  . Alcohol use: No  . Drug use: No  . Sexual activity: Not on file  Other Topics Concern  . Not on file  Social History Narrative   9 hours of sleep per night   Single living in the home (widow)   No pets   Retired   Children 3  . 2 local 1 in [place] and  then [place] back to [employer] with exercise.   Childbirth x3   Social Determinants of Health   Financial Resource Strain:   . Difficulty of Paying Living Expenses:   Food Insecurity:   . Worried About Charity fundraiser in the Last Year:   . Arboriculturist in the Last Year:   Transportation Needs:   . Film/video editor (Medical):   Marland Kitchen Lack of Transportation (Non-Medical):   Physical Activity:   . Days of Exercise per Week:   .  Minutes of Exercise per Session:   Stress:   . Feeling of Stress :   Social Connections:   . Frequency of Communication with Friends and Family:   . Frequency of Social Gatherings with Friends and Family:   . Attends Religious Services:   . Active Member of Clubs or Organizations:   . Attends Archivist Meetings:   Marland Kitchen Marital Status:    No Known Allergies Family History  Problem Relation Age of Onset  . Kidney cancer Father        prostate   . Cancer Father   . Hypertension Mother        died at age 66   . Autoimmune disease Mother        Hemolytic Anemia  . Breast cancer Maternal Aunt   . Hypertension Maternal Aunt   . Stroke Maternal  Grandfather 57  . Osteoporosis Other        Mother, 2 M aunts , MGM  . Atrial fibrillation Daughter 31  . Diabetes Neg Hx   . Heart disease Neg Hx     Current Outpatient Medications (Endocrine & Metabolic):  .  predniSONE (DELTASONE) 20 MG tablet, Take 1 tablet (20 mg total) by mouth daily with breakfast.  Current Outpatient Medications (Cardiovascular):  .  chlorthalidone (HYGROTON) 25 MG tablet, TAKE 1/2-1 TABLET BY MOUTH EVERY DAY AS DIRECTED .  losartan (COZAAR) 100 MG tablet, TAKE 1 TABLET BY MOUTH EVERY DAY .  metoprolol succinate (TOPROL-XL) 50 MG 24 hr tablet, TAKE 1 TABLET BY MOUTH EVERY DAY .  nitroGLYCERIN (NITRO-DUR) 0.2 mg/hr patch, Apply 1/4 of a patch to skin once daily.    Current Outpatient Medications (Hematological):  .  folic acid (FOLVITE) 1 MG tablet, Take 1 mg by mouth daily.    Current Outpatient Medications (Other):  Marland Kitchen  Cholecalciferol (VITAMIN D) 2000 UNITS CAPS, Take 1 capsule by mouth daily.  .  methotrexate (RHEUMATREX) 2.5 MG tablet, Take 12.5 mg by mouth once a week. Caution:Chemotherapy. Protect from light. TAKES ON SAT .  Multiple Vitamin (MULTIVITAMIN) tablet, Take 1 tablet by mouth daily. .  Multiple Vitamins-Minerals (PRESERVISION AREDS) TABS, Take 1 tablet by mouth 2 (two) times daily.   Reviewed prior external information including notes and imaging from  primary care provider As well as notes that were available from care everywhere and other healthcare systems.  Past medical history, social, surgical and family history all reviewed in electronic medical record.  No pertanent information unless stated regarding to the chief complaint.   Review of Systems:  No headache, visual changes, nausea, vomiting, diarrhea, constipation, dizziness, abdominal pain, skin rash, fevers, chills, night sweats, weight loss, swollen lymph nodes, body aches, joint swelling, chest pain, shortness of breath, mood changes. POSITIVE muscle aches  Objective  Blood  pressure 110/84, pulse 63, height 5\' 2"  (1.575 m), weight 171 lb (77.6 kg), last menstrual period 04/19/1997, SpO2 97 %.   General: No apparent distress alert and oriented x3 mood and affect normal, dressed appropriately.  HEENT: Pupils equal, extraocular movements intact  Respiratory: Patient's speak in full sentences and does not appear short of breath  Cardiovascular: No lower extremity edema, non tender, no erythema  Neuro: Cranial nerves II through XII are intact, neurovascularly intact in all extremities with 2+ DTRs and 2+ pulses.  Gait severely antalgic favoring the right side  Right ankle shows the patient still has some mild swelling over the Achilles itself near the insertion and 1 cm proximal.  Patient does have full range of motion of the ankle noted.  Neurovascularly intact.  Did not do strength testing today.  Limited musculoskeletal ultrasound was performed and interpreted by Lyndal Pulley  Limited ultrasound of patient's right ankle shows patient's acutely still has calcific changes and increasing Doppler flow with neovascularization throughout the diameter of the Achilles.  No retraction noted. 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   Impression and Recommendations:     The above documentation has been reviewed and is accurate and complete Lyndal Pulley, DO       Note: This dictation was prepared with Dragon dictation along with smaller phrase technology. Any transcriptional errors that result from this process are unintentional.

## 2019-08-16 NOTE — Patient Instructions (Addendum)
Boot out of house for 3 weeks Pneumatic heel brace in house PT [facility] Nitroglycerin Protocol   Apply 1/4 nitroglycerin patch to affected area daily.  Change position of patch within the affected area every 12 hours with 12 hours off. DO NOT WEAR IT AT NIGHT.  You may experience a headache during the first 1-2 weeks of using the patch, these should subside.  If you experience headaches after beginning nitroglycerin patch treatment, you may take your preferred over the counter pain reliever.  Another side effect of the nitroglycerin patch is skin irritation or rash related to patch adhesive.  Please notify our office if you develop more severe headaches or rash, and stop the patch.  Tendon healing with nitroglycerin patch may require 12 to 24 weeks depending on the extent of injury.  Men should not use if taking Viagra, Cialis, or Levitra.   Do not use if you have migraines or rosacea. Any side effects please discontinue patch immediately.  See me again in 4 weeks

## 2019-08-21 DIAGNOSIS — R262 Difficulty in walking, not elsewhere classified: Secondary | ICD-10-CM | POA: Diagnosis not present

## 2019-08-21 DIAGNOSIS — M25571 Pain in right ankle and joints of right foot: Secondary | ICD-10-CM | POA: Diagnosis not present

## 2019-08-24 DIAGNOSIS — M25571 Pain in right ankle and joints of right foot: Secondary | ICD-10-CM | POA: Diagnosis not present

## 2019-08-24 DIAGNOSIS — R262 Difficulty in walking, not elsewhere classified: Secondary | ICD-10-CM | POA: Diagnosis not present

## 2019-08-26 ENCOUNTER — Other Ambulatory Visit: Payer: Self-pay | Admitting: Internal Medicine

## 2019-09-04 ENCOUNTER — Other Ambulatory Visit: Payer: Medicare Other

## 2019-09-04 DIAGNOSIS — M25571 Pain in right ankle and joints of right foot: Secondary | ICD-10-CM | POA: Diagnosis not present

## 2019-09-04 DIAGNOSIS — R262 Difficulty in walking, not elsewhere classified: Secondary | ICD-10-CM | POA: Diagnosis not present

## 2019-09-06 DIAGNOSIS — R262 Difficulty in walking, not elsewhere classified: Secondary | ICD-10-CM | POA: Diagnosis not present

## 2019-09-06 DIAGNOSIS — M25571 Pain in right ankle and joints of right foot: Secondary | ICD-10-CM | POA: Diagnosis not present

## 2019-09-12 DIAGNOSIS — M25571 Pain in right ankle and joints of right foot: Secondary | ICD-10-CM | POA: Diagnosis not present

## 2019-09-12 DIAGNOSIS — R262 Difficulty in walking, not elsewhere classified: Secondary | ICD-10-CM | POA: Diagnosis not present

## 2019-09-13 ENCOUNTER — Ambulatory Visit (INDEPENDENT_AMBULATORY_CARE_PROVIDER_SITE_OTHER): Payer: Medicare Other | Admitting: Family Medicine

## 2019-09-13 ENCOUNTER — Other Ambulatory Visit: Payer: Self-pay

## 2019-09-13 ENCOUNTER — Ambulatory Visit: Payer: Self-pay

## 2019-09-13 ENCOUNTER — Encounter: Payer: Self-pay | Admitting: Family Medicine

## 2019-09-13 VITALS — BP 140/82 | HR 52 | Ht 62.0 in | Wt 172.0 lb

## 2019-09-13 DIAGNOSIS — S86011D Strain of right Achilles tendon, subsequent encounter: Secondary | ICD-10-CM

## 2019-09-13 DIAGNOSIS — M25571 Pain in right ankle and joints of right foot: Secondary | ICD-10-CM | POA: Diagnosis not present

## 2019-09-13 DIAGNOSIS — G8929 Other chronic pain: Secondary | ICD-10-CM | POA: Diagnosis not present

## 2019-09-13 NOTE — Assessment & Plan Note (Signed)
Patient does have a psoriatic arthropathy and did have the calcific tendinitis but seems to be improving.  Partial tear does seem to be improving with the nitroglycerin and we will get patient to be out of the boot at this time.  Patient is to increase activity slowly.  Patient will continue to work with formal physical therapy that I think has been beneficial.  Still will avoid significant different activities such as anything with running or jumping.  Follow-up again 6 to 8 weeks

## 2019-09-13 NOTE — Patient Instructions (Signed)
Continue PT  Continue nitro patch Heel lift or brace with walking See me again in 7-8 weeks

## 2019-09-13 NOTE — Progress Notes (Signed)
[place] Phone: [phone] Subjective:    I'm seeing this patient by the request  of:  Panosh, Standley Brooking, MD  CC: Achilles partial tear formal follow-up  [id number]   08/16/2019 Patient did have a partial tear of the Achilles.  Still has a calcific changes noted.  History of the rheumatoid arthritis that is contributing to the slow healing.  We will start pneumatic heel brace in the house but continue the cam walker out of the house.  Patient is adamant she would like to start with formal physical therapy and will start with more range of motion and other modalities to help her aid in healing.  We discussed the potential for the nitroglycerin patches.  We discussed with her she would be the oldest individual I had tried this and and warned her significantly on different side effects.  Patient's work-up otherwise though has been relatively well.  Patient would like to take the chance of this nitroglycerin patch but I encouraged her to only wear it during the day and if any side effects to discontinue immediately.  Patient is in agreement with the plan.  Patient will follow up with me again in 4 weeks and we will ultrasound again at that time.  Update 09/13/2019 Michelle [name] is a 79 y.o. female coming in with complaint of right achilles pain. Patient has been wearing boot and other brace in the house. Is going to PT. No longer having pain but occasionally she will have pain over bottom of calcaneous. Does not notice heel pain with other brace. Continues to use nitro patch.     Past Medical History:  Diagnosis Date  . 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 Hypertension   . Osteopenia    Dr.John McComb   Past Surgical History:  Procedure Laterality Date  . BACK SURGERY  09/2010;02/2012   DISC REMOVED AND SPACER/FUSION PLACED   . CATARACT EXTRACTION, BILATERAL    . COLONOSCOPY     negative  . TOTAL ABDOMINAL  HYSTERECTOMY W/ BILATERAL SALPINGOOPHORECTOMY     For Fibroids  . TUBAL LIGATION     Social History   Socioeconomic History  . Marital status: Widowed    Spouse name: Not on file  . Number of children: Not on file  . Years of education: Not on file  . Highest education level: Not on file  Occupational History  . Occupation: Retired  Tobacco Use  . Smoking status: Never Smoker  . Smokeless tobacco: Never Used  Vaping Use  . Vaping Use: Never used  Substance and Sexual Activity  . Alcohol use: No  . Drug use: No  . Sexual activity: Not on file  Other Topics Concern  . Not on file  Social History Narrative   9 hours of sleep per night   Single living in the home (widow)   No pets   Retired   Children 3  . 2 local 1 in [place] and  then [place] back to [employer] with exercise.   Childbirth x3   Social Determinants of Health   Financial Resource Strain:   . Difficulty of Paying Living Expenses:   Food Insecurity:   . Worried About Charity fundraiser in the Last Year:   . [place] in the Last Year:   Transportation Needs:   . Lack of  Transportation (Medical):   Marland Kitchen Lack of Transportation (Non-Medical):   Physical Activity:   . Days of Exercise per Week:   . Minutes of Exercise per Session:   Stress:   . Feeling of Stress :   Social Connections:   . Frequency of Communication with Friends and Family:   . Frequency of Social Gatherings with Friends and Family:   . Attends Religious Services:   . Active Member of Clubs or Organizations:   . Attends Archivist Meetings:   Marland Kitchen Marital Status:    No Known Allergies Family History  Problem Relation Age of Onset  . Kidney cancer Father        prostate   . Cancer Father   . Hypertension Mother        died at age 66   . Autoimmune disease Mother        Hemolytic Anemia  . Breast cancer Maternal Aunt   . Hypertension Maternal Aunt   . Stroke Maternal Grandfather 41    . Osteoporosis Other        Mother, 2 M aunts , MGM  . Atrial fibrillation Daughter 24  . Diabetes Neg Hx   . Heart disease Neg Hx     Current Outpatient Medications (Endocrine & Metabolic):  .  predniSONE (DELTASONE) 20 MG tablet, Take 1 tablet (20 mg total) by mouth daily with breakfast.  Current Outpatient Medications (Cardiovascular):  .  chlorthalidone (HYGROTON) 25 MG tablet, Take 1/2 - 1 tablet by mouth every day as directed. Please schedule an office visit for further refills. [phone] .  losartan (COZAAR) 100 MG tablet, Take 1 tablet (100 mg total) by mouth daily. Please schedule office visit for further refills. [phone] .  metoprolol succinate (TOPROL-XL) 50 MG 24 hr tablet, Take 1 tablet (50 mg total) by mouth daily. Please schedule office visit for further refills. [phone] .  nitroGLYCERIN (NITRO-DUR) 0.2 mg/hr patch, Apply 1/4 of a patch to skin once daily.    Current Outpatient Medications (Hematological):  .  folic acid (FOLVITE) 1 MG tablet, Take 1 mg by mouth daily.    Current Outpatient Medications (Other):  Marland Kitchen  Cholecalciferol (VITAMIN D) 2000 UNITS CAPS, Take 1 capsule by mouth daily.  .  methotrexate (RHEUMATREX) 2.5 MG tablet, Take 12.5 mg by mouth once a week. Caution:Chemotherapy. Protect from light. TAKES ON SAT .  Multiple Vitamin (MULTIVITAMIN) tablet, Take 1 tablet by mouth daily. .  Multiple Vitamins-Minerals (PRESERVISION AREDS) TABS, Take 1 tablet by mouth 2 (two) times daily.   Reviewed prior external information including notes and imaging from  primary care provider As well as notes that were available from care everywhere and other healthcare systems.  Past medical history, social, surgical and family history all reviewed in electronic medical record.  No pertanent information unless stated regarding to the chief complaint.   Review of Systems:  No headache, visual changes, nausea, vomiting, diarrhea, constipation, dizziness,  abdominal pain, skin rash, fevers, chills, night sweats, weight loss, swollen lymph nodes, body aches, joint swelling, chest pain, shortness of breath, mood changes. POSITIVE muscle aches  Objective  Blood pressure 140/82, pulse (!) 52, height 5\' 2"  (1.575 m), weight 172 lb (78 kg), last menstrual period 04/19/1997, SpO2 98 %.   General: No apparent distress alert and oriented x3 mood and affect normal, dressed appropriately.  HEENT: Pupils equal, extraocular movements intact  Respiratory: Patient's speak in full sentences and does not appear short of breath  Gait antalgic noted  Right ankle does have some mild limited range of motion in all planes.  Patient is minimally tender to palpation over the Achilles.  Good strength over the Achilles noted today.  Neurovascularly intact distally.  Limited musculoskeletal ultrasound was performed and interpreted by Lyndal Pulley  Limited ultrasound of patient's actually shows that patient has significant decrease in the calcific changes that was previously seen.  Patient does have decreased into the hypoechoic changes as well.  Still some mild thickening of the Achilles approximately 1 to 2 cm proximal to the insertion at the calcaneal region.  No new tearing appreciated. Impression: Interval healing   Impression and Recommendations:     The above documentation has been reviewed and is accurate and complete Lyndal Pulley, DO       Note: This dictation was prepared with Dragon dictation along with smaller phrase technology. Any transcriptional errors that result from this process are unintentional.

## 2019-09-14 DIAGNOSIS — M25571 Pain in right ankle and joints of right foot: Secondary | ICD-10-CM | POA: Diagnosis not present

## 2019-09-14 DIAGNOSIS — R262 Difficulty in walking, not elsewhere classified: Secondary | ICD-10-CM | POA: Diagnosis not present

## 2019-09-19 DIAGNOSIS — R262 Difficulty in walking, not elsewhere classified: Secondary | ICD-10-CM | POA: Diagnosis not present

## 2019-09-19 DIAGNOSIS — M25571 Pain in right ankle and joints of right foot: Secondary | ICD-10-CM | POA: Diagnosis not present

## 2019-09-21 DIAGNOSIS — R262 Difficulty in walking, not elsewhere classified: Secondary | ICD-10-CM | POA: Diagnosis not present

## 2019-09-21 DIAGNOSIS — M25571 Pain in right ankle and joints of right foot: Secondary | ICD-10-CM | POA: Diagnosis not present

## 2019-09-25 ENCOUNTER — Telehealth: Payer: Self-pay | Admitting: Family Medicine

## 2019-09-25 DIAGNOSIS — M25571 Pain in right ankle and joints of right foot: Secondary | ICD-10-CM | POA: Diagnosis not present

## 2019-09-25 DIAGNOSIS — R262 Difficulty in walking, not elsewhere classified: Secondary | ICD-10-CM | POA: Diagnosis not present

## 2019-09-25 NOTE — Telephone Encounter (Signed)
Patient called stating that when she was here on August 12th, Dr Tamala Julian took her out of the boot. She does have a brace but it seems to push on her heel and is painful. She asked if there was anything else she could try or if she does not need to wear a brace at all?  Please advise.

## 2019-09-25 NOTE — Telephone Encounter (Signed)
Spoke with patient about recommendations 

## 2019-09-27 DIAGNOSIS — M2012 Hallux valgus (acquired), left foot: Secondary | ICD-10-CM | POA: Diagnosis not present

## 2019-09-27 DIAGNOSIS — M5136 Other intervertebral disc degeneration, lumbar region: Secondary | ICD-10-CM | POA: Diagnosis not present

## 2019-09-27 DIAGNOSIS — Z79899 Other long term (current) drug therapy: Secondary | ICD-10-CM | POA: Diagnosis not present

## 2019-09-27 DIAGNOSIS — L4050 Arthropathic psoriasis, unspecified: Secondary | ICD-10-CM | POA: Diagnosis not present

## 2019-09-27 DIAGNOSIS — M19072 Primary osteoarthritis, left ankle and foot: Secondary | ICD-10-CM | POA: Diagnosis not present

## 2019-09-27 DIAGNOSIS — M79672 Pain in left foot: Secondary | ICD-10-CM | POA: Diagnosis not present

## 2019-09-27 DIAGNOSIS — M7732 Calcaneal spur, left foot: Secondary | ICD-10-CM | POA: Diagnosis not present

## 2019-09-27 DIAGNOSIS — M1990 Unspecified osteoarthritis, unspecified site: Secondary | ICD-10-CM | POA: Diagnosis not present

## 2019-09-28 DIAGNOSIS — M25571 Pain in right ankle and joints of right foot: Secondary | ICD-10-CM | POA: Diagnosis not present

## 2019-09-28 DIAGNOSIS — R262 Difficulty in walking, not elsewhere classified: Secondary | ICD-10-CM | POA: Diagnosis not present

## 2019-10-03 DIAGNOSIS — M25571 Pain in right ankle and joints of right foot: Secondary | ICD-10-CM | POA: Diagnosis not present

## 2019-10-03 DIAGNOSIS — R262 Difficulty in walking, not elsewhere classified: Secondary | ICD-10-CM | POA: Diagnosis not present

## 2019-10-04 LAB — BASIC METABOLIC PANEL
BUN: 17 (ref 4–21)
CO2: 31 — AB (ref 13–22)
Chloride: 102 (ref 99–108)
Creatinine: 0.7 (ref <=1.1)
Glucose: 84
Potassium: 5 (ref 3.4–5.3)
Sodium: 141 (ref 137–147)

## 2019-10-04 LAB — HEPATIC FUNCTION PANEL
ALT: 21 (ref 7–35)
AST: 15 (ref 13–35)
Alkaline Phosphatase: 104 (ref 25–125)
Bilirubin, Total: 0.3

## 2019-10-04 LAB — CBC AND DIFFERENTIAL
HCT: 40 (ref 36–46)
Hemoglobin: 13.5 (ref 12.0–16.0)
Platelets: 393 (ref 150–399)
WBC: 11.9

## 2019-10-04 LAB — COMPREHENSIVE METABOLIC PANEL
Albumin: 3.5 (ref 3.5–5.0)
Calcium: 9.9 (ref 8.7–10.7)
GFR calc Af Amer: 103.69
GFR calc non Af Amer: 85.69

## 2019-10-04 LAB — CBC: RBC: 4.44 (ref 3.87–5.11)

## 2019-10-05 DIAGNOSIS — R262 Difficulty in walking, not elsewhere classified: Secondary | ICD-10-CM | POA: Diagnosis not present

## 2019-10-05 DIAGNOSIS — M25571 Pain in right ankle and joints of right foot: Secondary | ICD-10-CM | POA: Diagnosis not present

## 2019-10-10 DIAGNOSIS — M25571 Pain in right ankle and joints of right foot: Secondary | ICD-10-CM | POA: Diagnosis not present

## 2019-10-10 DIAGNOSIS — R262 Difficulty in walking, not elsewhere classified: Secondary | ICD-10-CM | POA: Diagnosis not present

## 2019-10-12 DIAGNOSIS — M25571 Pain in right ankle and joints of right foot: Secondary | ICD-10-CM | POA: Diagnosis not present

## 2019-10-12 DIAGNOSIS — R262 Difficulty in walking, not elsewhere classified: Secondary | ICD-10-CM | POA: Diagnosis not present

## 2019-10-17 DIAGNOSIS — R262 Difficulty in walking, not elsewhere classified: Secondary | ICD-10-CM | POA: Diagnosis not present

## 2019-10-17 DIAGNOSIS — M25571 Pain in right ankle and joints of right foot: Secondary | ICD-10-CM | POA: Diagnosis not present

## 2019-10-19 DIAGNOSIS — R262 Difficulty in walking, not elsewhere classified: Secondary | ICD-10-CM | POA: Diagnosis not present

## 2019-10-19 DIAGNOSIS — M25571 Pain in right ankle and joints of right foot: Secondary | ICD-10-CM | POA: Diagnosis not present

## 2019-10-24 DIAGNOSIS — R262 Difficulty in walking, not elsewhere classified: Secondary | ICD-10-CM | POA: Diagnosis not present

## 2019-10-24 DIAGNOSIS — M25571 Pain in right ankle and joints of right foot: Secondary | ICD-10-CM | POA: Diagnosis not present

## 2019-10-26 DIAGNOSIS — R262 Difficulty in walking, not elsewhere classified: Secondary | ICD-10-CM | POA: Diagnosis not present

## 2019-10-26 DIAGNOSIS — M25571 Pain in right ankle and joints of right foot: Secondary | ICD-10-CM | POA: Diagnosis not present

## 2019-10-30 DIAGNOSIS — R262 Difficulty in walking, not elsewhere classified: Secondary | ICD-10-CM | POA: Diagnosis not present

## 2019-10-30 DIAGNOSIS — M25571 Pain in right ankle and joints of right foot: Secondary | ICD-10-CM | POA: Diagnosis not present

## 2019-10-31 NOTE — Progress Notes (Signed)
[place] Phone: [phone] Subjective:   Michelle [name], am serving as a scribe for Dr. Hulan Saas. This visit occurred during the SARS-CoV-2 public health emergency.  Safety protocols were in place, including screening questions prior to the visit, additional usage of staff PPE, and extensive cleaning of exam room while observing appropriate contact time as indicated for disinfecting solutions.   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  Partial tear does seem to be improving with the nitroglycerin and we will get patient to be out of the boot at this time.  Patient is to increase activity slowly.  Patient will continue to work with formal physical therapy that I think has been beneficial.  Still will avoid significant different activities such as anything with running or jumping.  Follow-up again 6 to 8 weeks  Update 11/01/2019 Michelle [name] is a 79 y.o. female coming in with complaint of right achilles tendon tear. States that she began sewing a lot recently and this makes her achilles tendon sore. She also notes pain after going for a walk. Finished physical therapy on Monday. Has been wearing Rolena Infante for walking and running errands. Is using nitro patches daily.  Patient did have MRI that was consistent with the ultrasound findings of the intrasubstance tearing noted.  Patient continued to have calcific changes and has been working hard with physical therapy.       Past Medical History:  Diagnosis Date  . 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 Hypertension   . Osteopenia    Dr.John McComb   Past Surgical History:  Procedure Laterality Date  . BACK SURGERY  09/2010;02/2012   DISC REMOVED AND SPACER/FUSION PLACED   .  CATARACT EXTRACTION, BILATERAL    . COLONOSCOPY     negative  . TOTAL ABDOMINAL HYSTERECTOMY W/ BILATERAL SALPINGOOPHORECTOMY     For Fibroids  . TUBAL LIGATION     Social History   Socioeconomic History  . Marital status: Widowed    Spouse name: Not on file  . Number of children: Not on file  . Years of education: Not on file  . Highest education level: Not on file  Occupational History  . Occupation: Retired  Tobacco Use  . Smoking status: Never Smoker  . Smokeless tobacco: Never Used  Vaping Use  . Vaping Use: Never used  Substance and Sexual Activity  . Alcohol use: [name]  . Drug use: [name]  . Sexual activity: Not on file  Other Topics Concern  . Not on file  Social History Narrative   9 hours of sleep per night   Single living in the home (widow)   [name] pets   Retired   Children 3  . 2 local 1 in [place] and  then [place] back to [employer] with exercise.   Childbirth x3   Social Determinants of Health   Financial Resource Strain:   . Difficulty of Paying Living Expenses: Not on file  Food Insecurity:   . Worried About Charity fundraiser in the Last Year: Not on file  . Ran Out of Food in the Last Year: Not on file  Transportation  Needs:   . Lack of Transportation (Medical): Not on file  . Lack of Transportation (Non-Medical): Not on file  Physical Activity:   . Days of Exercise per Week: Not on file  . Minutes of Exercise per Session: Not on file  Stress:   . Feeling of Stress : Not on file  Social Connections:   . Frequency of Communication with Friends and Family: Not on file  . Frequency of Social Gatherings with Friends and Family: Not on file  . Attends Religious Services: Not on file  . Active Member of Clubs or Organizations: Not on file  . Attends Archivist Meetings: Not on file  . Marital Status: Not on file   [name] Known Allergies Family History  Problem Relation Age of Onset  . Kidney cancer Father         prostate   . Cancer Father   . Hypertension Mother        died at age 20   . Autoimmune disease Mother        Hemolytic Anemia  . Breast cancer Maternal Aunt   . Hypertension Maternal Aunt   . Stroke Maternal Grandfather 44  . Osteoporosis Other        Mother, 2 M aunts , MGM  . Atrial fibrillation Daughter 41  . Diabetes Neg Hx   . Heart disease Neg Hx     Current Outpatient Medications (Endocrine & Metabolic):  .  predniSONE (DELTASONE) 20 MG tablet, Take 1 tablet (20 mg total) by mouth daily with breakfast.  Current Outpatient Medications (Cardiovascular):  .  chlorthalidone (HYGROTON) 25 MG tablet, Take 1/2 - 1 tablet by mouth every day as directed. Please schedule an office visit for further refills. [phone] .  losartan (COZAAR) 100 MG tablet, Take 1 tablet (100 mg total) by mouth daily. Please schedule office visit for further refills. [phone] .  metoprolol succinate (TOPROL-XL) 50 MG 24 hr tablet, Take 1 tablet (50 mg total) by mouth daily. Please schedule office visit for further refills. [phone] .  nitroGLYCERIN (NITRO-DUR) 0.2 mg/hr patch, Apply 1/4 of a patch to skin once daily.    Current Outpatient Medications (Hematological):  .  folic acid (FOLVITE) 1 MG tablet, Take 1 mg by mouth daily.    Current Outpatient Medications (Other):  Marland Kitchen  Cholecalciferol (VITAMIN D) 2000 UNITS CAPS, Take 1 capsule by mouth daily.  .  methotrexate (RHEUMATREX) 2.5 MG tablet, Take 12.5 mg by mouth once a week. Caution:Chemotherapy. Protect from light. TAKES ON SAT .  Multiple Vitamin (MULTIVITAMIN) tablet, Take 1 tablet by mouth daily. .  Multiple Vitamins-Minerals (PRESERVISION AREDS) TABS, Take 1 tablet by mouth 2 (two) times daily.   Reviewed prior external information including notes and imaging from  primary care provider As well as notes that were available from care everywhere and other healthcare systems.  Past medical history, social, surgical and family  history all reviewed in electronic medical record.  [name] pertanent information unless stated regarding to the chief complaint.   Review of Systems:  [name] headache, visual changes, nausea, vomiting, diarrhea, constipation, dizziness, abdominal pain, skin rash, fevers, chills, night sweats, weight loss, swollen lymph nodes, body aches, joint swelling, chest pain, shortness of breath, mood changes. POSITIVE muscle aches  Objective  Blood pressure 124/84, pulse 68, height 5\' 2"  (1.575 m), weight 173 lb (78.5 kg), last menstrual period 04/19/1997, SpO2 97 %.   General: [name] apparent distress alert and oriented x3 mood and affect normal, dressed  appropriately.  HEENT: Pupils equal, extraocular movements intact  Respiratory: Patient's speak in full sentences and does not appear short of breath  Cardiovascular: [name] lower extremity edema, non tender, [name] erythema  Neuro: Cranial nerves II through XII are intact, neurovascularly intact in all extremities with 2+ DTRs and 2+ pulses.  Gait normal with good balance and coordination.  MSK: Mild arthritic changes of multiple joints  Right ankle shows the patient still has some tenderness at the insertion of the Achilles noted.  Patient does have some mild pain with resisted dorsiflexion of the foot.  Patient has a negative Thompson.  Neurovascularly intact distally.  Good strength noted.  Limited musculoskeletal ultrasound was performed and interpreted by Lyndal Pulley  Limited ultrasound of patient's right Achilles tendon shows that there is significant increase in neovascularization but decrease in hypoechoic changes.  Patient still has the calcific changes within the tendon itself.  Increasing Doppler flow of the tendon still noted as well. Impression: Improvement in hypoechoic and swelling but continues to have calcific changes of the Achilles.    Impression and Recommendations:     The above documentation has been reviewed and is accurate and complete  Lyndal Pulley, DO       Note: This dictation was prepared with Dragon dictation along with smaller phrase technology. Any transcriptional errors that result from this process are unintentional.

## 2019-11-01 ENCOUNTER — Encounter: Payer: Self-pay | Admitting: Family Medicine

## 2019-11-01 ENCOUNTER — Other Ambulatory Visit: Payer: Self-pay

## 2019-11-01 ENCOUNTER — Ambulatory Visit: Payer: Self-pay

## 2019-11-01 ENCOUNTER — Ambulatory Visit (INDEPENDENT_AMBULATORY_CARE_PROVIDER_SITE_OTHER): Payer: Medicare Other | Admitting: Family Medicine

## 2019-11-01 VITALS — BP 124/84 | HR 68 | Ht 62.0 in | Wt 173.0 lb

## 2019-11-01 DIAGNOSIS — S86011D Strain of right Achilles tendon, subsequent encounter: Secondary | ICD-10-CM | POA: Diagnosis not present

## 2019-11-01 DIAGNOSIS — L4050 Arthropathic psoriasis, unspecified: Secondary | ICD-10-CM

## 2019-11-01 NOTE — Patient Instructions (Signed)
Heel lift  HOKA Arahi for walking Continue physical therapy for 4-6 weeks, once a week, mostly for ultrasound One more check ion 8 weeks to release you fully

## 2019-11-01 NOTE — Assessment & Plan Note (Addendum)
Patient has made great strides but still has significant amount of calcific deposits in the Achilles itself as well as with increasing hypoechoic changes.  Discussed with patient about the possibility of PRP.  Handout given today.  Patient should do relatively well overall and wants to continue with physical therapy for the iontophoresis.  We discussed the calcific could be secondary to the psoriatic arthritis.  Patient wants to avoid no any injectable or infusions at this time.  Has discussed with the rheumatologist.  Patient wants to get another 6 to 8 weeks.  We discussed proper shoes and a heel lift that I think will be beneficial if she starts increasing her activity and we will see how patient responds.  Total time with patient as well as reviewing patient's chart, imaging including MRI on the date of service 36 minutes

## 2019-11-07 DIAGNOSIS — R262 Difficulty in walking, not elsewhere classified: Secondary | ICD-10-CM | POA: Diagnosis not present

## 2019-11-07 DIAGNOSIS — M25571 Pain in right ankle and joints of right foot: Secondary | ICD-10-CM | POA: Diagnosis not present

## 2019-11-13 ENCOUNTER — Telehealth: Payer: Self-pay | Admitting: Internal Medicine

## 2019-11-13 DIAGNOSIS — R262 Difficulty in walking, not elsewhere classified: Secondary | ICD-10-CM | POA: Diagnosis not present

## 2019-11-13 DIAGNOSIS — M25571 Pain in right ankle and joints of right foot: Secondary | ICD-10-CM | POA: Diagnosis not present

## 2019-11-13 NOTE — Telephone Encounter (Signed)
Left message for patient to call back and schedule Medicare Annual Wellness Visit (AWV) either virtually or in office.  Last AWV 11/08/18; please schedule at anytime with [facility] Nurse Health Advisor 2.  This should be a 45 minute visit.

## 2019-11-15 ENCOUNTER — Ambulatory Visit: Payer: Medicare Other

## 2019-11-18 ENCOUNTER — Ambulatory Visit: Payer: Medicare Other | Attending: Internal Medicine

## 2019-11-18 DIAGNOSIS — Z23 Encounter for immunization: Secondary | ICD-10-CM

## 2019-11-18 NOTE — Progress Notes (Signed)
   Covid-19 Vaccination Clinic  Name:  RONALEE SCHEUNEMANN    MRN: [id number] DOB: 1940/12/25  11/18/2019  Ms. Elbaum was observed post Covid-19 immunization for 15 minutes without incident. She was provided with Vaccine Information Sheet and instruction to access the V-Safe system.   Ms. Brockel was instructed to call 911 with any severe reactions post vaccine: Marland Kitchen Difficulty breathing  . Swelling of face and throat  . A fast heartbeat  . A bad rash all over body  . Dizziness and weakness

## 2019-11-21 DIAGNOSIS — M25571 Pain in right ankle and joints of right foot: Secondary | ICD-10-CM | POA: Diagnosis not present

## 2019-11-21 DIAGNOSIS — R262 Difficulty in walking, not elsewhere classified: Secondary | ICD-10-CM | POA: Diagnosis not present

## 2019-11-27 DIAGNOSIS — M25571 Pain in right ankle and joints of right foot: Secondary | ICD-10-CM | POA: Diagnosis not present

## 2019-11-27 DIAGNOSIS — R262 Difficulty in walking, not elsewhere classified: Secondary | ICD-10-CM | POA: Diagnosis not present

## 2019-11-29 ENCOUNTER — Ambulatory Visit (INDEPENDENT_AMBULATORY_CARE_PROVIDER_SITE_OTHER): Payer: Medicare Other

## 2019-11-29 ENCOUNTER — Other Ambulatory Visit: Payer: Self-pay

## 2019-11-29 DIAGNOSIS — Z23 Encounter for immunization: Secondary | ICD-10-CM | POA: Diagnosis not present

## 2019-11-29 DIAGNOSIS — H353211 Exudative age-related macular degeneration, right eye, with active choroidal neovascularization: Secondary | ICD-10-CM | POA: Diagnosis not present

## 2019-12-02 ENCOUNTER — Other Ambulatory Visit: Payer: Self-pay | Admitting: Internal Medicine

## 2019-12-04 DIAGNOSIS — H353122 Nonexudative age-related macular degeneration, left eye, intermediate dry stage: Secondary | ICD-10-CM | POA: Diagnosis not present

## 2019-12-04 DIAGNOSIS — H35373 Puckering of macula, bilateral: Secondary | ICD-10-CM | POA: Diagnosis not present

## 2019-12-04 DIAGNOSIS — H43813 Vitreous degeneration, bilateral: Secondary | ICD-10-CM | POA: Diagnosis not present

## 2019-12-04 DIAGNOSIS — H353211 Exudative age-related macular degeneration, right eye, with active choroidal neovascularization: Secondary | ICD-10-CM | POA: Diagnosis not present

## 2019-12-05 DIAGNOSIS — H353211 Exudative age-related macular degeneration, right eye, with active choroidal neovascularization: Secondary | ICD-10-CM | POA: Diagnosis not present

## 2019-12-07 ENCOUNTER — Other Ambulatory Visit: Payer: Self-pay | Admitting: Internal Medicine

## 2019-12-11 ENCOUNTER — Other Ambulatory Visit: Payer: Self-pay | Admitting: Internal Medicine

## 2019-12-26 DIAGNOSIS — M25571 Pain in right ankle and joints of right foot: Secondary | ICD-10-CM | POA: Diagnosis not present

## 2019-12-26 DIAGNOSIS — R262 Difficulty in walking, not elsewhere classified: Secondary | ICD-10-CM | POA: Diagnosis not present

## 2020-01-02 DIAGNOSIS — H353122 Nonexudative age-related macular degeneration, left eye, intermediate dry stage: Secondary | ICD-10-CM | POA: Diagnosis not present

## 2020-01-02 DIAGNOSIS — H353211 Exudative age-related macular degeneration, right eye, with active choroidal neovascularization: Secondary | ICD-10-CM | POA: Diagnosis not present

## 2020-01-03 ENCOUNTER — Ambulatory Visit: Payer: Medicare Other | Admitting: Family Medicine

## 2020-01-03 ENCOUNTER — Other Ambulatory Visit: Payer: Self-pay | Admitting: Internal Medicine

## 2020-01-04 DIAGNOSIS — M1990 Unspecified osteoarthritis, unspecified site: Secondary | ICD-10-CM | POA: Diagnosis not present

## 2020-01-04 DIAGNOSIS — M5136 Other intervertebral disc degeneration, lumbar region: Secondary | ICD-10-CM | POA: Diagnosis not present

## 2020-01-04 DIAGNOSIS — Z79899 Other long term (current) drug therapy: Secondary | ICD-10-CM | POA: Diagnosis not present

## 2020-01-04 DIAGNOSIS — L4050 Arthropathic psoriasis, unspecified: Secondary | ICD-10-CM | POA: Diagnosis not present

## 2020-01-09 ENCOUNTER — Other Ambulatory Visit: Payer: Self-pay

## 2020-01-09 ENCOUNTER — Ambulatory Visit (INDEPENDENT_AMBULATORY_CARE_PROVIDER_SITE_OTHER): Payer: Medicare Other

## 2020-01-09 VITALS — BP 110/72 | HR 76 | Temp 98.3°F | Resp 20 | Wt 168.7 lb

## 2020-01-09 DIAGNOSIS — Z0000 Encounter for general adult medical examination without abnormal findings: Secondary | ICD-10-CM | POA: Diagnosis not present

## 2020-01-09 NOTE — Patient Instructions (Addendum)
Michelle [name] , Thank you for taking time to come for your Medicare Wellness Visit. I appreciate your ongoing commitment to your health goals. Please review the following plan we discussed and let me know if I can assist you in the future.   Screening recommendations/referrals: Colonoscopy: Done 12/02/15 Mammogram: Done 05/16/19 Bone Density: Pt stated completed  Recommended yearly ophthalmology/optometry visit for glaucoma screening and checkup Recommended yearly dental visit for hygiene and checkup  Vaccinations: Influenza vaccine: Done 11/29/19 Up to date Pneumococcal vaccine: Up to date Tdap vaccine: Due and discussed Shingles vaccine: Shingrix discussed. Please contact your pharmacy for coverage information.    Covid-19:Completed 1/21, 2/11, and 11/18/19  Advanced directives: Please bring a copy of your health care power of attorney and living will to the office at your convenience.  Conditions/risks identified: lose weight   Next appointment: Follow up in one year for your annual wellness visit    Preventive Care 65 Years and Older, Female Preventive care refers to lifestyle choices and visits with your health care provider that can promote health and wellness. What does preventive care include?  A yearly physical exam. This is also called an annual well check.  Dental exams once or twice a year.  Routine eye exams. Ask your health care provider how often you should have your eyes checked.  Personal lifestyle choices, including:  Daily care of your teeth and gums.  Regular physical activity.  Eating a healthy diet.  Avoiding tobacco and drug use.  Limiting alcohol use.  Practicing safe sex.  Taking low-dose aspirin every day.  Taking vitamin and mineral supplements as recommended by your health care provider. What happens during an annual well check? The services and screenings done by your health care provider during your annual well check will depend on your age,  overall health, lifestyle risk factors, and family history of disease. Counseling  Your health care provider may ask you questions about your:  Alcohol use.  Tobacco use.  Drug use.  Emotional well-being.  Home and relationship well-being.  Sexual activity.  Eating habits.  History of falls.  Memory and ability to understand (cognition).  Work and work Statistician.  Reproductive health. Screening  You may have the following tests or measurements:  Height, weight, and BMI.  Blood pressure.  Lipid and cholesterol levels. These may be checked every 5 years, or more frequently if you are over 54 years old.  Skin check.  Lung cancer screening. You may have this screening every year starting at age 65 if you have a 30-pack-year history of smoking and currently smoke or have quit within the past 15 years.  Fecal occult blood test (FOBT) of the stool. You may have this test every year starting at age 35.  Flexible sigmoidoscopy or colonoscopy. You may have a sigmoidoscopy every 5 years or a colonoscopy every 10 years starting at age 33.  Hepatitis C blood test.  Hepatitis B blood test.  Sexually transmitted disease (STD) testing.  Diabetes screening. This is done by checking your blood sugar (glucose) after you have not eaten for a while (fasting). You may have this done every 1-3 years.  Bone density scan. This is done to screen for osteoporosis. You may have this done starting at age 72.  Mammogram. This may be done every 1-2 years. Talk to your health care provider about how often you should have regular mammograms. Talk with your health care provider about your test results, treatment options, and if necessary, the need  for more tests. Vaccines  Your health care provider may recommend certain vaccines, such as:  Influenza vaccine. This is recommended every year.  Tetanus, diphtheria, and acellular pertussis (Tdap, Td) vaccine. You may need a Td booster every 10  years.  Zoster vaccine. You may need this after age 31.  Pneumococcal 13-valent conjugate (PCV13) vaccine. One dose is recommended after age 84.  Pneumococcal polysaccharide (PPSV23) vaccine. One dose is recommended after age 53. Talk to your health care provider about which screenings and vaccines you need and how often you need them. This information is not intended to replace advice given to you by your health care provider. Make sure you discuss any questions you have with your health care provider. Document Released: 02/15/2015 Document Revised: 10/09/2015 Document Reviewed: 11/20/2014 Elsevier Interactive Patient Education  2017 [place] Prevention in the Home Falls can cause injuries. They can happen to people of all ages. There are many things you can do to make your home safe and to help prevent falls. What can I do on the outside of my home?  Regularly fix the edges of walkways and driveways and fix any cracks.  Remove anything that might make you trip as you walk through a door, such as a raised step or threshold.  Trim any bushes or trees on the path to your home.  Use bright outdoor lighting.  Clear any walking paths of anything that might make someone trip, such as rocks or tools.  Regularly check to see if handrails are loose or broken. Make sure that both sides of any steps have handrails.  Any raised decks and porches should have guardrails on the edges.  Have any leaves, snow, or ice cleared regularly.  Use sand or salt on walking paths during winter.  Clean up any spills in your garage right away. This includes oil or grease spills. What can I do in the bathroom?  Use night lights.  Install grab bars by the toilet and in the tub and shower. Do not use towel bars as grab bars.  Use non-skid mats or decals in the tub or shower.  If you need to sit down in the shower, use a plastic, non-slip stool.  Keep the floor dry. Clean up any water that  spills on the floor as soon as it happens.  Remove soap buildup in the tub or shower regularly.  Attach bath mats securely with double-sided non-slip rug tape.  Do not have throw rugs and other things on the floor that can make you trip. What can I do in the bedroom?  Use night lights.  Make sure that you have a light by your bed that is easy to reach.  Do not use any sheets or blankets that are too big for your bed. They should not hang down onto the floor.  Have a firm chair that has side arms. You can use this for support while you get dressed.  Do not have throw rugs and other things on the floor that can make you trip. What can I do in the kitchen?  Clean up any spills right away.  Avoid walking on wet floors.  Keep items that you use a lot in easy-to-reach places.  If you need to reach something above you, use a strong step stool that has a grab bar.  Keep electrical cords out of the way.  Do not use floor polish or wax that makes floors slippery. If you must use wax, use  non-skid floor wax.  Do not have throw rugs and other things on the floor that can make you trip. What can I do with my stairs?  Do not leave any items on the stairs.  Make sure that there are handrails on both sides of the stairs and use them. Fix handrails that are broken or loose. Make sure that handrails are as long as the stairways.  Check any carpeting to make sure that it is firmly attached to the stairs. Fix any carpet that is loose or worn.  Avoid having throw rugs at the top or bottom of the stairs. If you do have throw rugs, attach them to the floor with carpet tape.  Make sure that you have a light switch at the top of the stairs and the bottom of the stairs. If you do not have them, ask someone to add them for you. What else can I do to help prevent falls?  Wear shoes that:  Do not have high heels.  Have rubber bottoms.  Are comfortable and fit you well.  Are closed at the  toe. Do not wear sandals.  If you use a stepladder:  Make sure that it is fully opened. Do not climb a closed stepladder.  Make sure that both sides of the stepladder are locked into place.  Ask someone to hold it for you, if possible.  Clearly mark and make sure that you can see:  Any grab bars or handrails.  First and last steps.  Where the edge of each step is.  Use tools that help you move around (mobility aids) if they are needed. These include:  Canes.  Walkers.  Scooters.  Crutches.  Turn on the lights when you go into a dark area. Replace any light bulbs as soon as they burn out.  Set up your furniture so you have a clear path. Avoid moving your furniture around.  If any of your floors are uneven, fix them.  If there are any pets around you, be aware of where they are.  Review your medicines with your doctor. Some medicines can make you feel dizzy. This can increase your chance of falling. Ask your doctor what other things that you can do to help prevent falls. This information is not intended to replace advice given to you by your health care provider. Make sure you discuss any questions you have with your health care provider. Document Released: 11/15/2008 Document Revised: 06/27/2015 Document Reviewed: 02/23/2014 Elsevier Interactive Patient Education  2017 [employer].

## 2020-01-09 NOTE — Progress Notes (Addendum)
Subjective:   Michelle [name] is a 79 y.o. female who presents for Medicare Annual (Subsequent) preventive examination.  Review of Systems     Cardiac Risk Factors include: advanced age (>77men, >71 women);hypertension;obesity (BMI >30kg/m2)     Objective:    Today's Vitals   01/09/20 0924  BP: 110/72  Pulse: 76  Resp: 20  Temp: 98.3 F (36.8 C)  SpO2: 97%  Weight: 168 lb 11.2 oz (76.5 kg)   Body mass index is 30.86 kg/m.  Advanced Directives 01/09/2020 02/08/2012 02/08/2012 01/29/2012  Does Patient Have a Medical Advance Directive? Yes Patient has advance directive, copy in chart - Patient has advance directive, copy in chart  Type of Advance Directive Living will [place] of [place];Living will  Pre-existing out of facility DNR order (yellow form or pink MOST form) - No No -    Current Medications (verified) Outpatient Encounter Medications as of 01/09/2020  Medication Sig  . chlorthalidone (HYGROTON) 25 MG tablet TAKE 1/2 - 1 TABLET BY MOUTH EVERY DAY AS DIRECTED. PLEASE SCHEDULE AN OFFICE VISIT FOR FURTHER REFILLS. [phone]  . Cholecalciferol (VITAMIN D) 2000 UNITS CAPS Take 1 capsule by mouth daily.   . folic acid (FOLVITE) 1 MG tablet Take 1 mg by mouth daily.    Marland Kitchen losartan (COZAAR) 100 MG tablet TAKE 1 TABLET (100 MG TOTAL) BY MOUTH DAILY. PLEASE SCHEDULE OFFICE VISIT FOR FURTHER REFILLS. [phone]  . methotrexate (RHEUMATREX) 2.5 MG tablet Take 12.5 mg by mouth once a week. Caution:Chemotherapy. Protect from light. TAKES ON SAT  . metoprolol succinate (TOPROL-XL) 50 MG 24 hr tablet TAKE 1 TABLET (50 MG TOTAL) BY MOUTH DAILY. PLEASE SCHEDULE OFFICE VISIT FOR FURTHER REFILLS.  . Multiple Vitamin (MULTIVITAMIN) tablet Take 1 tablet by mouth daily.  . Multiple Vitamins-Minerals (PRESERVISION AREDS) TABS Take 1 tablet by mouth 2 (two) times daily.  . vitamin C (ASCORBIC ACID) 250 MG tablet Take 250 mg by mouth daily.  .  [DISCONTINUED] nitroGLYCERIN (NITRO-DUR) 0.2 mg/hr patch Apply 1/4 of a patch to skin once daily. (Patient not taking: Reported on 01/09/2020)  . [DISCONTINUED] predniSONE (DELTASONE) 20 MG tablet Take 1 tablet (20 mg total) by mouth daily with breakfast. (Patient not taking: Reported on 01/09/2020)   No facility-administered encounter medications on file as of 01/09/2020.    Allergies (verified) Patient has no known allergies.   History: Past Medical History:  Diagnosis Date  . 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 Hypertension   . Osteopenia    Dr.John McComb   Past Surgical History:  Procedure Laterality Date  . BACK SURGERY  09/2010;02/2012   DISC REMOVED AND SPACER/FUSION PLACED   . CATARACT EXTRACTION, BILATERAL    . COLONOSCOPY     negative  . TOTAL ABDOMINAL HYSTERECTOMY W/ BILATERAL SALPINGOOPHORECTOMY     For Fibroids  . TUBAL LIGATION     Family History  Problem Relation Age of Onset  . Kidney cancer Father        prostate   . Cancer Father   . Hypertension Mother        died at age 21   . Autoimmune disease Mother        Hemolytic Anemia  . Breast cancer Maternal Aunt   . Hypertension Maternal Aunt   . Stroke Maternal Grandfather 59  . Osteoporosis Other        Mother, 2 M aunts , MGM  . Atrial fibrillation Daughter 41  . Diabetes  Neg Hx   . Heart disease Neg Hx    Social History   Socioeconomic History  . Marital status: Widowed    Spouse name: Not on file  . Number of children: Not on file  . Years of education: Not on file  . Highest education level: Not on file  Occupational History  . Occupation: Retired  Tobacco Use  . Smoking status: Never Smoker  . Smokeless tobacco: Never Used  Vaping Use  . Vaping Use: Never used  Substance and Sexual Activity  . Alcohol use: No  . Drug use: No  . Sexual activity: Not on file  Other Topics Concern  . Not on file  Social History Narrative   9 hours of sleep per night   Single living in the home  (widow)   No pets   Retired   Children 3  . 2 local 1 in [place] and  then [place] back to [employer] with exercise.   Childbirth x3   Social Determinants of Health   Financial Resource Strain: Low Risk   . Difficulty of Paying Living Expenses: Not hard at all  Food Insecurity: No Food Insecurity  . Worried About Charity fundraiser in the Last Year: Never true  . Ran Out of Food in the Last Year: Never true  Transportation Needs: No Transportation Needs  . Lack of Transportation (Medical): No  . Lack of Transportation (Non-Medical): No  Physical Activity: Sufficiently Active  . Days of Exercise per Week: 5 days  . Minutes of Exercise per Session: 60 min  Stress: No Stress Concern Present  . Feeling of Stress : Not at all  Social Connections: Moderately Integrated  . Frequency of Communication with Friends and Family: More than three times a week  . Frequency of Social Gatherings with Friends and Family: Three times a week  . Attends Religious Services: More than 4 times per year  . Active Member of Clubs or Organizations: Yes  . Attends Archivist Meetings: 1 to 4 times per year  . Marital Status: Widowed    Tobacco Counseling Counseling given: Not Answered   Clinical Intake:  Pre-visit preparation completed: Yes  Pain : No/denies pain     BMI - recorded: 30.86 Nutritional Status: BMI > 30  Obese Nutritional Risks: None Diabetes: No  How often do you need to have someone help you when you read instructions, pamphlets, or other written materials from your doctor or pharmacy?: 1 - Never  Diabetic?No  Interpreter Needed?: No  Information entered by :: Charlott Rakes, LPN   Activities of Daily Living In your present state of health, do you have any difficulty performing the following activities: 01/09/2020  Hearing? N  Vision? N  Difficulty concentrating or making decisions? N  Walking or climbing stairs? N    Dressing or bathing? N  Doing errands, shopping? N  Preparing Food and eating ? N  Using the Toilet? N  In the past six months, have you accidently leaked urine? Y  Comment at times at night and wears pads at night  Do you have problems with loss of bowel control? N  Managing your Medications? N  Managing your Finances? N  Housekeeping or managing your Housekeeping? N  Some recent data might be hidden    Patient Care Team: Panosh, Standley Brooking, MD as PCP - General (Internal Medicine) Arvella Nigh, MD as Consulting Physician (  Obstetrics and Gynecology) Newman Pies, MD as Consulting Physician (Neurosurgery) Valinda Party, MD (Rheumatology) Sharyne Peach, MD as Consulting Physician (Ophthalmology)  Indicate any recent Medical Services you may have received from other than [facility] providers in the past year (date may be approximate).     Assessment:   This is a routine wellness examination for [place].  Hearing/Vision screen  Hearing Screening   125Hz  250Hz  500Hz  1000Hz  2000Hz  3000Hz  4000Hz  6000Hz  8000Hz   Right ear:           Left ear:           Comments: Pt denies hearing issues  Vision Screening Comments: Follows up with Dr Delman Cheadle annually and follows a retina Specialists as well  Dietary issues and exercise activities discussed: Current Exercise Habits: Structured exercise class;Home exercise routine, Type of exercise: walking;Other - see comments, Time (Minutes): > 60, Frequency (Times/Week): 5, Weekly Exercise (Minutes/Week): 0  Goals    . Patient Stated     Lose weight       Depression Screen PHQ 2/9 Scores 01/09/2020 11/08/2018 04/28/2017 11/20/2015 11/13/2014 04/25/2013  PHQ - 2 Score 0 0 0 0 0 0    Fall Risk Fall Risk  01/09/2020 11/08/2018 02/17/2018 04/28/2017 11/20/2015  Falls in the past year? 0 0 0 No No  Number falls in past yr: 0 0 0 - -  Injury with Fall? 0 0 0 - -  Risk for fall due to : Impaired vision - - - -  Follow up Falls prevention discussed - - - -     FALL RISK PREVENTION PERTAINING TO THE HOME:  Any stairs in or around the home? Yes  If so, are there any without handrails? No  Home free of loose throw rugs in walkways, pet beds, electrical cords, etc? Yes  Adequate lighting in your home to reduce risk of falls? Yes   ASSISTIVE DEVICES UTILIZED TO PREVENT FALLS:  Life alert? No  Use of a cane, walker or w/c? No  Grab bars in the bathroom? No  Shower chair or bench in shower? No  Elevated toilet seat or a handicapped toilet? No   TIMED UP AND GO:  Was the test performed? Yes .  Length of time to ambulate 10 feet: 10  sec.   Gait steady and fast without use of assistive device  Cognitive Function:     6CIT Screen 01/09/2020  What Year? 0 points  What month? 0 points  Count back from 20 0 points  Months in reverse 0 points  Repeat phrase 0 points    Immunizations Immunization History  Administered Date(s) Administered  . Fluad Quad(high Dose 65+) 11/08/2018, 11/29/2019  . Influenza Split 12/03/2012  . Influenza Whole 12/24/2004, 10/19/2007  . Influenza, High Dose Seasonal PF 12/19/2013, 11/07/2014, 11/20/2015, 12/01/2016  . Influenza,inj,Quad PF,6+ Mos 12/03/2017  . PFIZER SARS-COV-2 Vaccination 02/23/2019, 03/16/2019, 11/18/2019  . Pneumococcal Conjugate-13 11/13/2014  . Pneumococcal Polysaccharide-23 11/25/2009  . Td 02/04/2006    TDAP status: Due, Education has been provided regarding the importance of this vaccine. Advised may receive this vaccine at local pharmacy or Health Dept. Aware to provide a copy of the vaccination record if obtained from local pharmacy or Health Dept. Verbalized acceptance and understanding.  Flu Vaccine status: Up to date  Pneumococcal vaccine status: Up to date  Covid-19 vaccine status: Completed vaccines  Qualifies for Shingles Vaccine? Yes   Zostavax completed No   Shingrix Completed?: No.    Education has been provided regarding  the importance of this vaccine. Patient  has been advised to call insurance company to determine out of pocket expense if they have not yet received this vaccine. Advised may also receive vaccine at local pharmacy or Health Dept. Verbalized acceptance and understanding.  Screening Tests Health Maintenance  Topic Date Due  . TETANUS/TDAP  01/08/2021 (Originally 02/05/2016)  . Hepatitis C Screening  01/08/2021 (Originally 06-23-1940)  . INFLUENZA VACCINE  Completed  . DEXA SCAN  Completed  . COVID-19 Vaccine  Completed  . PNA vac Low Risk Adult  Completed    Health Maintenance  There are no preventive care reminders to display for this patient.  Colorectal cancer screening: No longer required.   Mammogram status: Completed 05/16/19. Repeat every year  Bone density pr states done a few years ago and Dr Radene Knee follows her    Additional Screening:  Hepatitis C Screening: does qualify  Vision Screening: Recommended annual ophthalmology exams for early detection of glaucoma and other disorders of the eye. Is the patient up to date with their annual eye exam?  Yes  Who is the provider or what is the name of the office in which the patient attends annual eye exams? Dr Danella Sensing and retina specialist  Dental Screening: Recommended annual dental exams for proper oral hygiene  Community Resource Referral / Chronic Care Management: CRR required this visit?  No   CCM required this visit?  No      Plan:     I have personally reviewed and noted the following in the patient's chart:   . Medical and social history . Use of alcohol, tobacco or illicit drugs  . Current medications and supplements . Functional ability and status . Nutritional status . Physical activity . Advanced directives . List of other physicians . Hospitalizations, surgeries, and ER visits in previous 12 months . Vitals . Screenings to include cognitive, depression, and falls . Referrals and appointments  In addition, I have reviewed and discussed with  patient certain preventive protocols, quality metrics, and best practice recommendations. A written personalized care plan for preventive services as well as general preventive health recommendations were provided to patient.     Willette Brace, LPN   29/10/3714   Nurse Notes: Pt states she needed an office visit and was wondering if this appt could get her refills on medications needed or will she need to come in for a physical this year please advise

## 2020-01-11 LAB — CBC AND DIFFERENTIAL
HCT: 40 (ref 36–46)
Hemoglobin: 13.7 (ref 12.0–16.0)
Platelets: 356 (ref 150–399)
WBC: 12.1

## 2020-01-11 LAB — HEPATIC FUNCTION PANEL
ALT: 25 (ref 7–35)
AST: 20 (ref 13–35)
Bilirubin, Total: 0.4

## 2020-01-11 LAB — COMPREHENSIVE METABOLIC PANEL
Albumin: 3.7 (ref 3.5–5.0)
Calcium: 9.6 (ref 8.7–10.7)
Globulin: 3.5

## 2020-01-11 LAB — CBC: RBC: 4.52 (ref 3.87–5.11)

## 2020-01-11 LAB — BASIC METABOLIC PANEL
BUN: 15 (ref 4–21)
CO2: 30 — AB (ref 13–22)
Chloride: 101 (ref 99–108)
Creatinine: 0.7 (ref <=1.1)
Glucose: 117
Potassium: 3.9 (ref 3.4–5.3)
Sodium: 142 (ref 137–147)

## 2020-01-25 ENCOUNTER — Ambulatory Visit: Payer: Self-pay

## 2020-01-25 ENCOUNTER — Encounter: Payer: Self-pay | Admitting: Family Medicine

## 2020-01-25 ENCOUNTER — Other Ambulatory Visit: Payer: Self-pay

## 2020-01-25 ENCOUNTER — Ambulatory Visit (INDEPENDENT_AMBULATORY_CARE_PROVIDER_SITE_OTHER): Payer: Medicare Other | Admitting: Family Medicine

## 2020-01-25 VITALS — BP 118/90 | HR 68 | Ht 62.0 in | Wt 170.0 lb

## 2020-01-25 DIAGNOSIS — S86011D Strain of right Achilles tendon, subsequent encounter: Secondary | ICD-10-CM | POA: Diagnosis not present

## 2020-01-25 DIAGNOSIS — G8929 Other chronic pain: Secondary | ICD-10-CM

## 2020-01-25 DIAGNOSIS — M25571 Pain in right ankle and joints of right foot: Secondary | ICD-10-CM | POA: Diagnosis not present

## 2020-01-25 NOTE — Assessment & Plan Note (Signed)
Patient has done remarkably better.  Having no pain whatsoever.  Patient has been able to increase her walking and is not having any difficulty.  We would have ultrasounded but no concern at this time.  Patient knows if worsening symptoms I would like to see her again but otherwise can follow-up as needed

## 2020-01-25 NOTE — Patient Instructions (Signed)
2000-4000 IUs of vitamin D daily See me when you need me  Thank you for working so hard

## 2020-01-25 NOTE — Progress Notes (Signed)
[place] Phone: [phone] Subjective:   Michelle [name], am serving as a scribe for Dr. Hulan Saas. This visit occurred during the SARS-CoV-2 public health emergency.  Safety protocols were in place, including screening questions prior to the visit, additional usage of staff PPE, and extensive cleaning of exam room while observing appropriate contact time as indicated for disinfecting solutions.   I'm seeing this patient by the request  of:  Panosh, Standley Brooking, MD  CC: Right ankle follow-up  IEP:[name]   9/29/20201 Patient has made great strides but still has significant amount of calcific deposits in the Achilles itself as well as with increasing hypoechoic changes.  Discussed with patient about the possibility of PRP.  Handout given today.  Patient should do relatively well overall and wants to continue with physical therapy for the iontophoresis.  We discussed the calcific could be secondary to the psoriatic arthritis.  Patient wants to avoid [name] any injectable or infusions at this time.  Has discussed with the rheumatologist.  Patient wants to get another 6 to 8 weeks.  We discussed proper shoes and a heel lift that I think will be beneficial if she starts increasing her activity and we will see how patient responds.  Total time with patient as well as reviewing patient's chart, imaging including MRI on the date of service 36 minutes  Update 01/25/2020 Michelle [name] is a 79 y.o. female coming in with complaint of right ankle pain. Patient is [name] longer having pain in achilles. Did physical therapy and states that this process helped her to fully heal.  Patient states that she is 100% better at this time.  [name] significant erythema    Past Medical History:  Diagnosis Date  . 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 Hypertension   . Osteopenia    Dr.John McComb   Past Surgical History:  Procedure Laterality Date   . BACK SURGERY  09/2010;02/2012   DISC REMOVED AND SPACER/FUSION PLACED   . CATARACT EXTRACTION, BILATERAL    . COLONOSCOPY     negative  . TOTAL ABDOMINAL HYSTERECTOMY W/ BILATERAL SALPINGOOPHORECTOMY     For Fibroids  . TUBAL LIGATION     Social History   Socioeconomic History  . Marital status: Widowed    Spouse name: Not on file  . Number of children: Not on file  . Years of education: Not on file  . Highest education level: Not on file  Occupational History  . Occupation: Retired  Tobacco Use  . Smoking status: Never Smoker  . Smokeless tobacco: Never Used  Vaping Use  . Vaping Use: Never used  Substance and Sexual Activity  . Alcohol use: [name]  . Drug use: [name]  . Sexual activity: Not on file  Other Topics Concern  . Not on file  Social History Narrative   9 hours of sleep per night   Single living in the home (widow)   [name] pets   Retired   Children 3  . 2 local 1 in [place] and  then [place] back to [employer] with exercise.   Childbirth x3   Social Determinants of Health   Financial Resource Strain: Low Risk   . Difficulty of Paying Living Expenses: Not hard at all  Food Insecurity: [name] Food Insecurity  . Worried About Charity fundraiser in the Last Year:  Never true  . Ran Out of Food in the Last Year: Never true  Transportation Needs: [name] Transportation Needs  . Lack of Transportation (Medical): [name]  . Lack of Transportation (Non-Medical): [name]  Physical Activity: Sufficiently Active  . Days of Exercise per Week: 5 days  . Minutes of Exercise per Session: 60 min  Stress: [name] Stress Concern Present  . Feeling of Stress : Not at all  Social Connections: Moderately Integrated  . Frequency of Communication with Friends and Family: More than three times a week  . Frequency of Social Gatherings with Friends and Family: Three times a week  . Attends Religious Services: More than 4 times per year  . Active Member of Clubs or  Organizations: Yes  . Attends Archivist Meetings: 1 to 4 times per year  . Marital Status: Widowed   [name] Known Allergies Family History  Problem Relation Age of Onset  . Kidney cancer Father        prostate   . Cancer Father   . Hypertension Mother        died at age 80   . Autoimmune disease Mother        Hemolytic Anemia  . Breast cancer Maternal Aunt   . Hypertension Maternal Aunt   . Stroke Maternal Grandfather 41  . Osteoporosis Other        Mother, 2 M aunts , MGM  . Atrial fibrillation Daughter 47  . Diabetes Neg Hx   . Heart disease Neg Hx      Current Outpatient Medications (Cardiovascular):  .  chlorthalidone (HYGROTON) 25 MG tablet, TAKE 1/2 - 1 TABLET BY MOUTH EVERY DAY AS DIRECTED. PLEASE SCHEDULE AN OFFICE VISIT FOR FURTHER REFILLS. [phone] .  losartan (COZAAR) 100 MG tablet, TAKE 1 TABLET (100 MG TOTAL) BY MOUTH DAILY. PLEASE SCHEDULE OFFICE VISIT FOR FURTHER REFILLS. [phone] .  metoprolol succinate (TOPROL-XL) 50 MG 24 hr tablet, TAKE 1 TABLET (50 MG TOTAL) BY MOUTH DAILY. PLEASE SCHEDULE OFFICE VISIT FOR FURTHER REFILLS.    Current Outpatient Medications (Hematological):  .  folic acid (FOLVITE) 1 MG tablet, Take 1 mg by mouth daily.  Current Outpatient Medications (Other):  Marland Kitchen  Cholecalciferol (VITAMIN D) 2000 UNITS CAPS, Take 1 capsule by mouth daily.  .  methotrexate (RHEUMATREX) 2.5 MG tablet, Take 12.5 mg by mouth once a week. Caution:Chemotherapy. Protect from light. TAKES ON SAT .  Multiple Vitamin (MULTIVITAMIN) tablet, Take 1 tablet by mouth daily. .  Multiple Vitamins-Minerals (PRESERVISION AREDS) TABS, Take 1 tablet by mouth 2 (two) times daily. .  vitamin C (ASCORBIC ACID) 250 MG tablet, Take 250 mg by mouth daily.   Reviewed prior external information including notes and imaging from  primary care provider As well as notes that were available from care everywhere and other healthcare systems.  Past medical history,  social, surgical and family history all reviewed in electronic medical record.  [name] pertanent information unless stated regarding to the chief complaint.   Review of Systems:  [name] headache, visual changes, nausea, vomiting, diarrhea, constipation, dizziness, abdominal pain, skin rash, fevers, chills, night sweats, weight loss, swollen lymph nodes, body aches, joint swelling, chest pain, shortness of breath, mood changes. POSITIVE muscle aches  Objective  Blood pressure 118/90, pulse 68, height 5\' 2"  (1.575 m), weight 170 lb (77.1 kg), last menstrual period 04/19/1997, SpO2 97 %.   General: [name] apparent distress alert and oriented x3 mood and affect normal, dressed appropriately.  HEENT: Pupils equal, extraocular  movements intact  Respiratory: Patient's speak in full sentences and does not appear short of breath  Cardiovascular: [name] lower extremity edema, non tender, [name] erythema  Able to ambulate without any pain MSK: Right ankle exam shows patient has full strength of the Achilles.  [name] significant erythema.  [name] swelling noted.  Patient neurovascularly intact distally.    Impression and Recommendations:     The above documentation has been reviewed and is accurate and complete Judi Saa, DO

## 2020-01-30 DIAGNOSIS — H353211 Exudative age-related macular degeneration, right eye, with active choroidal neovascularization: Secondary | ICD-10-CM | POA: Diagnosis not present

## 2020-01-30 DIAGNOSIS — H353122 Nonexudative age-related macular degeneration, left eye, intermediate dry stage: Secondary | ICD-10-CM | POA: Diagnosis not present

## 2020-02-05 ENCOUNTER — Other Ambulatory Visit: Payer: Self-pay | Admitting: Internal Medicine

## 2020-02-13 DIAGNOSIS — E041 Nontoxic single thyroid nodule: Secondary | ICD-10-CM | POA: Diagnosis not present

## 2020-02-14 ENCOUNTER — Other Ambulatory Visit: Payer: Self-pay | Admitting: Endocrinology

## 2020-02-14 DIAGNOSIS — E041 Nontoxic single thyroid nodule: Secondary | ICD-10-CM

## 2020-02-19 ENCOUNTER — Ambulatory Visit: Payer: Medicare Other | Admitting: Internal Medicine

## 2020-02-21 ENCOUNTER — Telehealth (INDEPENDENT_AMBULATORY_CARE_PROVIDER_SITE_OTHER): Payer: Medicare Other | Admitting: Internal Medicine

## 2020-02-21 ENCOUNTER — Encounter: Payer: Self-pay | Admitting: Internal Medicine

## 2020-02-21 DIAGNOSIS — Z79899 Other long term (current) drug therapy: Secondary | ICD-10-CM

## 2020-02-21 DIAGNOSIS — I10 Essential (primary) hypertension: Secondary | ICD-10-CM | POA: Diagnosis not present

## 2020-02-21 DIAGNOSIS — R197 Diarrhea, unspecified: Secondary | ICD-10-CM | POA: Diagnosis not present

## 2020-02-21 DIAGNOSIS — L4050 Arthropathic psoriasis, unspecified: Secondary | ICD-10-CM | POA: Diagnosis not present

## 2020-02-21 DIAGNOSIS — U071 COVID-19: Secondary | ICD-10-CM | POA: Diagnosis not present

## 2020-02-21 MED ORDER — METOPROLOL SUCCINATE ER 50 MG PO TB24: ORAL_TABLET | ORAL | 1 refills | Status: DC

## 2020-02-21 MED ORDER — LOSARTAN POTASSIUM 100 MG PO TABS: 100.0000 mg | ORAL_TABLET | Freq: Every day | ORAL | 1 refills | Status: DC

## 2020-02-21 MED ORDER — CHLORTHALIDONE 25 MG PO TABS: ORAL_TABLET | ORAL | 1 refills | Status: DC

## 2020-02-21 NOTE — Progress Notes (Signed)
Virtual Visit via Video Note  I connected with@ on 02/21/20 at 12:00 PM EST by a video enabled telemedicine application and verified that I am speaking with the correct person using two identifiers. Location patient: home Location provider:work  office Persons participating in the virtual visit: patient, provider  [place] national recommendations  regarding COVID 19 pandemic   video visit is advised over in office visit for this patient.  Patient aware  of the limitations of evaluation and management by telemedicine and  availability of in person appointments. and agreed to proceed. After multiple attempts patient able to unblock camera has iPhone and laptop cannot connect system continues to block. Transition to telephone visit.  HPI: Michelle [name] presents for video visit fo r med check  And now sx and pos covid testing   Last visit was  May 21 with me for  rle predicament  She is on metoprolol loasrtan and chlorthalidone  and here for med checl Bp readings have been  In range at specialty visits  Rheum and endocrine  Had utd lab work  X lipids  HM utd    Developed a sore throat that lasted only about 24 hours on Saturday, January 16 did a test rapid  positive feels a bit tired and washed out but no significant fever serious coughing or shortness of breath. There was a family gathering on January 9 and her son began to have a runny nose on January 10 got tested on the 11th positive for COVID.  For the last 2 weeks she has had some diarrhea off and on not as bad since she changed her diet not associated with fever blood serious abdominal pain or vomiting. The symptoms predated the above.  She has well water no one else is sick.  With diarrhea. She is on methotrexate had her lab work done on a regular basis and has been stable.  ROS: See pertinent positives and negatives per HPI. No cp sob    Past Medical History:  Diagnosis Date  . 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  Hypertension   . Osteopenia    Dr.John McComb    Past Surgical History:  Procedure Laterality Date  . BACK SURGERY  09/2010;02/2012   DISC REMOVED AND SPACER/FUSION PLACED   . CATARACT EXTRACTION, BILATERAL    . COLONOSCOPY     negative  . TOTAL ABDOMINAL HYSTERECTOMY W/ BILATERAL SALPINGOOPHORECTOMY     For Fibroids  . TUBAL LIGATION      Family History  Problem Relation Age of Onset  . Kidney cancer Father        prostate   . Cancer Father   . Hypertension Mother        died at age 46   . Autoimmune disease Mother        Hemolytic Anemia  . Breast cancer Maternal Aunt   . Hypertension Maternal Aunt   . Stroke Maternal Grandfather 74  . Osteoporosis Other        Mother, 2 M aunts , MGM  . Atrial fibrillation Daughter 29  . Diabetes Neg Hx   . Heart disease Neg Hx     Social History   Tobacco Use  . Smoking status: Never Smoker  . Smokeless tobacco: Never Used  Vaping Use  . Vaping Use: Never used  Substance Use Topics  . Alcohol use: No  . Drug use: No      Current Outpatient Medications:  .  Cholecalciferol (VITAMIN D) 2000  UNITS CAPS, Take 1 capsule by mouth daily. , Disp: , Rfl:  .  folic acid (FOLVITE) 1 MG tablet, Take 1 mg by mouth daily., Disp: , Rfl:  .  methotrexate (RHEUMATREX) 2.5 MG tablet, Take 12.5 mg by mouth once a week. Caution:Chemotherapy. Protect from light. TAKES ON SAT, Disp: , Rfl:  .  Multiple Vitamin (MULTIVITAMIN) tablet, Take 1 tablet by mouth daily., Disp: , Rfl:  .  Multiple Vitamins-Minerals (PRESERVISION AREDS) TABS, Take 1 tablet by mouth 2 (two) times daily., Disp: , Rfl:  .  vitamin C (ASCORBIC ACID) 250 MG tablet, Take 250 mg by mouth daily., Disp: , Rfl:  .  chlorthalidone (HYGROTON) 25 MG tablet, TAKE 1/2 - 1 TABLET BY MOUTH EVERY DAY AS DIRECTED., Disp: 90 tablet, Rfl: 1 .  losartan (COZAAR) 100 MG tablet, Take 1 tablet (100 mg total) by mouth daily., Disp: 90 tablet, Rfl: 1 .  metoprolol succinate (TOPROL-XL) 50 MG 24  hr tablet, TAKE 1 TABLET (50 MG TOTAL) BY MOUTH DAILY., Disp: 90 tablet, Rfl: 1  EXAM: BP Readings from Last 3 Encounters:  01/25/20 118/90  01/09/20 110/72  11/01/19 124/84    VITALS per patient if applicable:  GENERAL: alert, oriented, sounds  well and in no acute distress Unable to do visual because of technical difficulties with the care agility system.  Blocking her iPhone. Respirations sound normal. PSYCH/NEURO: pleasant and cooperative, no obvious depression or anxiety, speech and thought processing grossly intact Lab Results  Component Value Date   WBC 11.9 10/04/2019   HGB 13.5 10/04/2019   HCT 40 10/04/2019   PLT 393 10/04/2019   GLUCOSE 95 11/15/2018   CHOL 150 04/29/2017   TRIG 76.0 04/29/2017   HDL 60.30 04/29/2017   LDLCALC 75 04/29/2017   ALT 21 10/04/2019   AST 15 10/04/2019   NA 141 10/04/2019   K 5.0 10/04/2019   CL 102 10/04/2019   CREATININE 0.7 10/04/2019   BUN 17 10/04/2019   CO2 31 (A) 10/04/2019   TSH 1.12 11/13/2014   HGBA1C 5.6 11/15/2018   Record review lab work. ASSESSMENT AND PLAN:   Discussed the following assessment and plan:    ICD-10-CM   1. COVID-19  U07.1 Ambulatory referral for Covid Treatment  2. Medication management  Z79.899   3. Diarrhea, unspecified type  R19.7   4. Essential hypertension  I10   5. 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 High risk medication use  Z79.899 Ambulatory referral for Covid Treatment   Please have lab work sent to [place] when done. Counseled.  Diarrhea seems to have predated the probable COVID infection and may be a different cause based on history and context.  Most likely onset of symptoms Saturday, January 16 with a mild sore throat that only lasted a day.  Exposure to family member January 9 who developed a runny nose and then tested positive on January 11. Discussed options of referral for monoclonal antibody or other intervention assuming day 5 of  symptoms. She actually is doing pretty well but has risk of immunosuppressive agents and age. Discussed possibility of monoclonal antibody infusion therapeutics we will send referral.  Expectant management and discussion of plan and treatment with opportunity to ask questions and all were answered. The patient agreed with the plan and demonstrated an understanding of the instructions.  Blood pressure seems to be controlled at other clinicians monitoring lab work mostly up-to-date except for lipid panel plan R OV in  6 months in person have specialist send [place] copy of lab work Refill the 3 medicines that are controlling her blood pressure. Advised to call back or seek an in-person evaluation if worsening  or having  further concerns .  If diarrheal problem is continuingneed follow-up evaluation. Return for depending on how doing   fu sx plus   in person visit 6 mos med check . I provided  22 minutes of non-face-to-face time during this encounter.   Shanon Ace, MD

## 2020-03-05 DIAGNOSIS — H353211 Exudative age-related macular degeneration, right eye, with active choroidal neovascularization: Secondary | ICD-10-CM | POA: Diagnosis not present

## 2020-03-05 DIAGNOSIS — H43813 Vitreous degeneration, bilateral: Secondary | ICD-10-CM | POA: Diagnosis not present

## 2020-03-05 DIAGNOSIS — H353122 Nonexudative age-related macular degeneration, left eye, intermediate dry stage: Secondary | ICD-10-CM | POA: Diagnosis not present

## 2020-03-05 DIAGNOSIS — H35373 Puckering of macula, bilateral: Secondary | ICD-10-CM | POA: Diagnosis not present

## 2020-03-19 DIAGNOSIS — H353122 Nonexudative age-related macular degeneration, left eye, intermediate dry stage: Secondary | ICD-10-CM | POA: Diagnosis not present

## 2020-03-19 DIAGNOSIS — H353211 Exudative age-related macular degeneration, right eye, with active choroidal neovascularization: Secondary | ICD-10-CM | POA: Diagnosis not present

## 2020-03-26 ENCOUNTER — Ambulatory Visit
Admission: RE | Admit: 2020-03-26 | Discharge: 2020-03-26 | Disposition: A | Discharge status: Home / Self Care | Payer: Medicare Other | Source: Ambulatory Visit | Attending: Endocrinology | Admitting: Endocrinology

## 2020-03-26 DIAGNOSIS — E041 Nontoxic single thyroid nodule: Secondary | ICD-10-CM

## 2020-04-03 DIAGNOSIS — Z79899 Other long term (current) drug therapy: Secondary | ICD-10-CM | POA: Diagnosis not present

## 2020-04-03 DIAGNOSIS — L4050 Arthropathic psoriasis, unspecified: Secondary | ICD-10-CM | POA: Diagnosis not present

## 2020-04-03 DIAGNOSIS — E041 Nontoxic single thyroid nodule: Secondary | ICD-10-CM | POA: Diagnosis not present

## 2020-04-03 LAB — CBC: RBC: 4.12 (ref 3.87–5.11)

## 2020-04-03 LAB — CBC AND DIFFERENTIAL
HCT: 37 (ref 36–46)
Hemoglobin: 12.8 (ref 12.0–16.0)
Neutrophils Absolute: 60.1
Platelets: 349 (ref 150–399)
WBC: 12.9

## 2020-04-03 LAB — BASIC METABOLIC PANEL
BUN: 19 (ref 4–21)
CO2: 23 — AB (ref 13–22)
Chloride: 97 — AB (ref 99–108)
Creatinine: 0.7 (ref 0.5–1.1)
Glucose: 105
Potassium: 3.7 (ref 3.4–5.3)
Sodium: 137 (ref 137–147)

## 2020-04-03 LAB — COMPREHENSIVE METABOLIC PANEL
Albumin: 4 (ref 3.5–5.0)
Calcium: 10.4 (ref 8.7–10.7)
GFR calc non Af Amer: 89
Globulin: 2.6

## 2020-04-16 DIAGNOSIS — H353211 Exudative age-related macular degeneration, right eye, with active choroidal neovascularization: Secondary | ICD-10-CM | POA: Diagnosis not present

## 2020-04-16 DIAGNOSIS — H353122 Nonexudative age-related macular degeneration, left eye, intermediate dry stage: Secondary | ICD-10-CM | POA: Diagnosis not present

## 2020-05-01 DIAGNOSIS — M5136 Other intervertebral disc degeneration, lumbar region: Secondary | ICD-10-CM | POA: Diagnosis not present

## 2020-05-01 DIAGNOSIS — M1990 Unspecified osteoarthritis, unspecified site: Secondary | ICD-10-CM | POA: Diagnosis not present

## 2020-05-01 DIAGNOSIS — L4050 Arthropathic psoriasis, unspecified: Secondary | ICD-10-CM | POA: Diagnosis not present

## 2020-05-01 DIAGNOSIS — Z79899 Other long term (current) drug therapy: Secondary | ICD-10-CM | POA: Diagnosis not present

## 2020-05-14 DIAGNOSIS — H353122 Nonexudative age-related macular degeneration, left eye, intermediate dry stage: Secondary | ICD-10-CM | POA: Diagnosis not present

## 2020-05-14 DIAGNOSIS — H353211 Exudative age-related macular degeneration, right eye, with active choroidal neovascularization: Secondary | ICD-10-CM | POA: Diagnosis not present

## 2020-05-21 DIAGNOSIS — Z1231 Encounter for screening mammogram for malignant neoplasm of breast: Secondary | ICD-10-CM | POA: Diagnosis not present

## 2020-06-05 DIAGNOSIS — Z01419 Encounter for gynecological examination (general) (routine) without abnormal findings: Secondary | ICD-10-CM | POA: Diagnosis not present

## 2020-06-05 DIAGNOSIS — Z6830 Body mass index (BMI) 30.0-30.9, adult: Secondary | ICD-10-CM | POA: Diagnosis not present

## 2020-06-11 DIAGNOSIS — H35373 Puckering of macula, bilateral: Secondary | ICD-10-CM | POA: Diagnosis not present

## 2020-06-11 DIAGNOSIS — H353211 Exudative age-related macular degeneration, right eye, with active choroidal neovascularization: Secondary | ICD-10-CM | POA: Diagnosis not present

## 2020-06-11 DIAGNOSIS — H43813 Vitreous degeneration, bilateral: Secondary | ICD-10-CM | POA: Diagnosis not present

## 2020-06-11 DIAGNOSIS — H353122 Nonexudative age-related macular degeneration, left eye, intermediate dry stage: Secondary | ICD-10-CM | POA: Diagnosis not present

## 2020-06-24 DIAGNOSIS — H353122 Nonexudative age-related macular degeneration, left eye, intermediate dry stage: Secondary | ICD-10-CM | POA: Diagnosis not present

## 2020-06-24 DIAGNOSIS — H353211 Exudative age-related macular degeneration, right eye, with active choroidal neovascularization: Secondary | ICD-10-CM | POA: Diagnosis not present

## 2020-07-03 DIAGNOSIS — M1990 Unspecified osteoarthritis, unspecified site: Secondary | ICD-10-CM | POA: Diagnosis not present

## 2020-07-03 DIAGNOSIS — Z79899 Other long term (current) drug therapy: Secondary | ICD-10-CM | POA: Diagnosis not present

## 2020-07-03 DIAGNOSIS — L4050 Arthropathic psoriasis, unspecified: Secondary | ICD-10-CM | POA: Diagnosis not present

## 2020-07-03 DIAGNOSIS — M5136 Other intervertebral disc degeneration, lumbar region: Secondary | ICD-10-CM | POA: Diagnosis not present

## 2020-07-03 LAB — BASIC METABOLIC PANEL
BUN: 19 (ref 4–21)
CO2: 23 — AB (ref 13–22)
Chloride: 97 — AB (ref 99–108)
Creatinine: 0.7 (ref 0.5–1.1)
Glucose: 105
Potassium: 3.7 (ref 3.4–5.3)
Sodium: 137 (ref 137–147)

## 2020-07-03 LAB — COMPREHENSIVE METABOLIC PANEL
Albumin: 4 (ref 3.5–5.0)
Calcium: 10.4 (ref 8.7–10.7)
GFR calc non Af Amer: 89
Globulin: 2.6

## 2020-07-03 LAB — CBC AND DIFFERENTIAL
HCT: 37 (ref 36–46)
Hemoglobin: 12.8 (ref 12.0–16.0)
Neutrophils Absolute: 60.1
Platelets: 349 (ref 150–399)
WBC: 12.9

## 2020-07-03 LAB — CBC: RBC: 4.12 (ref 3.87–5.11)

## 2020-07-08 ENCOUNTER — Encounter: Payer: Self-pay | Admitting: Internal Medicine

## 2020-07-22 DIAGNOSIS — H353122 Nonexudative age-related macular degeneration, left eye, intermediate dry stage: Secondary | ICD-10-CM | POA: Diagnosis not present

## 2020-07-22 DIAGNOSIS — H353211 Exudative age-related macular degeneration, right eye, with active choroidal neovascularization: Secondary | ICD-10-CM | POA: Diagnosis not present

## 2020-08-07 ENCOUNTER — Other Ambulatory Visit: Payer: Self-pay | Admitting: Internal Medicine

## 2020-08-19 DIAGNOSIS — H353211 Exudative age-related macular degeneration, right eye, with active choroidal neovascularization: Secondary | ICD-10-CM | POA: Diagnosis not present

## 2020-08-19 DIAGNOSIS — H353122 Nonexudative age-related macular degeneration, left eye, intermediate dry stage: Secondary | ICD-10-CM | POA: Diagnosis not present

## 2020-08-20 NOTE — Progress Notes (Signed)
Chief Complaint  Patient presents with   Follow-up     HPI: Michelle [name] 80 y.o. 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 ( Fam hx ) Venous access  issues for  infusion a problem. Has seen dr Chalmers Cater for thyroid nodule   neg bx.  BP:   has been ok  controlled  on current meds  metoprolol , chorthalidone ,losartan  Exercise  3 x per week . Gym  dec aching with   Arthritis  psoriatic  :  MTX   Sewing  when had achilles  tear   problem with pedal.  Now better   ROS: See pertinent positives and negatives per HPI.  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   Hypertension    Osteopenia    Dr.John McComb    Family History  Problem Relation Age of Onset   Kidney cancer Father        prostate    Cancer Father    Hypertension Mother        died at age 53    Autoimmune disease Mother        Hemolytic Anemia   Breast cancer Maternal Aunt    Hypertension Maternal Aunt    Stroke Maternal Grandfather 53   Osteoporosis Other        Mother, 2 M aunts , MGM   Atrial fibrillation Daughter 84   Diabetes Neg Hx    Heart disease Neg Hx     Social History   Socioeconomic History   Marital status: Widowed    Spouse name: Not on file   Number of children: Not on file   Years of education: Not on file   Highest education level: Not on file  Occupational History   Occupation: Retired  Tobacco Use   Smoking status: Never   Smokeless tobacco: Never  Vaping Use   Vaping Use: Never used  Substance and Sexual Activity   Alcohol use: No   Drug use: No   Sexual activity: Not on file  Other Topics Concern   Not on file  Social History Narrative   9 hours of sleep per night   Single living in the home (widow)   No pets   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   Childbirth x3   Social Determinants of Health   Financial Resource Strain: Low Risk    Difficulty of Paying Living Expenses: Not hard at all  Food Insecurity: No Food Insecurity   Worried About Charity fundraiser in the Last Year: Never true   Ran Out of Food in the Last Year: Never true  Transportation Needs: No Transportation Needs   Lack of Transportation (Medical): No   Lack of Transportation (Non-Medical): No  Physical Activity: Sufficiently Active   Days of Exercise per Week: 5 days   Minutes of Exercise per Session: 60 min  Stress: No Stress Concern Present   Feeling of Stress : Not at all  Social Connections: Moderately Integrated   Frequency of Communication with Friends and Family: More than three times  a week   Frequency of Social Gatherings with Friends and Family: Three times a week   Attends Religious Services: More than 4 times per year   Active Member of Clubs or Organizations: Yes   Attends Archivist Meetings: 1 to 4 times per year   Marital Status: Widowed    Outpatient Medications Prior to Visit  Medication Sig Dispense Refill   chlorthalidone (HYGROTON) 25 MG tablet TAKE 1/2 - 1 TABLET BY MOUTH EVERY DAY AS DIRECTED. 90 tablet 0   Cholecalciferol (VITAMIN D) 2000 UNITS CAPS Take 1 capsule by mouth daily.      folic acid (FOLVITE) 1 MG tablet Take 1 mg by mouth daily.     losartan (COZAAR) 100 MG tablet TAKE 1 TABLET BY MOUTH EVERY DAY 90 tablet 0   methotrexate (RHEUMATREX) 2.5 MG tablet Take 12.5 mg by mouth once a week. Caution:Chemotherapy. Protect from light. TAKES ON SAT     metoprolol succinate (TOPROL-XL) 50 MG 24 hr tablet TAKE 1 TABLET BY MOUTH EVERY DAY 90 tablet 0   Multiple Vitamin (MULTIVITAMIN) tablet Take 1 tablet by mouth daily.     Multiple Vitamins-Minerals (PRESERVISION AREDS) TABS Take 1 tablet by mouth 2 (two) times daily.     vitamin C (ASCORBIC ACID) 250 MG tablet  Take 250 mg by mouth daily.     No facility-administered medications prior to visit.     EXAM:  BP 120/70 (BP Location: Left Arm, Patient Position: Sitting, Cuff Size: Large)   Pulse 67   Temp 98.6 F (37 C) (Oral)   Ht _0  (1.575 m)   Wt 165 lb 6.4 oz (75 kg)   LMP 04/19/1997   SpO2 99%   BMI 30.25 kg/m   Body mass index is 30.25 kg/m.  GENERAL: vitals reviewed and listed above, alert, oriented, appears well hydrated and in no acute distress HEENT: atraumatic, conjunctiva  clear, no obvious abnormalities on inspection of external nose and ears OP : masked  NECK: no obvious masses on inspection palpation  LUNGS: clear to auscultation bilaterally, no wheezes, rales or rhonchi, good air movement Abdomen:  Sof,t normal bowel sounds without hepatosplenomegaly, no guarding rebound or masses no CVA tenderness CV: HRRR, no clubbing cyanosis or  peripheral edema nl cap refill  MS: moves all extremities without noticeable focal  abnormality Skin no obv suspcious lesion Back well healed scar PSYCH: pleasant and cooperative, no obvious depression or anxiety Lab Results  Component Value Date   WBC 12.9 07/03/2020   HGB 12.8 07/03/2020   HCT 37 07/03/2020   PLT 349 07/03/2020   GLUCOSE 95 11/15/2018   CHOL 150 04/29/2017   TRIG 76.0 04/29/2017   HDL 60.30 04/29/2017   LDLCALC 75 04/29/2017   ALT 25 01/11/2020   AST 20 01/11/2020   NA 137 07/03/2020   K 3.7 07/03/2020   CL 97 (A) 07/03/2020   CREATININE 0.7 07/03/2020   BUN 19 07/03/2020   CO2 23 (A) 07/03/2020   TSH 1.12 11/13/2014   HGBA1C 5.6 11/15/2018   BP Readings from Last 3 Encounters:  08/21/20 120/70  01/25/20 118/90  01/09/20 110/72  Lab review  done by  specialist providers  in range   ASSESSMENT AND PLAN:  Discussed the following assessment and plan:  Essential hypertension  Medication management  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 No change in med  -Patient advised to return or notify health care team  if  new concerns arise. In interim  30 minutes review exam and counsel  Patient Instructions  BP is good today    No change in  medication at this time.  Labs are fine .  Advise Shingrix vaccine as discussed .  Continue lifestyle intervention healthy eating and exercise .   Keep [place] updated .  Yearly  check  or as needed .  Standley Brooking. Gabriell Daigneault M.D.

## 2020-08-21 ENCOUNTER — Encounter: Payer: Self-pay | Admitting: Internal Medicine

## 2020-08-21 ENCOUNTER — Ambulatory Visit (INDEPENDENT_AMBULATORY_CARE_PROVIDER_SITE_OTHER): Payer: Medicare Other | Admitting: Internal Medicine

## 2020-08-21 ENCOUNTER — Other Ambulatory Visit: Payer: Self-pay

## 2020-08-21 VITALS — BP 120/70 | HR 67 | Temp 98.6°F | Ht 62.0 in | Wt 165.4 lb

## 2020-08-21 DIAGNOSIS — Z8616 Personal history of COVID-19: Secondary | ICD-10-CM | POA: Diagnosis not present

## 2020-08-21 DIAGNOSIS — Z79899 Other long term (current) drug therapy: Secondary | ICD-10-CM | POA: Diagnosis not present

## 2020-08-21 DIAGNOSIS — R7309 Other abnormal glucose: Secondary | ICD-10-CM

## 2020-08-21 DIAGNOSIS — L4050 Arthropathic psoriasis, unspecified: Secondary | ICD-10-CM | POA: Diagnosis not present

## 2020-08-21 DIAGNOSIS — I10 Essential (primary) hypertension: Secondary | ICD-10-CM | POA: Diagnosis not present

## 2020-08-21 NOTE — Patient Instructions (Addendum)
BP is good today    No change in  medication at this time.  Labs are fine .  Advise Shingrix vaccine as discussed .  Continue lifestyle intervention healthy eating and exercise .   Keep [place] updated .  Yearly  check  or as needed .

## 2020-09-10 DIAGNOSIS — H353212 Exudative age-related macular degeneration, right eye, with inactive choroidal neovascularization: Secondary | ICD-10-CM | POA: Diagnosis not present

## 2020-09-10 DIAGNOSIS — H524 Presbyopia: Secondary | ICD-10-CM | POA: Diagnosis not present

## 2020-09-10 DIAGNOSIS — H353121 Nonexudative age-related macular degeneration, left eye, early dry stage: Secondary | ICD-10-CM | POA: Diagnosis not present

## 2020-09-10 DIAGNOSIS — H52203 Unspecified astigmatism, bilateral: Secondary | ICD-10-CM | POA: Diagnosis not present

## 2020-09-23 DIAGNOSIS — H353122 Nonexudative age-related macular degeneration, left eye, intermediate dry stage: Secondary | ICD-10-CM | POA: Diagnosis not present

## 2020-09-23 DIAGNOSIS — H353211 Exudative age-related macular degeneration, right eye, with active choroidal neovascularization: Secondary | ICD-10-CM | POA: Diagnosis not present

## 2020-10-10 DIAGNOSIS — Z79899 Other long term (current) drug therapy: Secondary | ICD-10-CM | POA: Diagnosis not present

## 2020-10-10 DIAGNOSIS — L4050 Arthropathic psoriasis, unspecified: Secondary | ICD-10-CM | POA: Diagnosis not present

## 2020-10-10 DIAGNOSIS — M1990 Unspecified osteoarthritis, unspecified site: Secondary | ICD-10-CM | POA: Diagnosis not present

## 2020-10-10 DIAGNOSIS — M5136 Other intervertebral disc degeneration, lumbar region: Secondary | ICD-10-CM | POA: Diagnosis not present

## 2020-10-10 DIAGNOSIS — M19049 Primary osteoarthritis, unspecified hand: Secondary | ICD-10-CM | POA: Diagnosis not present

## 2020-10-10 LAB — BASIC METABOLIC PANEL
BUN: 19 (ref 4–21)
CO2: 31 — AB (ref 13–22)
Chloride: 98 — AB (ref 99–108)
Creatinine: 0.8 (ref 0.5–1.1)
Glucose: 103
Potassium: 3.7 (ref 3.4–5.3)
Sodium: 136 — AB (ref 137–147)

## 2020-10-10 LAB — COMPREHENSIVE METABOLIC PANEL
Albumin: 3.7 (ref 3.5–5.0)
Calcium: 9.3 (ref 8.7–10.7)
GFR calc Af Amer: 86
GFR calc non Af Amer: 75
Globulin: 3.3

## 2020-10-10 LAB — CBC AND DIFFERENTIAL
HCT: 39 (ref 36–46)
Hemoglobin: 13.2 (ref 12.0–16.0)
Neutrophils Absolute: 54.8
Platelets: 334 (ref 150–399)
WBC: 12.6

## 2020-10-10 LAB — HEPATIC FUNCTION PANEL: Alkaline Phosphatase: 109 (ref 25–125)

## 2020-10-10 LAB — CBC: RBC: 4.3 (ref 3.87–5.11)

## 2020-10-14 ENCOUNTER — Encounter: Payer: Self-pay | Admitting: Internal Medicine

## 2020-10-16 ENCOUNTER — Telehealth: Payer: Self-pay | Admitting: Internal Medicine

## 2020-10-16 NOTE — Telephone Encounter (Signed)
PT would like to see if Dr.Panosh will see her daughter and would like to talk to Dr.Panosh about this.

## 2020-10-21 DIAGNOSIS — H353122 Nonexudative age-related macular degeneration, left eye, intermediate dry stage: Secondary | ICD-10-CM | POA: Diagnosis not present

## 2020-10-21 DIAGNOSIS — H353211 Exudative age-related macular degeneration, right eye, with active choroidal neovascularization: Secondary | ICD-10-CM | POA: Diagnosis not present

## 2020-10-25 DIAGNOSIS — Z23 Encounter for immunization: Secondary | ICD-10-CM | POA: Diagnosis not present

## 2020-10-28 NOTE — Telephone Encounter (Signed)
Apologies for the late response with this message Unfortunately I am not taking any new patients  .  But thanks for the confidence.

## 2020-10-28 NOTE — Telephone Encounter (Signed)
Pt informed of the message and verbalized understanding  

## 2020-11-04 ENCOUNTER — Other Ambulatory Visit: Payer: Self-pay | Admitting: Internal Medicine

## 2020-11-08 ENCOUNTER — Other Ambulatory Visit: Payer: Self-pay

## 2020-11-08 ENCOUNTER — Ambulatory Visit (INDEPENDENT_AMBULATORY_CARE_PROVIDER_SITE_OTHER): Payer: Medicare Other | Admitting: *Deleted

## 2020-11-08 DIAGNOSIS — Z23 Encounter for immunization: Secondary | ICD-10-CM

## 2020-11-26 DIAGNOSIS — H43813 Vitreous degeneration, bilateral: Secondary | ICD-10-CM | POA: Diagnosis not present

## 2020-11-26 DIAGNOSIS — H353122 Nonexudative age-related macular degeneration, left eye, intermediate dry stage: Secondary | ICD-10-CM | POA: Diagnosis not present

## 2020-11-26 DIAGNOSIS — H35373 Puckering of macula, bilateral: Secondary | ICD-10-CM | POA: Diagnosis not present

## 2020-11-26 DIAGNOSIS — H353211 Exudative age-related macular degeneration, right eye, with active choroidal neovascularization: Secondary | ICD-10-CM | POA: Diagnosis not present

## 2020-12-13 DIAGNOSIS — G8929 Other chronic pain: Secondary | ICD-10-CM | POA: Diagnosis not present

## 2020-12-13 DIAGNOSIS — I10 Essential (primary) hypertension: Secondary | ICD-10-CM | POA: Diagnosis not present

## 2020-12-13 DIAGNOSIS — Z6830 Body mass index (BMI) 30.0-30.9, adult: Secondary | ICD-10-CM | POA: Diagnosis not present

## 2020-12-13 DIAGNOSIS — M5136 Other intervertebral disc degeneration, lumbar region: Secondary | ICD-10-CM | POA: Diagnosis not present

## 2020-12-13 DIAGNOSIS — M5442 Lumbago with sciatica, left side: Secondary | ICD-10-CM | POA: Diagnosis not present

## 2020-12-13 DIAGNOSIS — M4316 Spondylolisthesis, lumbar region: Secondary | ICD-10-CM | POA: Diagnosis not present

## 2020-12-13 DIAGNOSIS — M5441 Lumbago with sciatica, right side: Secondary | ICD-10-CM | POA: Diagnosis not present

## 2020-12-24 DIAGNOSIS — H353211 Exudative age-related macular degeneration, right eye, with active choroidal neovascularization: Secondary | ICD-10-CM | POA: Diagnosis not present

## 2020-12-24 DIAGNOSIS — H353122 Nonexudative age-related macular degeneration, left eye, intermediate dry stage: Secondary | ICD-10-CM | POA: Diagnosis not present

## 2020-12-31 DIAGNOSIS — R202 Paresthesia of skin: Secondary | ICD-10-CM | POA: Diagnosis not present

## 2020-12-31 DIAGNOSIS — M4316 Spondylolisthesis, lumbar region: Secondary | ICD-10-CM | POA: Diagnosis not present

## 2020-12-31 DIAGNOSIS — R200 Anesthesia of skin: Secondary | ICD-10-CM | POA: Diagnosis not present

## 2020-12-31 DIAGNOSIS — M5136 Other intervertebral disc degeneration, lumbar region: Secondary | ICD-10-CM | POA: Diagnosis not present

## 2020-12-31 DIAGNOSIS — M545 Low back pain, unspecified: Secondary | ICD-10-CM | POA: Diagnosis not present

## 2021-01-03 DIAGNOSIS — I10 Essential (primary) hypertension: Secondary | ICD-10-CM | POA: Diagnosis not present

## 2021-01-03 DIAGNOSIS — M48062 Spinal stenosis, lumbar region with neurogenic claudication: Secondary | ICD-10-CM | POA: Diagnosis not present

## 2021-01-03 DIAGNOSIS — Z6830 Body mass index (BMI) 30.0-30.9, adult: Secondary | ICD-10-CM | POA: Diagnosis not present

## 2021-01-09 DIAGNOSIS — Z79899 Other long term (current) drug therapy: Secondary | ICD-10-CM | POA: Diagnosis not present

## 2021-01-09 DIAGNOSIS — M1990 Unspecified osteoarthritis, unspecified site: Secondary | ICD-10-CM | POA: Diagnosis not present

## 2021-01-09 DIAGNOSIS — M5136 Other intervertebral disc degeneration, lumbar region: Secondary | ICD-10-CM | POA: Diagnosis not present

## 2021-01-09 DIAGNOSIS — L4050 Arthropathic psoriasis, unspecified: Secondary | ICD-10-CM | POA: Diagnosis not present

## 2021-01-09 DIAGNOSIS — M549 Dorsalgia, unspecified: Secondary | ICD-10-CM | POA: Diagnosis not present

## 2021-01-09 DIAGNOSIS — I10 Essential (primary) hypertension: Secondary | ICD-10-CM | POA: Diagnosis not present

## 2021-01-09 LAB — BASIC METABOLIC PANEL
BUN: 23 — AB (ref 4–21)
CO2: 32 — AB (ref 13–22)
Chloride: 98 — AB (ref 99–108)
Creatinine: 0.7 (ref 0.5–1.1)
Glucose: 103
Potassium: 4.1 (ref 3.4–5.3)
Sodium: 136 — AB (ref 137–147)

## 2021-01-09 LAB — COMPREHENSIVE METABOLIC PANEL
Albumin: 1.1 — AB (ref 3.5–5.0)
Calcium: 9.5 (ref 8.7–10.7)
GFR calc Af Amer: 92
GFR calc non Af Amer: 79
Globulin: 3.3

## 2021-01-09 LAB — HEPATIC FUNCTION PANEL
ALT: 33 (ref 7–35)
AST: 23 (ref 13–35)
Alkaline Phosphatase: 112 (ref 25–125)

## 2021-01-09 LAB — CBC: RBC: 4.3 (ref 3.87–5.11)

## 2021-01-09 LAB — CBC AND DIFFERENTIAL
HCT: 39 (ref 36–46)
Hemoglobin: 13.4 (ref 12.0–16.0)
Neutrophils Absolute: 6.9

## 2021-01-10 ENCOUNTER — Other Ambulatory Visit: Payer: Self-pay | Admitting: Neurosurgery

## 2021-01-14 ENCOUNTER — Telehealth: Payer: Self-pay | Admitting: Internal Medicine

## 2021-01-14 ENCOUNTER — Ambulatory Visit: Payer: Medicare Other

## 2021-01-14 NOTE — Telephone Encounter (Signed)
Left message for patient to call back and schedule Medicare Annual Wellness Visit (AWV) either virtually or in office. Left  my Herbie Drape number [phone]   Last AWV ;01/09/20  please schedule at anytime with [facility] Nurse Health Advisor 1 or 2   This should be a 45 minute visit.

## 2021-01-17 ENCOUNTER — Encounter: Payer: Self-pay | Admitting: Internal Medicine

## 2021-01-21 DIAGNOSIS — H35373 Puckering of macula, bilateral: Secondary | ICD-10-CM | POA: Diagnosis not present

## 2021-01-21 DIAGNOSIS — H353122 Nonexudative age-related macular degeneration, left eye, intermediate dry stage: Secondary | ICD-10-CM | POA: Diagnosis not present

## 2021-01-21 DIAGNOSIS — H43813 Vitreous degeneration, bilateral: Secondary | ICD-10-CM | POA: Diagnosis not present

## 2021-01-21 DIAGNOSIS — H353211 Exudative age-related macular degeneration, right eye, with active choroidal neovascularization: Secondary | ICD-10-CM | POA: Diagnosis not present

## 2021-02-02 ENCOUNTER — Other Ambulatory Visit: Payer: Self-pay | Admitting: Internal Medicine

## 2021-02-03 ENCOUNTER — Other Ambulatory Visit: Payer: Self-pay | Admitting: Internal Medicine

## 2021-02-04 ENCOUNTER — Other Ambulatory Visit: Payer: Self-pay | Admitting: Neurosurgery

## 2021-02-07 NOTE — Pre-Procedure Instructions (Signed)
Surgical Instructions    Your procedure is scheduled on Wednesday 02/12/21.   Report to [facility] Main Entrance "A" at 06:30 A.M., then check in with the Admitting office.  Call this number if you have problems the morning of surgery:  [phone]   If you have any questions prior to your surgery date call [phone]: Open Monday-Friday 8am-4pm    Remember:  Do not eat or drink after midnight the night before your surgery    Take these medicines the morning of surgery with A SIP OF WATER   metoprolol succinate (TOPROL-XL)  As of today, STOP taking any Aspirin (unless otherwise instructed by your surgeon) diclofenac Sodium (VOLTAREN), Aleve, Naproxen, Ibuprofen, Motrin, Advil, Goody's, BC's, all herbal medications, fish oil, and all vitamins.                     Do NOT Smoke (Tobacco/Vaping) or drink Alcohol 24 hours prior to your procedure.  If you use a CPAP at night, you may bring all equipment for your overnight stay.   Contacts, glasses, piercing's, hearing aid's, dentures or partials may not be worn into surgery, please bring cases for these belongings.    For patients admitted to the hospital, discharge time will be determined by your treatment team.   Patients discharged the day of surgery will not be allowed to drive home, and someone needs to stay with them for 24 hours.  NO VISITORS WILL BE ALLOWED IN PRE-OP WHERE PATIENTS GET READY FOR SURGERY.  ONLY 1 SUPPORT PERSON MAY BE PRESENT IN THE WAITING ROOM WHILE YOU ARE IN SURGERY.  IF YOU ARE TO BE ADMITTED, ONCE YOU ARE IN YOUR ROOM YOU WILL BE ALLOWED TWO (2) VISITORS.  Minor children may have two parents present. Special consideration for safety and communication needs will be reviewed on a case by case basis.   Special instructions:   [place]- Preparing For Surgery  Before surgery, you can play an important role. Because skin is not sterile, your skin needs to be as free of germs as possible. You can reduce the  number of germs on your skin by washing with CHG (chlorahexidine gluconate) Soap before surgery.  CHG is an antiseptic cleaner which kills germs and bonds with the skin to continue killing germs even after washing.    Oral Hygiene is also important to reduce your risk of infection.  Remember - BRUSH YOUR TEETH THE MORNING OF SURGERY WITH YOUR REGULAR TOOTHPASTE  Please do not use if you have an allergy to CHG or antibacterial soaps. If your skin becomes reddened/irritated stop using the CHG.  Do not shave (including legs and underarms) for at least 48 hours prior to first CHG shower. It is OK to shave your face.  Please follow these instructions carefully.   Shower the NIGHT BEFORE SURGERY and the MORNING OF SURGERY  If you chose to wash your hair, wash your hair first as usual with your normal shampoo.  After you shampoo, rinse your hair and body thoroughly to remove the shampoo.  Use CHG Soap as you would any other liquid soap. You can apply CHG directly to the skin and wash gently with a scrungie or a clean washcloth.   Apply the CHG Soap to your body ONLY FROM THE NECK DOWN.  Do not use on open wounds or open sores. Avoid contact with your eyes, ears, mouth and genitals (private parts). Wash Face and genitals (private parts)  with your normal soap.  Wash thoroughly, paying special attention to the area where your surgery will be performed.  Thoroughly rinse your body with warm water from the neck down.  DO NOT shower/wash with your normal soap after using and rinsing off the CHG Soap.  Pat yourself dry with a CLEAN TOWEL.  Wear CLEAN PAJAMAS to bed the night before surgery  Place CLEAN SHEETS on your bed the night before your surgery  DO NOT SLEEP WITH PETS.   Day of Surgery: Shower with CHG soap. Do not wear jewelry, make up, nail polish, gel polish, artificial nails, or any other type of covering on natural nails including finger and toenails. If patients have artificial  nails, gel coating, etc. that need to be removed by a nail salon please have this removed prior to surgery. Surgery may need to be canceled/delayed if the surgeon/ anesthesia feels like the patient is unable to be adequately monitored. Do not wear lotions, powders, perfumes/colognes, or deodorant. Do not shave 48 hours prior to surgery.  Men may shave face and neck. Do not bring valuables to the hospital. [facility] is not responsible for any belongings or valuables. Wear Clean/Comfortable clothing the morning of surgery Remember to brush your teeth WITH YOUR REGULAR TOOTHPASTE.   Please read over the following fact sheets that you were given.   3 days prior to your procedure or After your COVID test   You are not required to quarantine however you are required to wear a well-fitting mask when you are out and around people not in your household. If your mask becomes wet or soiled, replace with a new one.   Wash your hands often with soap and water for 20 seconds or clean your hands with an alcohol-based hand sanitizer that contains at least 60% alcohol.   Do not share personal items.   Notify your provider:  o if you are in close contact with someone who has COVID  o or if you develop a fever of 100.4 or greater, sneezing, cough, sore throat, shortness of breath or body aches.

## 2021-02-10 ENCOUNTER — Other Ambulatory Visit: Payer: Self-pay

## 2021-02-10 ENCOUNTER — Encounter (HOSPITAL_COMMUNITY): Payer: Self-pay

## 2021-02-10 ENCOUNTER — Encounter (HOSPITAL_COMMUNITY)
Admission: RE | Admit: 2021-02-10 | Discharge: 2021-02-10 | Disposition: A | Discharge status: Home / Self Care | Payer: Medicare Other | Source: Ambulatory Visit | Attending: Neurosurgery | Admitting: Neurosurgery

## 2021-02-10 ENCOUNTER — Other Ambulatory Visit (HOSPITAL_COMMUNITY): Payer: Medicare Other

## 2021-02-10 VITALS — BP 158/73 | HR 55 | Temp 97.9°F | Resp 17 | Ht 62.0 in | Wt 166.5 lb

## 2021-02-10 DIAGNOSIS — M4726 Other spondylosis with radiculopathy, lumbar region: Secondary | ICD-10-CM | POA: Diagnosis present

## 2021-02-10 DIAGNOSIS — Z90722 Acquired absence of ovaries, bilateral: Secondary | ICD-10-CM | POA: Diagnosis not present

## 2021-02-10 DIAGNOSIS — I10 Essential (primary) hypertension: Secondary | ICD-10-CM

## 2021-02-10 DIAGNOSIS — Z01818 Encounter for other preprocedural examination: Secondary | ICD-10-CM | POA: Insufficient documentation

## 2021-02-10 DIAGNOSIS — M4326 Fusion of spine, lumbar region: Secondary | ICD-10-CM | POA: Diagnosis not present

## 2021-02-10 DIAGNOSIS — Z20822 Contact with and (suspected) exposure to covid-19: Secondary | ICD-10-CM | POA: Insufficient documentation

## 2021-02-10 DIAGNOSIS — M5116 Intervertebral disc disorders with radiculopathy, lumbar region: Secondary | ICD-10-CM | POA: Diagnosis not present

## 2021-02-10 DIAGNOSIS — Z8042 Family history of malignant neoplasm of prostate: Secondary | ICD-10-CM | POA: Diagnosis not present

## 2021-02-10 DIAGNOSIS — Z8051 Family history of malignant neoplasm of kidney: Secondary | ICD-10-CM | POA: Diagnosis not present

## 2021-02-10 DIAGNOSIS — M48062 Spinal stenosis, lumbar region with neurogenic claudication: Secondary | ICD-10-CM | POA: Diagnosis not present

## 2021-02-10 DIAGNOSIS — Z8249 Family history of ischemic heart disease and other diseases of the circulatory system: Secondary | ICD-10-CM | POA: Diagnosis not present

## 2021-02-10 DIAGNOSIS — Z803 Family history of malignant neoplasm of breast: Secondary | ICD-10-CM | POA: Diagnosis not present

## 2021-02-10 DIAGNOSIS — Z8262 Family history of osteoporosis: Secondary | ICD-10-CM | POA: Diagnosis not present

## 2021-02-10 DIAGNOSIS — Z823 Family history of stroke: Secondary | ICD-10-CM | POA: Diagnosis not present

## 2021-02-10 DIAGNOSIS — Z981 Arthrodesis status: Secondary | ICD-10-CM | POA: Diagnosis not present

## 2021-02-10 DIAGNOSIS — S32009K Unspecified fracture of unspecified lumbar vertebra, subsequent encounter for fracture with nonunion: Secondary | ICD-10-CM | POA: Diagnosis not present

## 2021-02-10 DIAGNOSIS — Z90710 Acquired absence of both cervix and uterus: Secondary | ICD-10-CM | POA: Diagnosis not present

## 2021-02-10 LAB — CBC
HCT: 39.7 % (ref 36.0–46.0)
Hemoglobin: 13.2 g/dL (ref 12.0–15.0)
MCH: 31.1 pg (ref 26.0–34.0)
MCHC: 33.2 g/dL (ref 30.0–36.0)
MCV: 93.4 fL (ref 80.0–100.0)
Platelets: 359 10*3/uL (ref 150–400)
RBC: 4.25 MIL/uL (ref 3.87–5.11)
RDW: 13.2 % (ref 11.5–15.5)
WBC: 9.4 10*3/uL (ref 4.0–10.5)
nRBC: 0 % (ref 0.0–0.2)

## 2021-02-10 LAB — TYPE AND SCREEN
ABO/RH(D): O POS
Antibody Screen: NEGATIVE

## 2021-02-10 LAB — BASIC METABOLIC PANEL
Anion gap: 10 (ref 5–15)
BUN: 16 mg/dL (ref 8–23)
CO2: 29 mmol/L (ref 22–32)
Calcium: 9.7 mg/dL (ref 8.9–10.3)
Chloride: 98 mmol/L (ref 98–111)
Creatinine, Ser: 0.69 mg/dL (ref 0.44–1.00)
GFR, Estimated: >60 mL/min (ref >60)
Glucose, Bld: 116 mg/dL — ABNORMAL HIGH (ref 70–99)
Potassium: 3.5 mmol/L (ref 3.5–5.1)
Sodium: 137 mmol/L (ref 135–145)

## 2021-02-10 LAB — SURGICAL PCR SCREEN
MRSA, PCR: NEGATIVE
Staphylococcus aureus: NEGATIVE

## 2021-02-10 LAB — SARS CORONAVIRUS 2 (TAT 6-24 HRS): SARS Coronavirus 2: NEGATIVE

## 2021-02-10 NOTE — Progress Notes (Signed)
PCP - Dr. Shanon Ace Cardiologist - denies  PPM/ICD - denies   Chest x-ray - 02/17/18 EKG - 02/10/21 at PAT Stress Test - denies ECHO - denies Cardiac Cath - denies  Sleep Study - denies   DM- denies  Blood Thinner Instructions: n/a Aspirin Instructions: n/a  ERAS Protcol - no, NPO   COVID TEST- 02/10/21 at PAT   Anesthesia review: no  Patient denies shortness of breath, fever, cough and chest pain at PAT appointment   All instructions explained to the patient, with a verbal understanding of the material. Patient agrees to go over the instructions while at home for a better understanding. Patient also instructed to wear a mask in public after being tested for COVID-19. The opportunity to ask questions was provided.

## 2021-02-11 NOTE — Anesthesia Preprocedure Evaluation (Addendum)
Anesthesia Evaluation  Patient identified by MRN, date of birth, ID band Patient awake    Reviewed: Allergy & Precautions, NPO status , Patient's Chart, lab work & pertinent test results  Airway Mallampati: II  TM Distance: >3 FB     Dental   Pulmonary neg pulmonary ROS,    breath sounds clear to auscultation       Cardiovascular hypertension,  Rhythm:Regular Rate:Normal     Neuro/Psych    GI/Hepatic negative GI ROS, Neg liver ROS,   Endo/Other  negative endocrine ROS  Renal/GU negative Renal ROS     Musculoskeletal   Abdominal   Peds  Hematology   Anesthesia Other Findings   Reproductive/Obstetrics                            Anesthesia Physical Anesthesia Plan  ASA: 3  Anesthesia Plan: General   Post-op Pain Management:    Induction: Intravenous  PONV Risk Score and Plan: 3 and Ondansetron and Dexamethasone  Airway Management Planned: Oral ETT  Additional Equipment:   Intra-op Plan:   Post-operative Plan: Possible Post-op intubation/ventilation  Informed Consent: I have reviewed the patients History and Physical, chart, labs and discussed the procedure including the risks, benefits and alternatives for the proposed anesthesia with the patient or authorized representative who has indicated his/her understanding and acceptance.     Dental advisory given  Plan Discussed with: CRNA  Anesthesia Plan Comments:         Anesthesia Quick Evaluation

## 2021-02-12 ENCOUNTER — Encounter (HOSPITAL_COMMUNITY)
Admission: RE | Disposition: A | Discharge status: Home / Self Care | Payer: Self-pay | Source: Home / Self Care | Attending: Neurosurgery

## 2021-02-12 ENCOUNTER — Inpatient Hospital Stay (HOSPITAL_COMMUNITY): Payer: Medicare Other

## 2021-02-12 ENCOUNTER — Inpatient Hospital Stay (HOSPITAL_COMMUNITY): Payer: Medicare Other | Admitting: Anesthesiology

## 2021-02-12 ENCOUNTER — Other Ambulatory Visit: Payer: Self-pay

## 2021-02-12 ENCOUNTER — Inpatient Hospital Stay (HOSPITAL_COMMUNITY)
Admission: RE | Admit: 2021-02-12 | Discharge: 2021-02-13 | DRG: 455 | Disposition: A | Discharge status: Home Health | Payer: Medicare Other | Attending: Neurosurgery | Admitting: Neurosurgery

## 2021-02-12 ENCOUNTER — Encounter (HOSPITAL_COMMUNITY): Payer: Self-pay | Admitting: Neurosurgery

## 2021-02-12 DIAGNOSIS — M5116 Intervertebral disc disorders with radiculopathy, lumbar region: Secondary | ICD-10-CM | POA: Diagnosis present

## 2021-02-12 DIAGNOSIS — Z20822 Contact with and (suspected) exposure to covid-19: Secondary | ICD-10-CM | POA: Diagnosis present

## 2021-02-12 DIAGNOSIS — Z90710 Acquired absence of both cervix and uterus: Secondary | ICD-10-CM

## 2021-02-12 DIAGNOSIS — Z8249 Family history of ischemic heart disease and other diseases of the circulatory system: Secondary | ICD-10-CM | POA: Diagnosis not present

## 2021-02-12 DIAGNOSIS — Z803 Family history of malignant neoplasm of breast: Secondary | ICD-10-CM | POA: Diagnosis not present

## 2021-02-12 DIAGNOSIS — Z8042 Family history of malignant neoplasm of prostate: Secondary | ICD-10-CM

## 2021-02-12 DIAGNOSIS — M4326 Fusion of spine, lumbar region: Secondary | ICD-10-CM | POA: Diagnosis not present

## 2021-02-12 DIAGNOSIS — Z823 Family history of stroke: Secondary | ICD-10-CM | POA: Diagnosis not present

## 2021-02-12 DIAGNOSIS — I10 Essential (primary) hypertension: Secondary | ICD-10-CM | POA: Diagnosis present

## 2021-02-12 DIAGNOSIS — M4726 Other spondylosis with radiculopathy, lumbar region: Secondary | ICD-10-CM | POA: Diagnosis present

## 2021-02-12 DIAGNOSIS — Z8262 Family history of osteoporosis: Secondary | ICD-10-CM

## 2021-02-12 DIAGNOSIS — M48062 Spinal stenosis, lumbar region with neurogenic claudication: Secondary | ICD-10-CM | POA: Diagnosis present

## 2021-02-12 DIAGNOSIS — Z981 Arthrodesis status: Secondary | ICD-10-CM | POA: Diagnosis not present

## 2021-02-12 DIAGNOSIS — Z90722 Acquired absence of ovaries, bilateral: Secondary | ICD-10-CM

## 2021-02-12 DIAGNOSIS — Z8051 Family history of malignant neoplasm of kidney: Secondary | ICD-10-CM | POA: Diagnosis not present

## 2021-02-12 DIAGNOSIS — Z419 Encounter for procedure for purposes other than remedying health state, unspecified: Secondary | ICD-10-CM

## 2021-02-12 SURGERY — POSTERIOR LUMBAR FUSION 1 LEVEL
Anesthesia: General

## 2021-02-12 MED ORDER — HEMOSTATIC AGENTS (NO CHARGE) OPTIME
TOPICAL | Status: DC | PRN
  Administered 2021-02-12: 1 via TOPICAL

## 2021-02-12 MED ORDER — 0.9 % SODIUM CHLORIDE (POUR BTL) OPTIME
TOPICAL | Status: DC | PRN
  Administered 2021-02-12: 1000 mL

## 2021-02-12 MED ORDER — BUPIVACAINE LIPOSOME 1.3 % IJ SUSP
INTRAMUSCULAR | Status: AC
  Filled 2021-02-12: qty 20

## 2021-02-12 MED ORDER — LOSARTAN POTASSIUM 50 MG PO TABS
100.0000 mg | ORAL_TABLET | Freq: Every day | ORAL | Status: DC
Start: 2021-02-12 — End: 2021-02-13
  Administered 2021-02-12: 100 mg via ORAL
  Filled 2021-02-12: qty 2

## 2021-02-12 MED ORDER — THROMBIN 20000 UNITS EX SOLR
CUTANEOUS | Status: DC | PRN
  Administered 2021-02-12: 20 mL via TOPICAL

## 2021-02-12 MED ORDER — PHENYLEPHRINE HCL-NACL 20-0.9 MG/250ML-% IV SOLN
INTRAVENOUS | Status: DC | PRN
  Administered 2021-02-12: 30 ug/min via INTRAVENOUS

## 2021-02-12 MED ORDER — SODIUM CHLORIDE 0.9% FLUSH: 3.0000 mL | INTRAVENOUS | Status: DC | PRN

## 2021-02-12 MED ORDER — BACITRACIN ZINC 500 UNIT/GM EX OINT
TOPICAL_OINTMENT | CUTANEOUS | Status: DC | PRN
  Administered 2021-02-12: 1 via TOPICAL

## 2021-02-12 MED ORDER — CHLORHEXIDINE GLUCONATE 0.12 % MT SOLN
15.0000 mL | Freq: Once | OROMUCOSAL | Status: AC
  Administered 2021-02-12: 15 mL via OROMUCOSAL
  Filled 2021-02-12: qty 15

## 2021-02-12 MED ORDER — THROMBIN 20000 UNITS EX SOLR
CUTANEOUS | Status: AC
  Filled 2021-02-12: qty 20000

## 2021-02-12 MED ORDER — CHLORHEXIDINE GLUCONATE CLOTH 2 % EX PADS: 6.0000 | MEDICATED_PAD | Freq: Once | CUTANEOUS | Status: DC

## 2021-02-12 MED ORDER — FENTANYL CITRATE (PF) 250 MCG/5ML IJ SOLN
INTRAMUSCULAR | Status: DC | PRN
Start: 2021-02-12 — End: 2021-02-12
  Administered 2021-02-12 (×2): 50 ug via INTRAVENOUS
  Administered 2021-02-12: 25 ug via INTRAVENOUS
  Administered 2021-02-12 (×2): 50 ug via INTRAVENOUS
  Administered 2021-02-12: 25 ug via INTRAVENOUS

## 2021-02-12 MED ORDER — SUCCINYLCHOLINE 20MG/ML (10ML) SYRINGE FOR MEDFUSION PUMP - OPTIME
INTRAMUSCULAR | Status: DC | PRN
  Administered 2021-02-12: 140 mg via INTRAVENOUS

## 2021-02-12 MED ORDER — DOCUSATE SODIUM 100 MG PO CAPS
100.0000 mg | ORAL_CAPSULE | Freq: Two times a day (BID) | ORAL | Status: DC
  Administered 2021-02-12 – 2021-02-13 (×2): 100 mg via ORAL
  Filled 2021-02-12 (×2): qty 1

## 2021-02-12 MED ORDER — ONDANSETRON HCL 4 MG/2ML IJ SOLN: 4.0000 mg | Freq: Four times a day (QID) | INTRAMUSCULAR | Status: DC | PRN

## 2021-02-12 MED ORDER — ACETAMINOPHEN 650 MG RE SUPP: 650.0000 mg | RECTAL | Status: DC | PRN

## 2021-02-12 MED ORDER — SUGAMMADEX SODIUM 200 MG/2ML IV SOLN
INTRAVENOUS | Status: DC | PRN
  Administered 2021-02-12: 200 mg via INTRAVENOUS

## 2021-02-12 MED ORDER — SODIUM CHLORIDE 0.9% FLUSH
3.0000 mL | Freq: Two times a day (BID) | INTRAVENOUS | Status: DC
  Administered 2021-02-12 (×2): 3 mL via INTRAVENOUS

## 2021-02-12 MED ORDER — ACETAMINOPHEN 10 MG/ML IV SOLN
INTRAVENOUS | Status: AC
  Filled 2021-02-12: qty 100

## 2021-02-12 MED ORDER — PROPOFOL 10 MG/ML IV BOLUS
INTRAVENOUS | Status: AC
  Filled 2021-02-12: qty 20

## 2021-02-12 MED ORDER — ROCURONIUM BROMIDE 10 MG/ML (PF) SYRINGE
PREFILLED_SYRINGE | INTRAVENOUS | Status: DC | PRN
  Administered 2021-02-12 (×2): 30 mg via INTRAVENOUS
  Administered 2021-02-12: 20 mg via INTRAVENOUS
  Administered 2021-02-12: 70 mg via INTRAVENOUS

## 2021-02-12 MED ORDER — ONDANSETRON HCL 4 MG/2ML IJ SOLN
INTRAMUSCULAR | Status: AC
  Filled 2021-02-12: qty 2

## 2021-02-12 MED ORDER — OXYCODONE HCL 5 MG PO TABS
10.0000 mg | ORAL_TABLET | ORAL | Status: DC | PRN
  Administered 2021-02-12: 10 mg via ORAL
  Filled 2021-02-12: qty 2

## 2021-02-12 MED ORDER — DEXMEDETOMIDINE (PRECEDEX) IN NS 20 MCG/5ML (4 MCG/ML) IV SYRINGE
PREFILLED_SYRINGE | INTRAVENOUS | Status: AC
  Filled 2021-02-12: qty 5

## 2021-02-12 MED ORDER — CEFAZOLIN SODIUM-DEXTROSE 2-4 GM/100ML-% IV SOLN
2.0000 g | Freq: Three times a day (TID) | INTRAVENOUS | Status: AC
  Administered 2021-02-12 – 2021-02-13 (×2): 2 g via INTRAVENOUS
  Filled 2021-02-12 (×2): qty 100

## 2021-02-12 MED ORDER — SODIUM CHLORIDE 0.9 % IV SOLN: 250.0000 mL | INTRAVENOUS | Status: DC

## 2021-02-12 MED ORDER — CHLORTHALIDONE 25 MG PO TABS
25.0000 mg | ORAL_TABLET | Freq: Every day | ORAL | Status: DC
  Administered 2021-02-12: 25 mg via ORAL
  Filled 2021-02-12 (×2): qty 1

## 2021-02-12 MED ORDER — PROPOFOL 10 MG/ML IV BOLUS
INTRAVENOUS | Status: DC | PRN
  Administered 2021-02-12: 140 mg via INTRAVENOUS
  Administered 2021-02-12: 40 mg via INTRAVENOUS

## 2021-02-12 MED ORDER — ACETAMINOPHEN 500 MG PO TABS
1000.0000 mg | ORAL_TABLET | Freq: Four times a day (QID) | ORAL | Status: DC
  Administered 2021-02-12: 1000 mg via ORAL
  Filled 2021-02-12: qty 2

## 2021-02-12 MED ORDER — OXYCODONE-ACETAMINOPHEN 5-325 MG PO TABS
1.0000 | ORAL_TABLET | ORAL | Status: DC | PRN
  Administered 2021-02-12 – 2021-02-13 (×4): 2 via ORAL
  Filled 2021-02-12 (×4): qty 2

## 2021-02-12 MED ORDER — BUPIVACAINE-EPINEPHRINE 0.5% -1:200000 IJ SOLN
INTRAMUSCULAR | Status: AC
  Filled 2021-02-12: qty 1

## 2021-02-12 MED ORDER — VITAMIN D3 25 MCG (1000 UNIT) PO TABS
2000.0000 [IU] | ORAL_TABLET | Freq: Every day | ORAL | Status: DC
  Administered 2021-02-13: 2000 [IU] via ORAL
  Filled 2021-02-12 (×2): qty 2

## 2021-02-12 MED ORDER — DEXAMETHASONE SODIUM PHOSPHATE 10 MG/ML IJ SOLN
INTRAMUSCULAR | Status: DC | PRN
Start: 2021-02-12 — End: 2021-02-12
  Administered 2021-02-12: 10 mg via INTRAVENOUS

## 2021-02-12 MED ORDER — ROCURONIUM BROMIDE 10 MG/ML (PF) SYRINGE
PREFILLED_SYRINGE | INTRAVENOUS | Status: AC
  Filled 2021-02-12: qty 10

## 2021-02-12 MED ORDER — ALBUMIN HUMAN 5 % IV SOLN: INTRAVENOUS | Status: DC | PRN

## 2021-02-12 MED ORDER — CEFAZOLIN SODIUM-DEXTROSE 2-4 GM/100ML-% IV SOLN
2.0000 g | INTRAVENOUS | Status: AC
  Administered 2021-02-12: 2 g via INTRAVENOUS
  Filled 2021-02-12: qty 100

## 2021-02-12 MED ORDER — FENTANYL CITRATE (PF) 250 MCG/5ML IJ SOLN
INTRAMUSCULAR | Status: AC
  Filled 2021-02-12: qty 5

## 2021-02-12 MED ORDER — ONDANSETRON HCL 4 MG PO TABS: 4.0000 mg | ORAL_TABLET | Freq: Four times a day (QID) | ORAL | Status: DC | PRN

## 2021-02-12 MED ORDER — BACITRACIN ZINC 500 UNIT/GM EX OINT
TOPICAL_OINTMENT | CUTANEOUS | Status: AC
  Filled 2021-02-12: qty 28.35

## 2021-02-12 MED ORDER — MORPHINE SULFATE (PF) 4 MG/ML IV SOLN
4.0000 mg | INTRAVENOUS | Status: DC | PRN
  Administered 2021-02-12 (×2): 4 mg via INTRAVENOUS
  Filled 2021-02-12 (×2): qty 1

## 2021-02-12 MED ORDER — ONDANSETRON HCL 4 MG/2ML IJ SOLN
INTRAMUSCULAR | Status: DC | PRN
Start: 2021-02-12 — End: 2021-02-12
  Administered 2021-02-12: 4 mg via INTRAVENOUS

## 2021-02-12 MED ORDER — METOPROLOL SUCCINATE ER 50 MG PO TB24
50.0000 mg | ORAL_TABLET | Freq: Every day | ORAL | Status: DC
  Administered 2021-02-13: 50 mg via ORAL
  Filled 2021-02-12: qty 1

## 2021-02-12 MED ORDER — THROMBIN 5000 UNITS EX SOLR: OROMUCOSAL | Status: DC | PRN

## 2021-02-12 MED ORDER — ACETAMINOPHEN 10 MG/ML IV SOLN
INTRAVENOUS | Status: DC | PRN
  Administered 2021-02-12: 1000 mg via INTRAVENOUS

## 2021-02-12 MED ORDER — PHENOL 1.4 % MT LIQD: 1.0000 | OROMUCOSAL | Status: DC | PRN

## 2021-02-12 MED ORDER — DEXAMETHASONE SODIUM PHOSPHATE 10 MG/ML IJ SOLN
INTRAMUSCULAR | Status: AC
  Filled 2021-02-12: qty 1

## 2021-02-12 MED ORDER — OXYCODONE HCL 5 MG PO TABS: 5.0000 mg | ORAL_TABLET | ORAL | Status: DC | PRN

## 2021-02-12 MED ORDER — ORAL CARE MOUTH RINSE: 15.0000 mL | Freq: Once | OROMUCOSAL | Status: AC

## 2021-02-12 MED ORDER — HYDROMORPHONE HCL 1 MG/ML IJ SOLN
INTRAMUSCULAR | Status: AC
  Filled 2021-02-12: qty 1

## 2021-02-12 MED ORDER — THROMBIN 5000 UNITS EX SOLR
CUTANEOUS | Status: AC
  Filled 2021-02-12: qty 5000

## 2021-02-12 MED ORDER — CYCLOBENZAPRINE HCL 10 MG PO TABS
10.0000 mg | ORAL_TABLET | Freq: Three times a day (TID) | ORAL | Status: DC | PRN
  Administered 2021-02-12: 10 mg via ORAL
  Filled 2021-02-12: qty 1

## 2021-02-12 MED ORDER — LIDOCAINE 2% (20 MG/ML) 5 ML SYRINGE
INTRAMUSCULAR | Status: DC | PRN
  Administered 2021-02-12: 100 mg via INTRAVENOUS

## 2021-02-12 MED ORDER — LACTATED RINGERS IV SOLN: INTRAVENOUS | Status: DC

## 2021-02-12 MED ORDER — BISACODYL 10 MG RE SUPP: 10.0000 mg | Freq: Every day | RECTAL | Status: DC | PRN

## 2021-02-12 MED ORDER — ACETAMINOPHEN 325 MG PO TABS: 650.0000 mg | ORAL_TABLET | ORAL | Status: DC | PRN

## 2021-02-12 MED ORDER — MENTHOL 3 MG MT LOZG: 1.0000 | LOZENGE | OROMUCOSAL | Status: DC | PRN

## 2021-02-12 MED ORDER — ZOLPIDEM TARTRATE 5 MG PO TABS: 5.0000 mg | ORAL_TABLET | Freq: Every evening | ORAL | Status: DC | PRN

## 2021-02-12 MED ORDER — HYDROMORPHONE HCL 1 MG/ML IJ SOLN
0.2500 mg | INTRAMUSCULAR | Status: DC | PRN
  Administered 2021-02-12 (×3): 0.25 mg via INTRAVENOUS

## 2021-02-12 MED ORDER — BUPIVACAINE-EPINEPHRINE (PF) 0.5% -1:200000 IJ SOLN
INTRAMUSCULAR | Status: DC | PRN
  Administered 2021-02-12: 10 mL

## 2021-02-12 MED ORDER — LIDOCAINE 2% (20 MG/ML) 5 ML SYRINGE
INTRAMUSCULAR | Status: AC
  Filled 2021-02-12: qty 5

## 2021-02-12 MED FILL — Heparin Sodium (Porcine) Inj 1000 Unit/ML: INTRAMUSCULAR | Qty: 30 | Status: AC

## 2021-02-12 MED FILL — Sodium Chloride IV Soln 0.9%: INTRAVENOUS | Qty: 1000 | Status: AC

## 2021-02-12 SURGICAL SUPPLY — 67 items
BAG COUNTER SPONGE SURGICOUNT (BAG) ×3 IMPLANT
BASKET BONE COLLECTION (BASKET) ×2 IMPLANT
BENZOIN TINCTURE PRP APPL 2/3 (GAUZE/BANDAGES/DRESSINGS) ×2 IMPLANT
BLADE CLIPPER SURG (BLADE) IMPLANT
BUR MATCHSTICK NEURO 3.0 LAGG (BURR) ×2 IMPLANT
BUR PRECISION FLUTE 6.0 (BURR) ×2 IMPLANT
CANISTER SUCT 3000ML PPV (MISCELLANEOUS) ×2 IMPLANT
CAP REVERE LOCKING (Cap) ×8 IMPLANT
CARTRIDGE OIL MAESTRO DRILL (MISCELLANEOUS) ×1 IMPLANT
CNTNR URN SCR LID CUP LEK RST (MISCELLANEOUS) ×1 IMPLANT
CONT SPEC 4OZ STRL OR WHT (MISCELLANEOUS) ×2
COVER BACK TABLE 60X90IN (DRAPES) ×2 IMPLANT
DECANTER SPIKE VIAL GLASS SM (MISCELLANEOUS) ×1 IMPLANT
DIFFUSER DRILL AIR PNEUMATIC (MISCELLANEOUS) ×2 IMPLANT
DRAPE C-ARM 42X72 X-RAY (DRAPES) ×4 IMPLANT
DRAPE HALF SHEET 40X57 (DRAPES) ×2 IMPLANT
DRAPE LAPAROTOMY 100X72X124 (DRAPES) ×2 IMPLANT
DRAPE SURG 17X23 STRL (DRAPES) ×8 IMPLANT
DRSG OPSITE POSTOP 4X6 (GAUZE/BANDAGES/DRESSINGS) ×2 IMPLANT
DRSG OPSITE POSTOP 4X8 (GAUZE/BANDAGES/DRESSINGS) ×1 IMPLANT
ELECT BLADE 4.0 EZ CLEAN MEGAD (MISCELLANEOUS) ×2
ELECT REM PT RETURN 9FT ADLT (ELECTROSURGICAL) ×2
ELECTRODE BLDE 4.0 EZ CLN MEGD (MISCELLANEOUS) ×1 IMPLANT
ELECTRODE REM PT RTRN 9FT ADLT (ELECTROSURGICAL) ×1 IMPLANT
EVACUATOR 1/8 PVC DRAIN (DRAIN) IMPLANT
GAUZE 4X4 16PLY ~~LOC~~+RFID DBL (SPONGE) ×2 IMPLANT
GLOVE EXAM NITRILE XL STR (GLOVE) IMPLANT
GLOVE SURG ENC MOIS LTX SZ8 (GLOVE) ×4 IMPLANT
GLOVE SURG ENC MOIS LTX SZ8.5 (GLOVE) ×4 IMPLANT
GOWN STRL REUS W/ TWL LRG LVL3 (GOWN DISPOSABLE) IMPLANT
GOWN STRL REUS W/ TWL XL LVL3 (GOWN DISPOSABLE) ×2 IMPLANT
GOWN STRL REUS W/TWL 2XL LVL3 (GOWN DISPOSABLE) IMPLANT
GOWN STRL REUS W/TWL LRG LVL3 (GOWN DISPOSABLE)
GOWN STRL REUS W/TWL XL LVL3 (GOWN DISPOSABLE) ×4
GRAFT TRIN ELITE MED MUSC TRAN (Graft) ×1 IMPLANT
HEMOSTAT POWDER KIT SURGIFOAM (HEMOSTASIS) ×2 IMPLANT
KIT BASIN OR (CUSTOM PROCEDURE TRAY) ×2 IMPLANT
KIT GRAFTMAG DEL NEURO DISP (NEUROSURGERY SUPPLIES) ×1 IMPLANT
KIT TURNOVER KIT B (KITS) ×2 IMPLANT
MILL MEDIUM DISP (BLADE) ×1 IMPLANT
NDL HYPO 21X1.5 SAFETY (NEEDLE) IMPLANT
NEEDLE HYPO 21X1.5 SAFETY (NEEDLE) ×2 IMPLANT
NEEDLE HYPO 22GX1.5 SAFETY (NEEDLE) ×2 IMPLANT
NS IRRIG 1000ML POUR BTL (IV SOLUTION) ×3 IMPLANT
OIL CARTRIDGE MAESTRO DRILL (MISCELLANEOUS) ×2
PACK LAMINECTOMY NEURO (CUSTOM PROCEDURE TRAY) ×2 IMPLANT
PAD ARMBOARD 7.5X6 YLW CONV (MISCELLANEOUS) ×6 IMPLANT
PATTIES SURGICAL .5 X1 (DISPOSABLE) IMPLANT
PATTIES SURGICAL 1X1 (DISPOSABLE) ×1 IMPLANT
PUTTY DBM 10CC CALC GRAN (Putty) ×1 IMPLANT
ROD CURVED REVERE 6.35X90MM (Rod) ×2 IMPLANT
SCREW 7.5X50MM (Screw) ×2 IMPLANT
SEALANT ADHERUS EXTEND TIP (MISCELLANEOUS) ×1 IMPLANT
SPACER ALTERA 10X31 8-12MM-8 (Spacer) ×1 IMPLANT
SPONGE NEURO XRAY DETECT 1X3 (DISPOSABLE) IMPLANT
SPONGE SURGIFOAM ABS GEL 100 (HEMOSTASIS) ×1 IMPLANT
SPONGE T-LAP 4X18 ~~LOC~~+RFID (SPONGE) ×1 IMPLANT
STRIP CLOSURE SKIN 1/2X4 (GAUZE/BANDAGES/DRESSINGS) ×2 IMPLANT
SUT PROLENE 0 TP 1 60 (SUTURE) ×2 IMPLANT
SUT VIC AB 1 CT1 18XBRD ANBCTR (SUTURE) ×2 IMPLANT
SUT VIC AB 1 CT1 8-18 (SUTURE) ×4
SUT VIC AB 2-0 CP2 18 (SUTURE) ×3 IMPLANT
SYR 20ML LL LF (SYRINGE) ×1 IMPLANT
TOWEL GREEN STERILE (TOWEL DISPOSABLE) ×2 IMPLANT
TOWEL GREEN STERILE FF (TOWEL DISPOSABLE) ×2 IMPLANT
TRAY FOLEY MTR SLVR 16FR STAT (SET/KITS/TRAYS/PACK) ×2 IMPLANT
WATER STERILE IRR 1000ML POUR (IV SOLUTION) ×2 IMPLANT

## 2021-02-12 NOTE — TOC Progression Note (Signed)
Transition of Care [facility]) - Progression Note    Patient Details  Name: Michelle [name] MRN: [id number] Date of Birth: 12/24/1940  Transition of Care [facility]) CM/SW Contact  Ressie Slevin, Edson Snowball, RN Phone Number: 02/12/2021, 3:46 PM  Clinical Narrative:      Received message from Thayer Headings with [facility], they received referral from Dr Arnoldo Morale office for home health.       Expected Discharge Plan and Services         Transition of Care [facility]) Screening Note   Patient Details  Name: Michelle [name] Date of Birth: 12-Nov-1940     Transition of Care Department [facility]) has reviewed patient and no TOC needs have been identified at this time. We will continue to monitor patient advancement through interdisciplinary progression rounds. If new patient transition needs arise, please place a TOC consult.                                           Social Determinants of Health (SDOH) Interventions    Readmission Risk Interventions No flowsheet data found.

## 2021-02-12 NOTE — Progress Notes (Signed)
Orthopedic Tech Progress Note Patient Details:  Michelle [name] November 01, 1940 [id number]  Ortho Devices Type of Ortho Device: Lumbar corsett Ortho Device/Splint Interventions: Ordered     Dropped off in pt's room prior to arrival from PACU.  Vernona Rieger 02/12/2021, 3:07 PM

## 2021-02-12 NOTE — Anesthesia Postprocedure Evaluation (Signed)
Anesthesia Post Note  Patient: Michelle [name]  Procedure(s) Performed: POSTERIOR LUMBAR INTERBODY  FUSION, INTERBODY PROSTHESIS,POSTERIOR INSTRUMENTATION, LUMBAR TWO-THREE;EXPLORE FUSION     Patient location during evaluation: PACU Anesthesia Type: General Level of consciousness: awake Pain management: pain level controlled Vital Signs Assessment: post-procedure vital signs reviewed and stable Respiratory status: spontaneous breathing Cardiovascular status: stable Postop Assessment: no apparent nausea or vomiting Anesthetic complications: no   No notable events documented.  Last Vitals:  Vitals:   02/12/21 1415 02/12/21 1620  BP: 137/63 (!) 143/58  Pulse: 71 71  Resp: 18 18  Temp:  37.4 C  SpO2: 100% 95%    Last Pain:  Vitals:   02/12/21 1824  TempSrc:   PainSc: 8                  Marlissa Emerick

## 2021-02-12 NOTE — Transfer of Care (Signed)
Immediate Anesthesia Transfer of Care Note  Patient: Michelle [name]  Procedure(s) Performed: POSTERIOR LUMBAR INTERBODY  FUSION, INTERBODY PROSTHESIS,POSTERIOR INSTRUMENTATION, LUMBAR TWO-THREE;EXPLORE FUSION  Patient Location: PACU  Anesthesia Type:General  Level of Consciousness: drowsy and patient cooperative  Airway & Oxygen Therapy: Patient Spontanous Breathing and Patient connected to face mask oxygen  Post-op Assessment: Report given to RN, Post -op Vital signs reviewed and stable and Patient moving all extremities X 4  Post vital signs: Reviewed and stable  Last Vitals:  Vitals Value Taken Time  BP 120/66   Temp    Pulse 60 02/12/21 1234  Resp 12 02/12/21 1234  SpO2 100 % 02/12/21 1234  Vitals shown include unvalidated device data.  Last Pain:  Vitals:   02/12/21 0656  TempSrc:   PainSc: 4          Complications: No notable events documented.

## 2021-02-12 NOTE — H&P (Signed)
Subjective: The patient is a 81 year old white female on whom I previously formed a lumbar fusion 9 years ago.  She has developed recurrent back and right leg pain consistent with neurogenic claudication/lumbar radiculopathy.  She failed medical management and was worked up with lumbar x-rays and lumbar MRI which demonstrated severe spinal stenosis at L2-3.  I discussed the various treatment options with her.  She has decided proceed with surgery.  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   Hypertension    Osteopenia    Dr.John McComb    Past Surgical History:  Procedure Laterality Date   BACK SURGERY  09/2010;02/2012   DISC REMOVED AND SPACER/FUSION PLACED    CATARACT EXTRACTION, BILATERAL     COLONOSCOPY     negative   TOTAL ABDOMINAL HYSTERECTOMY W/ BILATERAL SALPINGOOPHORECTOMY     For Fibroids   TUBAL LIGATION      No Known Allergies  Social History   Tobacco Use   Smoking status: Never   Smokeless tobacco: Never  Substance Use Topics   Alcohol use: No    Family History  Problem Relation Age of Onset   Kidney cancer Father        prostate    Cancer Father    Hypertension Mother        died at age 79    Autoimmune disease Mother        Hemolytic Anemia   Breast cancer Maternal Aunt    Hypertension Maternal Aunt    Stroke Maternal Grandfather 1   Osteoporosis Other        Mother, 2 M aunts , MGM   Atrial fibrillation Daughter 50   Diabetes Neg Hx    Heart disease Neg Hx    Prior to Admission medications   Medication Sig Start Date End Date Taking? Authorizing Provider  acetaminophen (TYLENOL) 500 MG tablet Take 1,000 mg by mouth every 6 (six) hours as needed.   Yes [provider]  chlorthalidone (HYGROTON) 25 MG tablet TAKE 1/2 - 1 TABLET BY MOUTH EVERY DAY AS DIRECTED. 02/06/21  Yes Panosh, Standley Brooking, MD  Cholecalciferol (VITAMIN D) 2000 UNITS CAPS Take 2,000 Units by mouth daily.   Yes [provider]   diclofenac Sodium (VOLTAREN) 1 % GEL Apply 2 g topically daily as needed (pain).   Yes [provider]  folic acid (FOLVITE) 1 MG tablet Take 1 mg by mouth daily.   Yes [provider]  losartan (COZAAR) 100 MG tablet TAKE 1 TABLET BY MOUTH EVERY DAY 02/05/21  Yes Panosh, Standley Brooking, MD  methotrexate (RHEUMATREX) 2.5 MG tablet Take 12.5 mg by mouth every Saturday. Caution:Chemotherapy. Protect from light. TAKES ON SAT   Yes [provider]  metoprolol succinate (TOPROL-XL) 50 MG 24 hr tablet TAKE 1 TABLET BY MOUTH EVERY DAY 02/05/21  Yes Panosh, Standley Brooking, MD  Multiple Vitamin (MULTIVITAMIN) tablet Take 1 tablet by mouth daily.   Yes [provider]  Multiple Vitamins-Minerals (PRESERVISION AREDS) TABS Take 1 tablet by mouth 2 (two) times daily.   Yes [provider]  vitamin C (ASCORBIC ACID) 250 MG tablet Take 250 mg by mouth daily.   Yes [provider]     Review of Systems  Positive ROS: As above  All other systems have been reviewed and were otherwise negative with the exception of those mentioned in the HPI and as above.  Objective: Vital signs in last 24 hours: Temp:  [  97.7 F (36.5 C)] 97.7 F (36.5 C) (01/11 0635) Pulse Rate:  [72] 72 (01/11 0635) Resp:  [17] 17 (01/11 0635) BP: (158)/(79) 158/79 (01/11 0635) SpO2:  [95 %] 95 % (01/11 0635) Weight:  [75.5 kg] 75.5 kg (01/11 0657) Estimated body mass index is 30.45 kg/m as calculated from the following:   Height as of this encounter: 5\' 2"  (1.575 m).   Weight as of this encounter: 75.5 kg.   General Appearance: Alert Head: Normocephalic, without obvious abnormality, atraumatic Eyes: PERRL, conjunctiva/corneas clear, EOM's intact,    Ears: Normal  Throat: Normal  Neck: Supple, Back: The patient's lumbar incision is well-healed. Lungs: Clear to auscultation bilaterally, respirations unlabored Heart: Regular rate and rhythm, no murmur, rub or gallop Abdomen: Soft,  non-tender Extremities: Extremities normal, atraumatic, no cyanosis or edema Skin: unremarkable  NEUROLOGIC:   Mental status: alert and oriented,Motor Exam - grossly normal Sensory Exam - grossly normal Reflexes:  Coordination - grossly normal Gait - grossly normal Balance - grossly normal Cranial Nerves: I: smell Not tested  II: visual acuity  OS: Normal  OD: Normal   II: visual fields Full to confrontation  II: pupils Equal, round, reactive to light  III,VII: ptosis None  III,IV,VI: extraocular muscles  Full ROM  V: mastication Normal  V: facial light touch sensation  Normal  V,VII: corneal reflex  Present  VII: facial muscle function - upper  Normal  VII: facial muscle function - lower Normal  VIII: hearing Not tested  IX: soft palate elevation  Normal  IX,X: gag reflex Present  XI: trapezius strength  5/5  XI: sternocleidomastoid strength 5/5  XI: neck flexion strength  5/5  XII: tongue strength  Normal    Data Review Lab Results  Component Value Date   WBC 9.4 02/10/2021   HGB 13.2 02/10/2021   HCT 39.7 02/10/2021   MCV 93.4 02/10/2021   PLT 359 02/10/2021   Lab Results  Component Value Date   NA 137 02/10/2021   K 3.5 02/10/2021   CL 98 02/10/2021   CO2 29 02/10/2021   BUN 16 02/10/2021   CREATININE 0.69 02/10/2021   GLUCOSE 116 (H) 02/10/2021   No results found for: INR, PROTIME  Assessment/Plan: 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  I reviewed her imaging studies with her and pointed out the abnormalities.  We have discussed the various treatment options including surgery.  I described surgical treatment option of an exploration of her lumbar fusion with extension of decompression instrumentation fusion at L2-3.  I have shown her surgical models.  I have given her a surgical pamphlet.  We have discussed the risk, benefits, alternatives, expected postop course, and  likelihood of achieving our goals with surgery.  I have answered all her questions.  She has decided proceed with surgery.   Ophelia Charter 02/12/2021 8:24 AM

## 2021-02-12 NOTE — Op Note (Signed)
Brief history: The patient is a 81 year old white female on whom I previously performed an L3-4 and L4-5 decompression, instrumentation and fusion.  She has done well until recently when she developed recurrent back and right leg pain.  She failed medical management and was worked up with a lumbar MRI which demonstrated she has severe stenosis at L2-3.  I discussed the various treatment options with her.  She has decided proceed with surgery.  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 Etc. requiring a wide decompression of the nerve roots.);  L2-3 transforaminal lumbar interbody fusion with local morselized autograft bone and Zimmer DBM; insertion of interbody prosthesis at L2-3 (globus peek expandable interbody prosthesis); posterior segmental instrumentation from L2 to L5 with globus titanium pedicle screws and rods; posterior lateral arthrodesis at L2-3 and redo posterior lateral arthrodesis at L3-4 with local morselized autograft bone and Trinity bone graft .  Surgeon: Dr. Earle Gell  Asst.: Dr. Ashok Pall and Arnetha Massy, NP  Anesthesia: Gen. endotracheal  Estimated blood loss: 200 cc  Drains: None  Complications: None  Description of procedure: The patient was brought to the operating room by the anesthesia team. General endotracheal anesthesia was induced. The patient was turned to the prone position on the Wilson frame. The patient's lumbosacral region was then prepared with Betadine scrub and Betadine  solution. Sterile drapes were applied.  I then injected the area to be incised with Marcaine with epinephrine solution. I then used the scalpel to make a linear midline incision over the L2-3, L3-4 and L4-5 interspace. I then used electrocautery to perform a bilateral subperiosteal dissection exposing the spinous process and lamina of 2 and L5, and exposing the old hardware at L3-4 and L4-5. We then inserted the Verstrac retractor to provide exposure.  We explored the fusion by removing the caps from the old screws at L3-4 and L4-5.  We then remove the rods.  I inspected the arthrodesis.  I did could not identify a posterior lateral arthrodesis at L3-4 so I decided to perform a redo posterior lateral arthrodesis at this level.  I began the decompression by using the high speed drill to perform laminotomies at L2-3 bilaterally. We then used the Kerrison punches to widen the laminotomy and removed the ligamentum flavum at L2-3 bilaterally. We used the Kerrison punches to remove the medial facets at L2-3 bilaterally. We performed wide foraminotomies about the bilateral L2 and L3 nerve roots completing the decompression.  We now turned our attention to the posterior lumbar interbody fusion. I used a scalpel to incise the intervertebral disc at L2-3 bilaterally. I then performed a partial intervertebral discectomy at L2-3 bilaterally using the pituitary forceps. We prepared the vertebral endplates at A3-5 bilaterally for the fusion by removing the soft tissues with the curettes. We then used the trial spacers to pick the appropriate sized interbody prosthesis. We prefilled his prosthesis with a combination of local morselized autograft bone that we obtained during the decompression as well as Zimmer DBM. We inserted the prefilled prosthesis into the interspace at L2-3 from the right, we then turned and expanded the prosthesis. There was  a good snug fit of the prosthesis in the interspace. We then filled and the  remainder of the intervertebral disc space with local morselized autograft bone and Zimmer DBM. This completed the posterior lumbar interbody arthrodesis.  During the decompression and insertion of the prosthesis the assistant protected the thecal sac and nerve roots with the D'Errico retractor.  We now turned attention to the instrumentation. Under fluoroscopic guidance we cannulated the bilateral L2 pedicles with the bone probe. We then removed the bone probe. We then tapped the pedicle with a 6.5 millimeter tap. We then removed the tap. We probed inside the tapped pedicle with a ball probe to rule out cortical breaches. We then inserted a 7.5 x 50 millimeter pedicle screw into the L2 pedicles bilaterally under fluoroscopic guidance. We then palpated along the medial aspect of the pedicles to rule out cortical breaches. There were none. The nerve roots were not injured. We then connected the unilateral pedicle screws with a lordotic rod. We compressed the construct and secured the rod in place with the caps. We then tightened the caps appropriately. This completed the instrumentation from L2-L5 bilaterally.  We now turned our attention to the posterior lateral arthrodesis at L2-3 and the redo posterior lateral arthrodesis at L3-4.  We cleared the soft tissue from the remainder of the facets, transverse process, etc. at L3-4 we used the high-speed drill to decorticate the remainder of the facets, pars, transverse process at L2-3 and L3-4. We then applied a combination of local morselized autograft bone and Zimmer DBM over these decorticated posterior lateral structures. This completed the posterior lateral arthrodesis.  We then obtained hemostasis using bipolar electrocautery. We irrigated the wound out with saline solution. We inspected the thecal sac and nerve roots and noted they were well decompressed. We then removed the retractor.   We reapproximated patient's thoracolumbar fascia with interrupted #1  Vicryl suture. We reapproximated patient's subcutaneous tissue with interrupted 2-0 Vicryl suture. The reapproximated patient's skin with Steri-Strips and benzoin. The wound was then coated with bacitracin ointment. A sterile dressing was applied. The drapes were removed. The patient was subsequently returned to the supine position where they were extubated by the anesthesia team. He was then transported to the post anesthesia care unit in stable condition. All sponge instrument and needle counts were reportedly correct at the end of this case.

## 2021-02-12 NOTE — Anesthesia Procedure Notes (Signed)
Procedure Name: Intubation Date/Time: 02/12/2021 8:42 AM Performed by: Erick Colace, CRNA Pre-anesthesia Checklist: Patient identified, Emergency Drugs available, Suction available and Patient being monitored Patient Re-evaluated:Patient Re-evaluated prior to induction Oxygen Delivery Method: Circle system utilized Preoxygenation: Pre-oxygenation with 100% oxygen Induction Type: IV induction Ventilation: Mask ventilation without difficulty Laryngoscope Size: Glidescope and 3 Grade View: Grade I Tube type: Oral Number of attempts: 1 Airway Equipment and Method: Stylet and Oral airway Placement Confirmation: ETT inserted through vocal cords under direct vision, positive ETCO2 and breath sounds checked- equal and bilateral Secured at: 21 cm Tube secured with: Tape Dental Injury: Teeth and Oropharynx as per pre-operative assessment  Comments: Elective glidescope due to pevious grade 3 view and poor neck range of motion.

## 2021-02-13 LAB — CBC
HCT: 31.7 % — ABNORMAL LOW (ref 36.0–46.0)
Hemoglobin: 11 g/dL — ABNORMAL LOW (ref 12.0–15.0)
MCH: 32 pg (ref 26.0–34.0)
MCHC: 34.7 g/dL (ref 30.0–36.0)
MCV: 92.2 fL (ref 80.0–100.0)
Platelets: 317 10*3/uL (ref 150–400)
RBC: 3.44 MIL/uL — ABNORMAL LOW (ref 3.87–5.11)
RDW: 13.2 % (ref 11.5–15.5)
WBC: 15.9 10*3/uL — ABNORMAL HIGH (ref 4.0–10.5)
nRBC: 0 % (ref 0.0–0.2)

## 2021-02-13 LAB — BASIC METABOLIC PANEL
Anion gap: 11 (ref 5–15)
BUN: 14 mg/dL (ref 8–23)
CO2: 29 mmol/L (ref 22–32)
Calcium: 9.2 mg/dL (ref 8.9–10.3)
Chloride: 96 mmol/L — ABNORMAL LOW (ref 98–111)
Creatinine, Ser: 0.84 mg/dL (ref 0.44–1.00)
GFR, Estimated: >60 mL/min (ref >60)
Glucose, Bld: 120 mg/dL — ABNORMAL HIGH (ref 70–99)
Potassium: 3.6 mmol/L (ref 3.5–5.1)
Sodium: 136 mmol/L (ref 135–145)

## 2021-02-13 MED ORDER — OXYCODONE-ACETAMINOPHEN 5-325 MG PO TABS: 1.0000 | ORAL_TABLET | ORAL | 0 refills | Status: DC | PRN

## 2021-02-13 MED ORDER — CYCLOBENZAPRINE HCL 10 MG PO TABS: 10.0000 mg | ORAL_TABLET | Freq: Three times a day (TID) | ORAL | 0 refills | Status: DC | PRN

## 2021-02-13 MED ORDER — DOCUSATE SODIUM 100 MG PO CAPS: 100.0000 mg | ORAL_CAPSULE | Freq: Two times a day (BID) | ORAL | 0 refills | Status: DC

## 2021-02-13 NOTE — Evaluation (Signed)
Physical Therapy Evaluation Patient Details Name: Michelle [name] MRN: [id number] DOB: May 20, 1940 Today's Date: 02/13/2021  History of Present Illness  Pt is a 81 y/o female who presents s/p L2-L3 PLIF on 02/12/2021. PMH significant for previous spinal surgery ~2012 & 2014, OA, HTN.   Clinical Impression  Pt admitted with above diagnosis. At the time of PT eval, pt was able to demonstrate transfers and ambulation with gross min guard assist to supervision for safety and RW for support. Pt was educated on precautions, brace application/wearing schedule, appropriate activity progression, and car transfer. Pt currently with functional limitations due to the deficits listed below (see PT Problem List). Pt will benefit from skilled PT to increase their independence and safety with mobility to allow discharge to the venue listed below.          Recommendations for follow up therapy are one component of a multi-disciplinary discharge planning process, led by the attending physician.  Recommendations may be updated based on patient status, additional functional criteria and insurance authorization.  Follow Up Recommendations No PT follow up    Assistance Recommended at Discharge PRN  Patient can return home with the following  A little help with walking and/or transfers;A little help with bathing/dressing/bathroom;Assist for transportation;Help with stairs or ramp for entrance    Equipment Recommendations None recommended by PT  Recommendations for Other Services       Functional Status Assessment Patient has had a recent decline in their functional status and demonstrates the ability to make significant improvements in function in a reasonable and predictable amount of time.     Precautions / Restrictions Precautions Precautions: Back Precaution Booklet Issued: Yes (comment) Precaution Comments: Written and verbal education provided. Good return demo during ADLs. Required Braces or Orthoses:  Spinal Brace Restrictions Weight Bearing Restrictions: No      Mobility  Bed Mobility Overal bed mobility: Needs Assistance Bed Mobility: Rolling;Sidelying to Sit Rolling: Supervision Sidelying to sit: Min guard       General bed mobility comments: Increased time and effort due to pain. No physical assist however closer guard provided for safety. VC's throughout for optimal sequencing and technique. HOB flat and rails lowered to simulate home environment.    Transfers Overall transfer level: Needs assistance Equipment used: None;Rolling walker (2 wheels) Transfers: Sit to/from Stand Sit to Stand: Min guard           General transfer comment: Close guard for safety as pt powered up to full stand. Increased stability with RW.    Ambulation/Gait Ambulation/Gait assistance: Min guard Gait Distance (Feet): 300 Feet Assistive device: Rolling walker (2 wheels) Gait Pattern/deviations: Step-through pattern;Decreased stride length;Trunk flexed Gait velocity: Decreased Gait velocity interpretation: <1.31 ft/sec, indicative of household ambulator   General Gait Details: VC's for improved posture, closer walker proximity and forward gaze. No overt LOB with the RW. Appears more steady and confident with the RW vs HHA.  Stairs Stairs: Yes Stairs assistance: Min guard Stair Management: No rails;One rail Right;Forwards Number of Stairs: 2 (x2) General stair comments: VC's for sequencing and general safety. Daughter present for education and assisted pt with HHA on the L, with PT guarding both pt and daughter.  Wheelchair Mobility    Modified Rankin (Stroke Patients Only)       Balance Overall balance assessment: Mild deficits observed, not formally tested  Pertinent Vitals/Pain Pain Assessment: Faces Faces Pain Scale: Hurts a little bit Pain Location: Low back (incisional) Pain Descriptors / Indicators:  Grimacing;Guarding Pain Intervention(s): Limited activity within patient's tolerance;Monitored during session;Repositioned    Home Living Family/patient expects to be discharged to:: Private residence Living Arrangements: Alone Available Help at Discharge: Family;Available 24 hours/day (Daughter to stay with her for the next several days.) Type of Home: House Home Access: Stairs to enter Entrance Stairs-Rails: None Entrance Stairs-Number of Steps: 2 from garage   Home Layout: Two level;Able to live on main level with bedroom/bathroom Home Equipment: Conservation officer, nature (2 wheels)      Prior Function Prior Level of Function : Independent/Modified Independent;Driving                     Hand Dominance   Dominant Hand: Right    Extremity/Trunk Assessment   Upper Extremity Assessment Upper Extremity Assessment: Defer to OT evaluation    Lower Extremity Assessment Lower Extremity Assessment: Generalized weakness (Consistent with pre-op diagnosis.)    Cervical / Trunk Assessment Cervical / Trunk Assessment: Back Surgery  Communication   Communication: No difficulties  Cognition Arousal/Alertness: Awake/alert Behavior During Therapy: WFL for tasks assessed/performed Overall Cognitive Status: Within Functional Limits for tasks assessed                                          General Comments      Exercises     Assessment/Plan    PT Assessment Patient needs continued PT services  PT Problem List Decreased strength;Decreased activity tolerance;Decreased balance;Decreased mobility;Decreased knowledge of use of DME;Decreased safety awareness;Decreased knowledge of precautions;Pain       PT Treatment Interventions DME instruction;Gait training;Stair training;Functional mobility training;Therapeutic activities;Therapeutic exercise;Neuromuscular re-education;Patient/family education    PT Goals (Current goals can be found in the Care Plan section)   Acute Rehab PT Goals Patient Stated Goal: Home today; back to walking down the driveway and walking for exercise. PT Goal Formulation: With patient/family Time For Goal Achievement: 02/20/21 Potential to Achieve Goals: Good    Frequency Min 5X/week     Co-evaluation               AM-PAC PT "6 Clicks" Mobility  Outcome Measure Help needed turning from your back to your side while in a flat bed without using bedrails?: None Help needed moving from lying on your back to sitting on the side of a flat bed without using bedrails?: A Little Help needed moving to and from a bed to a chair (including a wheelchair)?: A Little Help needed standing up from a chair using your arms (e.g., wheelchair or bedside chair)?: A Little Help needed to walk in hospital room?: A Little Help needed climbing 3-5 steps with a railing? : A Little 6 Click Score: 19    End of Session Equipment Utilized During Treatment: Gait belt;Back brace Activity Tolerance: Patient tolerated treatment well Patient left: in chair;with call bell/phone within reach;with family/visitor present Nurse Communication: Mobility status PT Visit Diagnosis: Unsteadiness on feet (R26.81);Pain Pain - part of body:  (back)    Time: 0626-9485 PT Time Calculation (min) (ACUTE ONLY): 36 min   Charges:   PT Evaluation $PT Eval Low Complexity: 1 Low PT Treatments $Gait Training: 8-22 mins        Rolinda Roan, PT, DPT Acute Rehabilitation Services Pager: [phone] Office: [phone]   Mickel Baas  D Sharni Negron 02/13/2021, 2:03 PM

## 2021-02-13 NOTE — Progress Notes (Signed)
Pt doing well. Pt and daughter given D/C instructions with verbal understanding. Rx's were sent to the pharmacy by MD. Pt's incision is clean and dry with no sign of infection. Pt's IV was removed prior to D/C. Pt D/C'd home via wheelchair per MD order. Pt is stable @ D/C and has no other needs at this time. Florella Mcneese, RN  

## 2021-02-13 NOTE — Progress Notes (Signed)
Subjective: The patient is alert and pleasant.  She is in no apparent distress.  She looks well.  Objective: Vital signs in last 24 hours: Temp:  [97 F (36.1 C)-99.4 F (37.4 C)] 98.8 F (37.1 C) (01/12 0423) Pulse Rate:  [64-85] 85 (01/12 0423) Resp:  [11-18] 18 (01/12 0423) BP: (115-145)/(51-78) 115/60 (01/12 0423) SpO2:  [95 %-100 %] 97 % (01/12 0423) Estimated body mass index is 30.45 kg/m as calculated from the following:   Height as of this encounter: 5\' 2"  (1.575 m).   Weight as of this encounter: 75.5 kg.   Intake/Output from previous day: 01/11 0701 - 01/12 0700 In: 1400 [I.V.:1150; IV Piggyback:250] Out: 665 [Urine:565; Blood:100] Intake/Output this shift: No intake/output data recorded.  Physical exam the patient is alert and oriented.  Her lower extremity strength is grossly normal.  Lab Results: Recent Labs    02/10/21 0922 02/13/21 0607  WBC 9.4 15.9*  HGB 13.2 11.0*  HCT 39.7 31.7*  PLT 359 317   BMET Recent Labs    02/10/21 0922 02/13/21 0607  NA 137 136  K 3.5 3.6  CL 98 96*  CO2 29 29  GLUCOSE 116* 120*  BUN 16 14  CREATININE 0.69 0.84  CALCIUM 9.7 9.2    Studies/Results: DG Lumbar Spine 2-3 Views  Result Date: 02/12/2021 CLINICAL DATA:  L2-L3 PLIF EXAM: LUMBAR SPINE - 2-3 VIEW COMPARISON:  Lumbar spine radiographs 02/08/2012 FINDINGS: Intraoperative images during lumbar fusion revision, with new posterior and interbody fusion at L2-L3. The posterior fusion rod has not yet been placed on the last image. IMPRESSION: Intraoperative images during lumbar fusion revision, extension to L2-L3 with new posterior and interbody fusion. Electronically Signed   By: Maurine Simmering M.D.   On: 02/12/2021 13:06   DG C-Arm 1-60 Min-No Report  Result Date: 02/12/2021 Fluoroscopy was utilized by the requesting physician.  No radiographic interpretation.   DG C-Arm 1-60 Min-No Report  Result Date: 02/12/2021 Fluoroscopy was utilized by the requesting  physician.  No radiographic interpretation.    Assessment/Plan: Postop day #1: The patient is doing well.  We will likely discharge her after she works with PT and OT.  I gave her discharge instructions and answered all her questions.  LOS: 1 day     Ophelia Charter 02/13/2021, 7:56 AM     Patient ID: Michelle [name], female   DOB: November 03, 1940, 81 y.o.   MRN: [id number]

## 2021-02-13 NOTE — Discharge Instructions (Signed)
Wound Care Keep incision covered and dry for two days.    Do not put any creams, lotions, or ointments on incision. Leave steri-strips on back.  They will fall off by themselves.  Activity Walk each and every day, increasing distance each day. No lifting greater than 5 lbs.  Avoid excessive back motion. No driving for 2 weeks; may ride as a passenger locally.  Diet Resume your normal diet.   Return to Work Will be discussed at your follow up appointment.  Call Your Doctor If Any of These Occur Redness, drainage, or swelling at the wound.  Temperature greater than 101 degrees. Severe pain not relieved by pain medication. Incision starts to come apart.  Follow Up Appt If you have any hardware placed in your spine, you will need an x-ray before your appointment.

## 2021-02-13 NOTE — Discharge Summary (Signed)
Physician Discharge Summary     Providing Compassionate, Quality Care - Together   Patient ID: STEPHENI CAMERON MRN: [id number] DOB/AGE: [birth year] 80 y.o.  Admit date: 02/12/2021 Discharge date: 02/13/2021  Admission Diagnoses:   Spinal stenosis of lumbar region with neurogenic claudication  Discharge Diagnoses:  Principal Problem:   Spinal stenosis of lumbar region with neurogenic claudication   Discharged Condition: good  Hospital Course: Patient underwent an  L2-3 PLIF  by Dr. Arnoldo Morale on  02/12/2021.  she was admitted to  3C07 following recovery from anesthesia in the PACU. Her postoperative course has been uncomplicated. She has worked with both physical and occupational therapies who feel the patient is ready for discharge home. She is ambulating independently and without difficulty. She is tolerating a normal diet. She is not having any bowel or bladder dysfunction. Her pain is well-controlled with oral pain medication. She is ready for discharge home.   Consults: rehabilitation medicine  Significant Diagnostic Studies: radiology: DG Lumbar Spine 2-3 Views  Result Date: 02/12/2021 CLINICAL DATA:  L2-L3 PLIF EXAM: LUMBAR SPINE - 2-3 VIEW COMPARISON:  Lumbar spine radiographs 02/08/2012 FINDINGS: Intraoperative images during lumbar fusion revision, with new posterior and interbody fusion at L2-L3. The posterior fusion rod has not yet been placed on the last image. IMPRESSION: Intraoperative images during lumbar fusion revision, extension to L2-L3 with new posterior and interbody fusion. Electronically Signed   By: Maurine Simmering M.D.   On: 02/12/2021 13:06   DG C-Arm 1-60 Min-No Report  Result Date: 02/12/2021 Fluoroscopy was utilized by the requesting physician.  No radiographic interpretation.   DG C-Arm 1-60 Min-No Report  Result Date: 02/12/2021 Fluoroscopy was utilized by the requesting physician.  No radiographic interpretation.     Treatments: surgery:  Bilateral L2-3  laminotomy/foraminotomies/medial facetectomy to decompress the bilateral L2 and L3 nerve roots(the work required to do this was in addition to the work required to do the posterior lumbar interbody fusion because of the patient's spinal stenosis, facet arthropathy. Etc. requiring a wide decompression of the nerve roots.);  L2-3 transforaminal lumbar interbody fusion with local morselized autograft bone and Zimmer DBM; insertion of interbody prosthesis at L2-3 (globus peek expandable interbody prosthesis); posterior segmental instrumentation from L2 to L5 with globus titanium pedicle screws and rods; posterior lateral arthrodesis at L2-3 and redo posterior lateral arthrodesis at L3-4 with local morselized autograft bone and Trinity bone graft .  Discharge Exam: Blood pressure (!) 110/55, pulse 77, temperature 99.1 F (37.3 C), temperature source Oral, resp. rate 16, height 5\' 2"  (1.575 m), weight 75.5 kg, last menstrual period 04/19/1997, SpO2 96 %.  Per report: Alert and oriented x 4 PERRLA CN II-XII grossly intact MAE, Strength and sensation intact Incision is covered with Honeycomb dressing and Steri Strips; Dressing is clean, dry, and intact   Disposition: Discharge disposition: 01-Home or Self Care        Allergies as of 02/13/2021   No Known Allergies      Medication List     STOP taking these medications    methotrexate 2.5 MG tablet Commonly known as: RHEUMATREX       TAKE these medications    acetaminophen 500 MG tablet Commonly known as: TYLENOL Take 1,000 mg by mouth every 6 (six) hours as needed.   chlorthalidone 25 MG tablet Commonly known as: HYGROTON TAKE 1/2 - 1 TABLET BY MOUTH EVERY DAY AS DIRECTED.   cyclobenzaprine 10 MG tablet Commonly known as: FLEXERIL Take 1 tablet (10 mg total)  by mouth 3 (three) times daily as needed for muscle spasms.   diclofenac Sodium 1 % Gel Commonly known as: VOLTAREN Apply 2 g topically daily as needed (pain).    docusate sodium 100 MG capsule Commonly known as: COLACE Take 1 capsule (100 mg total) by mouth 2 (two) times daily.   folic acid 1 MG tablet Commonly known as: FOLVITE Take 1 mg by mouth daily.   losartan 100 MG tablet Commonly known as: COZAAR TAKE 1 TABLET BY MOUTH EVERY DAY   metoprolol succinate 50 MG 24 hr tablet Commonly known as: TOPROL-XL TAKE 1 TABLET BY MOUTH EVERY DAY   multivitamin tablet Take 1 tablet by mouth daily.   oxyCODONE-acetaminophen 5-325 MG tablet Commonly known as: PERCOCET/ROXICET Take 1-2 tablets by mouth every 4 (four) hours as needed for severe pain.   PreserVision AREDS Tabs Take 1 tablet by mouth 2 (two) times daily.   vitamin C 250 MG tablet Commonly known as: ASCORBIC ACID Take 250 mg by mouth daily.   Vitamin D 50 MCG (2000 UT) Caps Take 2,000 Units by mouth daily.        Follow-up Information     Newman Pies, MD. Go on 03/11/2021.   Specialty: Neurosurgery Why: First postop appointment is at 1:45 p.m. on 03/11/2021. Contact information: [address] [place] [address] [phone]                 Signed: Viona Gilmore, DNP, AGNP-C Nurse Practitioner  [facility] [place] [address], [place], [place], [place] [address] P: [phone]     F: [phone]  02/13/2021, 1:14 PM

## 2021-02-13 NOTE — Evaluation (Signed)
Occupational Therapy Evaluation Patient Details Name: Michelle [name] MRN: [id number] DOB: 03-21-1940 Today's Date: 02/13/2021   History of Present Illness 81 y.o. female presenting for elective L2-3 PLIF. PMHx significant for previous spinal surgery ~2012 & 2014, OA and HTN.   Clinical Impression   PTA patient was living alone in a 2-level private residence with bedroom/bathroom on main level and was independent with ADLs/IADLs without AD. Patient currently presents near baseline level of function demonstrating observed ADLs with supervision A. OT provided education on spinal precautions, home set-up to maximize safety and independence with self-care tasks, and acquisition/use of AE. Patient expressed verbal understanding. Patient does not require continued acute occupational therapy services with OT to sign off at this time. Reports daughter plans to stay with her for the next several days.      Recommendations for follow up therapy are one component of a multi-disciplinary discharge planning process, led by the attending physician.  Recommendations may be updated based on patient status, additional functional criteria and insurance authorization.   Follow Up Recommendations  No OT follow up    Assistance Recommended at Discharge Intermittent Supervision/Assistance  Patient can return home with the following      Functional Status Assessment  Patient has had a recent decline in their functional status and demonstrates the ability to make significant improvements in function in a reasonable and predictable amount of time.  Equipment Recommendations  BSC/3in1    Recommendations for Other Services       Precautions / Restrictions Precautions Precautions: Back Precaution Booklet Issued: Yes (comment) Precaution Comments: Written and verbal education provided. Good return demo during ADLs. Required Braces or Orthoses: Spinal Brace Restrictions Weight Bearing Restrictions: No       Mobility Bed Mobility Overal bed mobility: Modified Independent                  Transfers Overall transfer level: Needs assistance Equipment used: None Transfers: Sit to/from Stand Sit to Stand: Supervision           General transfer comment: Supervision A for safety.      Balance Overall balance assessment: Mild deficits observed, not formally tested                                         ADL either performed or assessed with clinical judgement   ADL Overall ADL's : Needs assistance/impaired     Grooming: Supervision/safety;Standing   Upper Body Bathing: Supervision/ safety;Sitting   Lower Body Bathing: Supervison/ safety;Sit to/from stand   Upper Body Dressing : Set up;Sitting   Lower Body Dressing: Supervision/safety;Sit to/from stand                       Vision Baseline Vision/History: 1 Wears glasses Ability to See in Adequate Light: 0 Adequate Patient Visual Report: No change from baseline Vision Assessment?: No apparent visual deficits     Perception     Praxis      Pertinent Vitals/Pain Pain Assessment: 0-10 Pain Score: 3  Pain Location: Low back (incisional) Pain Descriptors / Indicators: Grimacing;Guarding Pain Intervention(s): Limited activity within patient's tolerance;Monitored during session;Premedicated before session;Repositioned     Hand Dominance Right   Extremity/Trunk Assessment Upper Extremity Assessment Upper Extremity Assessment: Overall WFL for tasks assessed   Lower Extremity Assessment Lower Extremity Assessment: Defer to PT evaluation   Cervical / Trunk Assessment  Cervical / Trunk Assessment: Back Surgery   Communication Communication Communication: No difficulties   Cognition Arousal/Alertness: Awake/alert Behavior During Therapy: WFL for tasks assessed/performed Overall Cognitive Status: Within Functional Limits for tasks assessed                                        General Comments  Clean/dry dressing at incision.    Exercises     Shoulder Instructions      Home Living Family/patient expects to be discharged to:: Private residence Living Arrangements: Alone Available Help at Discharge: Family;Available 24 hours/day (Daughter to stay with her for the next several days.) Type of Home: House Home Access: Stairs to enter [employer] of Steps: 2 from garage Entrance Stairs-Rails: None Home Layout: Two level;Able to live on main level with bedroom/bathroom     Bathroom Shower/Tub: Teacher, early years/pre: Standard     Home Equipment: Conservation officer, nature (2 wheels)          Prior Functioning/Environment Prior Level of Function : Independent/Modified Independent;Driving                        OT Problem List: Pain;Impaired balance (sitting and/or standing)      OT Treatment/Interventions:      OT Goals(Current goals can be found in the care plan section) Acute Rehab OT Goals Patient Stated Goal: To return home today. OT Goal Formulation: With patient  OT Frequency:      Co-evaluation              AM-PAC OT "6 Clicks" Daily Activity     Outcome Measure Help from another person eating meals?: None Help from another person taking care of personal grooming?: None Help from another person toileting, which includes using toliet, bedpan, or urinal?: A Little Help from another person bathing (including washing, rinsing, drying)?: A Little Help from another person to put on and taking off regular upper body clothing?: A Little Help from another person to put on and taking off regular lower body clothing?: A Little 6 Click Score: 20   End of Session Equipment Utilized During Treatment: Back brace Nurse Communication: Mobility status  Activity Tolerance: Patient tolerated treatment well Patient left: in chair;with call bell/phone within reach  OT Visit Diagnosis: Unsteadiness on feet  (R26.81);Pain Pain - part of body:  (Low back)                Time: 1610-9604 OT Time Calculation (min): 20 min Charges:  OT General Charges $OT Visit: 1 Visit OT Evaluation $OT Eval Low Complexity: 1 Low  Michelle [name] H. OTR/L Supplemental OT, Department of rehab services [phone]  Michelle [name] R H. 02/13/2021, 8:33 AM

## 2021-02-15 DIAGNOSIS — I10 Essential (primary) hypertension: Secondary | ICD-10-CM | POA: Diagnosis not present

## 2021-02-15 DIAGNOSIS — Z48811 Encounter for surgical aftercare following surgery on the nervous system: Secondary | ICD-10-CM | POA: Diagnosis not present

## 2021-02-18 DIAGNOSIS — Z48811 Encounter for surgical aftercare following surgery on the nervous system: Secondary | ICD-10-CM | POA: Diagnosis not present

## 2021-02-18 DIAGNOSIS — I10 Essential (primary) hypertension: Secondary | ICD-10-CM | POA: Diagnosis not present

## 2021-02-20 DIAGNOSIS — I10 Essential (primary) hypertension: Secondary | ICD-10-CM | POA: Diagnosis not present

## 2021-02-20 DIAGNOSIS — Z48811 Encounter for surgical aftercare following surgery on the nervous system: Secondary | ICD-10-CM | POA: Diagnosis not present

## 2021-02-21 DIAGNOSIS — I10 Essential (primary) hypertension: Secondary | ICD-10-CM | POA: Diagnosis not present

## 2021-02-21 DIAGNOSIS — Z48811 Encounter for surgical aftercare following surgery on the nervous system: Secondary | ICD-10-CM | POA: Diagnosis not present

## 2021-02-24 DIAGNOSIS — I10 Essential (primary) hypertension: Secondary | ICD-10-CM | POA: Diagnosis not present

## 2021-02-24 DIAGNOSIS — Z48811 Encounter for surgical aftercare following surgery on the nervous system: Secondary | ICD-10-CM | POA: Diagnosis not present

## 2021-02-25 ENCOUNTER — Telehealth: Payer: Self-pay | Admitting: Internal Medicine

## 2021-02-25 NOTE — Telephone Encounter (Signed)
Spoke with patient to schedule Medicare Annual Wellness Visit (AWV) either virtually or in office.   She wanted call back in a month  just had surgery    Last AWV 01/09/20 ; please schedule at anytime with [facility] Nurse Health Advisor 1 or 2   This should be a 45 minute visit.

## 2021-02-28 DIAGNOSIS — Z48811 Encounter for surgical aftercare following surgery on the nervous system: Secondary | ICD-10-CM | POA: Diagnosis not present

## 2021-02-28 DIAGNOSIS — I10 Essential (primary) hypertension: Secondary | ICD-10-CM | POA: Diagnosis not present

## 2021-03-03 DIAGNOSIS — I10 Essential (primary) hypertension: Secondary | ICD-10-CM | POA: Diagnosis not present

## 2021-03-03 DIAGNOSIS — Z48811 Encounter for surgical aftercare following surgery on the nervous system: Secondary | ICD-10-CM | POA: Diagnosis not present

## 2021-03-04 DIAGNOSIS — H353211 Exudative age-related macular degeneration, right eye, with active choroidal neovascularization: Secondary | ICD-10-CM | POA: Diagnosis not present

## 2021-03-04 DIAGNOSIS — H353122 Nonexudative age-related macular degeneration, left eye, intermediate dry stage: Secondary | ICD-10-CM | POA: Diagnosis not present

## 2021-03-04 DIAGNOSIS — H35373 Puckering of macula, bilateral: Secondary | ICD-10-CM | POA: Diagnosis not present

## 2021-03-05 DIAGNOSIS — I10 Essential (primary) hypertension: Secondary | ICD-10-CM | POA: Diagnosis not present

## 2021-03-05 DIAGNOSIS — Z48811 Encounter for surgical aftercare following surgery on the nervous system: Secondary | ICD-10-CM | POA: Diagnosis not present

## 2021-03-11 DIAGNOSIS — I10 Essential (primary) hypertension: Secondary | ICD-10-CM | POA: Diagnosis not present

## 2021-03-11 DIAGNOSIS — Z6830 Body mass index (BMI) 30.0-30.9, adult: Secondary | ICD-10-CM | POA: Diagnosis not present

## 2021-03-11 DIAGNOSIS — M48062 Spinal stenosis, lumbar region with neurogenic claudication: Secondary | ICD-10-CM | POA: Diagnosis not present

## 2021-03-26 ENCOUNTER — Telehealth: Payer: Self-pay | Admitting: Internal Medicine

## 2021-03-26 NOTE — Telephone Encounter (Signed)
Left message for patient to call back and schedule Medicare Annual Wellness Visit (AWV) either virtually or in office. Left  my Michelle [name] number [phone]   Last AWV 01/09/20  please schedule at anytime with [facility] Nurse Health Advisor 1 or 2   This should be a 45 minute visit.

## 2021-04-04 DIAGNOSIS — Z20822 Contact with and (suspected) exposure to covid-19: Secondary | ICD-10-CM | POA: Diagnosis not present

## 2021-04-07 ENCOUNTER — Ambulatory Visit (INDEPENDENT_AMBULATORY_CARE_PROVIDER_SITE_OTHER): Payer: Medicare Other

## 2021-04-07 VITALS — Ht 62.0 in | Wt 165.0 lb

## 2021-04-07 DIAGNOSIS — Z0000 Encounter for general adult medical examination without abnormal findings: Secondary | ICD-10-CM | POA: Diagnosis not present

## 2021-04-07 NOTE — Patient Instructions (Addendum)
Michelle [name] , Thank you for taking time to come for your Medicare Wellness Visit. I appreciate your ongoing commitment to your health goals. Please review the following plan we discussed and let me know if I can assist you in the future.   These are the goals we discussed:  Goals       Patient Stated (pt-stated)      Lose weight.         This is a list of the screening recommended for you and due dates:  Health Maintenance  Topic Date Due   Zoster (Shingles) Vaccine (1 of 2) 07/08/2021*   Tetanus Vaccine  04/08/2022*   Hepatitis C Screening: USPSTF Recommendation to screen - Ages 18-79 yo.  04/08/2022*   Pneumonia Vaccine  Completed   Flu Shot  Completed   DEXA scan (bone density measurement)  Completed   COVID-19 Vaccine  Completed   HPV Vaccine  Aged Out  *Topic was postponed. The date shown is not the original due date.    Advanced directives: Yes   Conditions/risks identified: None  Next appointment: Follow up in one year for your annual wellness visit    Preventive Care 65 Years and Older, Female Preventive care refers to lifestyle choices and visits with your health care provider that can promote health and wellness. What does preventive care include? A yearly physical exam. This is also called an annual well check. Dental exams once or twice a year. Routine eye exams. Ask your health care provider how often you should have your eyes checked. Personal lifestyle choices, including: Daily care of your teeth and gums. Regular physical activity. Eating a healthy diet. Avoiding tobacco and drug use. Limiting alcohol use. Practicing safe sex. Taking low-dose aspirin every day. Taking vitamin and mineral supplements as recommended by your health care provider. What happens during an annual well check? The services and screenings done by your health care provider during your annual well check will depend on your age, overall health, lifestyle risk factors, and family  history of disease. Counseling  Your health care provider may ask you questions about your: Alcohol use. Tobacco use. Drug use. Emotional well-being. Home and relationship well-being. Sexual activity. Eating habits. History of falls. Memory and ability to understand (cognition). Work and work Statistician. Reproductive health. Screening  You may have the following tests or measurements: Height, weight, and BMI. Blood pressure. Lipid and cholesterol levels. These may be checked every 5 years, or more frequently if you are over 82 years old. Skin check. Lung cancer screening. You may have this screening every year starting at age 64 if you have a 30-pack-year history of smoking and currently smoke or have quit within the past 15 years. Fecal occult blood test (FOBT) of the stool. You may have this test every year starting at age 21. Flexible sigmoidoscopy or colonoscopy. You may have a sigmoidoscopy every 5 years or a colonoscopy every 10 years starting at age 1. Hepatitis C blood test. Hepatitis B blood test. Sexually transmitted disease (STD) testing. Diabetes screening. This is done by checking your blood sugar (glucose) after you have not eaten for a while (fasting). You may have this done every 1-3 years. Bone density scan. This is done to screen for osteoporosis. You may have this done starting at age 26. Mammogram. This may be done every 1-2 years. Talk to your health care provider about how often you should have regular mammograms. Talk with your health care provider about your test results, treatment  options, and if necessary, the need for more tests. Vaccines  Your health care provider may recommend certain vaccines, such as: Influenza vaccine. This is recommended every year. Tetanus, diphtheria, and acellular pertussis (Tdap, Td) vaccine. You may need a Td booster every 10 years. Zoster vaccine. You may need this after age 40. Pneumococcal 13-valent conjugate (PCV13)  vaccine. One dose is recommended after age 4. Pneumococcal polysaccharide (PPSV23) vaccine. One dose is recommended after age 49. Talk to your health care provider about which screenings and vaccines you need and how often you need them. This information is not intended to replace advice given to you by your health care provider. Make sure you discuss any questions you have with your health care provider. Document Released: 02/15/2015 Document Revised: 10/09/2015 Document Reviewed: 11/20/2014 Elsevier Interactive Patient Education  2017 [place] Prevention in the Home Falls can cause injuries. They can happen to people of all ages. There are many things you can do to make your home safe and to help prevent falls. What can I do on the outside of my home? Regularly fix the edges of walkways and driveways and fix any cracks. Remove anything that might make you trip as you walk through a door, such as a raised step or threshold. Trim any bushes or trees on the path to your home. Use bright outdoor lighting. Clear any walking paths of anything that might make someone trip, such as rocks or tools. Regularly check to see if handrails are loose or broken. Make sure that both sides of any steps have handrails. Any raised decks and porches should have guardrails on the edges. Have any leaves, snow, or ice cleared regularly. Use sand or salt on walking paths during winter. Clean up any spills in your garage right away. This includes oil or grease spills. What can I do in the bathroom? Use night lights. Install grab bars by the toilet and in the tub and shower. Do not use towel bars as grab bars. Use non-skid mats or decals in the tub or shower. If you need to sit down in the shower, use a plastic, non-slip stool. Keep the floor dry. Clean up any water that spills on the floor as soon as it happens. Remove soap buildup in the tub or shower regularly. Attach bath mats securely with  double-sided non-slip rug tape. Do not have throw rugs and other things on the floor that can make you trip. What can I do in the bedroom? Use night lights. Make sure that you have a light by your bed that is easy to reach. Do not use any sheets or blankets that are too big for your bed. They should not hang down onto the floor. Have a firm chair that has side arms. You can use this for support while you get dressed. Do not have throw rugs and other things on the floor that can make you trip. What can I do in the kitchen? Clean up any spills right away. Avoid walking on wet floors. Keep items that you use a lot in easy-to-reach places. If you need to reach something above you, use a strong step stool that has a grab bar. Keep electrical cords out of the way. Do not use floor polish or wax that makes floors slippery. If you must use wax, use non-skid floor wax. Do not have throw rugs and other things on the floor that can make you trip. What can I do with my stairs? Do not  leave any items on the stairs. Make sure that there are handrails on both sides of the stairs and use them. Fix handrails that are broken or loose. Make sure that handrails are as long as the stairways. Check any carpeting to make sure that it is firmly attached to the stairs. Fix any carpet that is loose or worn. Avoid having throw rugs at the top or bottom of the stairs. If you do have throw rugs, attach them to the floor with carpet tape. Make sure that you have a light switch at the top of the stairs and the bottom of the stairs. If you do not have them, ask someone to add them for you. What else can I do to help prevent falls? Wear shoes that: Do not have high heels. Have rubber bottoms. Are comfortable and fit you well. Are closed at the toe. Do not wear sandals. If you use a stepladder: Make sure that it is fully opened. Do not climb a closed stepladder. Make sure that both sides of the stepladder are locked  into place. Ask someone to hold it for you, if possible. Clearly mark and make sure that you can see: Any grab bars or handrails. First and last steps. Where the edge of each step is. Use tools that help you move around (mobility aids) if they are needed. These include: Canes. Walkers. Scooters. Crutches. Turn on the lights when you go into a dark area. Replace any light bulbs as soon as they burn out. Set up your furniture so you have a clear path. Avoid moving your furniture around. If any of your floors are uneven, fix them. If there are any pets around you, be aware of where they are. Review your medicines with your doctor. Some medicines can make you feel dizzy. This can increase your chance of falling. Ask your doctor what other things that you can do to help prevent falls. This information is not intended to replace advice given to you by your health care provider. Make sure you discuss any questions you have with your health care provider. Document Released: 11/15/2008 Document Revised: 06/27/2015 Document Reviewed: 02/23/2014 Elsevier Interactive Patient Education  2017 [employer].

## 2021-04-07 NOTE — Progress Notes (Signed)
Subjective:   Michelle [name] is a 81 y.o. female who presents for Medicare Annual (Subsequent) preventive examination.  Review of Systems    Virtual Visit via Telephone Note  I connected with  Michelle [name] on 04/07/21 at  1:15 PM EST by telephone and verified that I am speaking with the correct person using two identifiers.  Location: Patient: Home Provider: Office Persons participating in the virtual visit: patient/Nurse Health Advisor   I discussed the limitations, risks, security and privacy concerns of performing an evaluation and management service by telephone and the availability of in person appointments. The patient expressed understanding and agreed to proceed.  Interactive audio and video telecommunications were attempted between this nurse and patient, however failed, due to patient having technical difficulties OR patient did not have access to video capability.  We continued and completed visit with audio only.  Some vital signs may be absent or patient reported.   Criselda Peaches, LPN  Cardiac Risk Factors include: advanced age (>35mn, >>22women);hypertension     Objective:    Today's Vitals   04/07/21 1317 04/07/21 1318  Weight: 165 lb (74.8 kg)   Height: '5\' 2"'$  (1.575 m)   PainSc:  3    Body mass index is 30.18 kg/m.  Advanced Directives 04/07/2021 02/10/2021 01/09/2020 02/08/2012 02/08/2012 01/29/2012  Does Patient Have a Medical Advance Directive? Yes Yes Yes Patient has advance directive, copy in chart - Patient has advance directive, copy in chart  Type of [place] will Living will [place] will  Does patient want to make changes to medical advance directive? No - Patient declined No - Patient declined - - - -  Copy of [place] Chart? No - copy requested No - copy requested - - - -  Pre-existing  out of facility DNR order (yellow form or pink MOST form) - - - No No -    Current Medications (verified) Outpatient Encounter Medications as of 04/07/2021  Medication Sig   acetaminophen (TYLENOL) 500 MG tablet Take 1,000 mg by mouth every 6 (six) hours as needed.   chlorthalidone (HYGROTON) 25 MG tablet TAKE 1/2 - 1 TABLET BY MOUTH EVERY DAY AS DIRECTED.   Cholecalciferol (VITAMIN D) 2000 UNITS CAPS Take 2,000 Units by mouth daily.   cyclobenzaprine (FLEXERIL) 10 MG tablet Take 1 tablet (10 mg total) by mouth 3 (three) times daily as needed for muscle spasms.   diclofenac Sodium (VOLTAREN) 1 % GEL Apply 2 g topically daily as needed (pain).   docusate sodium (COLACE) 100 MG capsule Take 1 capsule (100 mg total) by mouth 2 (two) times daily.   folic acid (FOLVITE) 1 MG tablet Take 1 mg by mouth daily.   losartan (COZAAR) 100 MG tablet TAKE 1 TABLET BY MOUTH EVERY DAY   metoprolol succinate (TOPROL-XL) 50 MG 24 hr tablet TAKE 1 TABLET BY MOUTH EVERY DAY   Multiple Vitamin (MULTIVITAMIN) tablet Take 1 tablet by mouth daily.   Multiple Vitamins-Minerals (PRESERVISION AREDS) TABS Take 1 tablet by mouth 2 (two) times daily.   oxyCODONE-acetaminophen (PERCOCET/ROXICET) 5-325 MG tablet Take 1-2 tablets by mouth every 4 (four) hours as needed for severe pain.   vitamin C (ASCORBIC ACID) 250 MG tablet Take 250 mg by mouth daily.   No facility-administered encounter medications on file as of 04/07/2021.    Allergies (verified) Patient has no known  allergies.   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   Hypertension    Osteopenia    Dr.John McComb   Past Surgical History:  Procedure Laterality Date   BACK SURGERY  09/2010;02/2012   DISC REMOVED AND SPACER/FUSION PLACED    CATARACT EXTRACTION, BILATERAL     COLONOSCOPY     negative   TOTAL ABDOMINAL HYSTERECTOMY W/ BILATERAL SALPINGOOPHORECTOMY     For Fibroids   TUBAL LIGATION     Family  History  Problem Relation Age of Onset   Kidney cancer Father        prostate    Cancer Father    Hypertension Mother        died at age 74    Autoimmune disease Mother        Hemolytic Anemia   Breast cancer Maternal Aunt    Hypertension Maternal Aunt    Stroke Maternal Grandfather 61   Osteoporosis Other        Mother, 2 M aunts , MGM   Atrial fibrillation Daughter 56   Diabetes Neg Hx    Heart disease Neg Hx    Social History   Socioeconomic History   Marital status: Widowed    Spouse name: Not on file   Number of children: Not on file   Years of education: Not on file   Highest education level: Not on file  Occupational History   Occupation: Retired  Tobacco Use   Smoking status: Never   Smokeless tobacco: Never  Vaping Use   Vaping Use: Never used  Substance and Sexual Activity   Alcohol use: No   Drug use: No   Sexual activity: Not on file  Other Topics Concern   Not on file  Social History Narrative   9 hours of sleep per night   Single living in the home (widow)   No pets   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   Childbirth x3   Social Determinants of Health   Financial Resource Strain: Low Risk    Difficulty of Paying Living Expenses: Not hard at all  Food Insecurity: No Food Insecurity   Worried About Charity fundraiser in the Last Year: Never true   Ran Out of Food in the Last Year: Never true  Transportation Needs: No Transportation Needs   Lack of Transportation (Medical): No   Lack of Transportation (Non-Medical): No  Physical Activity: Sufficiently Active   Days of Exercise per Week: 5 days   Minutes of Exercise per Session: 30 min  Stress: No Stress Concern Present   Feeling of Stress : Not at all  Social Connections: Moderately Integrated   Frequency of Communication with Friends and Family: More than three times a week   Frequency of Social  Gatherings with Friends and Family: More than three times a week   Attends Religious Services: More than 4 times per year   Active Member of [employer] or Organizations: Yes   Attends Archivist Meetings: More than 4 times per year   Marital Status: Widowed     Clinical Intake: How often do you need to have someone help you when you read instructions, pamphlets, or other written materials from your doctor or pharmacy?: 1 - Never  Diabetic?  No  Activities of Daily Living In your present state of health, do you have any difficulty performing the following activities: 04/07/2021 02/10/2021  Hearing? N N  Vision? N N  Difficulty concentrating or making decisions? N N  Walking or climbing stairs? N Y  Dressing or bathing? N N  Doing errands, shopping? N N  Preparing Food and eating ? N -  Using the Toilet? N -  In the past six months, have you accidently leaked urine? N -  Do you have problems with loss of bowel control? N -  Managing your Medications? N -  Managing your Finances? N -  Housekeeping or managing your Housekeeping? N -  Some recent data might be hidden    Patient Care Team: Panosh, Standley Brooking, MD as PCP - General (Internal Medicine) Arvella Nigh, MD as Consulting Physician (Obstetrics and Gynecology) Newman Pies, MD as Consulting Physician (Neurosurgery) Valinda Party, MD (Rheumatology) Sharyne Peach, MD as Consulting Physician (Ophthalmology)  Indicate any recent Medical Services you may have received from other than [facility] providers in the past year (date may be approximate).     Assessment:   This is a routine wellness examination for [place].  Hearing/Vision screen Hearing Screening - Comments:: No difficulty hearing Vision Screening - Comments:: Wears glasses. Followed by [place] issues and exercise activities discussed: Exercise limited by: None identified   Goals Addressed               This Visit's Progress      Patient Stated (pt-stated)        Lose weight.        Depression Screen PHQ 2/9 Scores 04/07/2021 01/09/2020 11/08/2018 04/28/2017 11/20/2015 11/13/2014 04/25/2013  PHQ - 2 Score 0 0 0 0 0 0 0    Fall Risk Fall Risk  04/07/2021 01/09/2020 11/08/2018 02/17/2018 04/28/2017  Falls in the past year? - 0 0 0 No  Number falls in past yr: 0 0 0 0 -  Injury with Fall? 0 0 0 0 -  Risk for fall due to : No Fall Risks Impaired vision - - -  Follow up - Falls prevention discussed - - -    FALL RISK PREVENTION PERTAINING TO THE HOME:  Any stairs in or around the home? Yes  If so, are there any without handrails? No  Home free of loose throw rugs in walkways, pet beds, electrical cords, etc? Yes  Adequate lighting in your home to reduce risk of falls? Yes   ASSISTIVE DEVICES UTILIZED TO PREVENT FALLS:  Life alert? No  Use of a cane, walker or w/c? No  Grab bars in the bathroom? No  Shower chair or bench in shower? No  Elevated toilet seat or a handicapped toilet? No   TIMED UP AND GO:  Was the test performed? No . Audio Visit  Cognitive Function:     6CIT Screen 04/07/2021 01/09/2020  What Year? 0 points 0 points  What month? 0 points 0 points  What time? 0 points -  Count back from 20 0 points 0 points  Months in reverse 0 points 0 points  Repeat phrase 0 points 0 points  Total Score 0 -    Immunizations Immunization History  Administered Date(s) Administered   Fluad Quad(high Dose 65+) 11/08/2018, 11/29/2019, 11/08/2020   Influenza Split 12/03/2012   Influenza Whole 12/24/2004, 10/19/2007   Influenza, High Dose Seasonal PF 12/19/2013, 11/07/2014, 11/20/2015, 12/01/2016   Influenza,inj,Quad PF,6+ Mos 12/03/2017   PFIZER(Purple Top)SARS-COV-2  Vaccination 02/23/2019, 03/16/2019, 11/18/2019   Pfizer Covid-19 Vaccine Bivalent Booster 73yr & up 10/25/2020   Pneumococcal Conjugate-13 11/13/2014   Pneumococcal Polysaccharide-23 11/25/2009   Td 02/04/2006    TDAP status: Due, Education  has been provided regarding the importance of this vaccine. Advised may receive this vaccine at local pharmacy or Health Dept. Aware to provide a copy of the vaccination record if obtained from local pharmacy or Health Dept. Verbalized acceptance and understanding.  Flu Vaccine status: Up to date  Pneumococcal vaccine status: Up to date  Covid-19 vaccine status: Completed vaccines  Qualifies for Shingles Vaccine? Yes   Zostavax completed No   Shingrix Completed?: No.    Education has been provided regarding the importance of this vaccine. Patient has been advised to call insurance company to determine out of pocket expense if they have not yet received this vaccine. Advised may also receive vaccine at local pharmacy or Health Dept. Verbalized acceptance and understanding.  Screening Tests Health Maintenance  Topic Date Due   Zoster Vaccines- Shingrix (1 of 2) 07/08/2021 (Originally 04/30/1959)   TETANUS/TDAP  04/08/2022 (Originally 02/05/2016)   Hepatitis C Screening  04/08/2022 (Originally 05/01/1958)   Pneumonia Vaccine 80 Years old  Completed   INFLUENZA VACCINE  Completed   DEXA SCAN  Completed   COVID-19 Vaccine  Completed   HPV VACCINES  Aged Out    Health Maintenance  There are no preventive care reminders to display for this patient.    Lung Cancer Screening: (Low Dose CT Chest recommended if Age 80-80years, 30 pack-year currently smoking OR have quit w/in 15years.) does not qualify.     Additional Screening:  Hepatitis C Screening:  qualify; Completed   Vision Screening: Recommended annual ophthalmology exams for early detection of glaucoma and other disorders of the eye. Is the patient up to date with their annual eye exam?  Yes  Who is the provider or what is the name of the office in which the patient attends annual eye exams? [facility] If pt is not established with a provider, would they like to be referred to a provider to establish care? No .   Dental  Screening: Recommended annual dental exams for proper oral hygiene  Community Resource Referral / Chronic Care Management:  CRR required this visit?  No   CCM required this visit?  No      Plan:     I have personally reviewed and noted the following in the patients chart:   Medical and social history Use of alcohol, tobacco or illicit drugs  Current medications and supplements including opioid prescriptions.  Functional ability and status Nutritional status Physical activity Advanced directives List of other physicians Hospitalizations, surgeries, and ER visits in previous 12 months Vitals Screenings to include cognitive, depression, and falls Referrals and appointments  In addition, I have reviewed and discussed with patient certain preventive protocols, quality metrics, and best practice recommendations. A written personalized care plan for preventive services as well as general preventive health recommendations were provided to patient.     BCriselda Peaches LPN   [id number]  Nurse Notes: None

## 2021-04-23 DIAGNOSIS — M4306 Spondylolysis, lumbar region: Secondary | ICD-10-CM | POA: Diagnosis not present

## 2021-04-24 DIAGNOSIS — Z79899 Other long term (current) drug therapy: Secondary | ICD-10-CM | POA: Diagnosis not present

## 2021-04-24 DIAGNOSIS — M1990 Unspecified osteoarthritis, unspecified site: Secondary | ICD-10-CM | POA: Diagnosis not present

## 2021-04-24 DIAGNOSIS — L4050 Arthropathic psoriasis, unspecified: Secondary | ICD-10-CM | POA: Diagnosis not present

## 2021-04-24 DIAGNOSIS — M5136 Other intervertebral disc degeneration, lumbar region: Secondary | ICD-10-CM | POA: Diagnosis not present

## 2021-04-24 DIAGNOSIS — M549 Dorsalgia, unspecified: Secondary | ICD-10-CM | POA: Diagnosis not present

## 2021-04-25 DIAGNOSIS — M4306 Spondylolysis, lumbar region: Secondary | ICD-10-CM | POA: Diagnosis not present

## 2021-04-28 DIAGNOSIS — M4306 Spondylolysis, lumbar region: Secondary | ICD-10-CM | POA: Diagnosis not present

## 2021-04-29 DIAGNOSIS — H353211 Exudative age-related macular degeneration, right eye, with active choroidal neovascularization: Secondary | ICD-10-CM | POA: Diagnosis not present

## 2021-04-29 DIAGNOSIS — H43813 Vitreous degeneration, bilateral: Secondary | ICD-10-CM | POA: Diagnosis not present

## 2021-04-29 DIAGNOSIS — H353122 Nonexudative age-related macular degeneration, left eye, intermediate dry stage: Secondary | ICD-10-CM | POA: Diagnosis not present

## 2021-04-30 DIAGNOSIS — M4306 Spondylolysis, lumbar region: Secondary | ICD-10-CM | POA: Diagnosis not present

## 2021-05-06 ENCOUNTER — Other Ambulatory Visit: Payer: Self-pay | Admitting: Internal Medicine

## 2021-05-06 DIAGNOSIS — M4306 Spondylolysis, lumbar region: Secondary | ICD-10-CM | POA: Diagnosis not present

## 2021-05-08 DIAGNOSIS — M4306 Spondylolysis, lumbar region: Secondary | ICD-10-CM | POA: Diagnosis not present

## 2021-05-09 ENCOUNTER — Other Ambulatory Visit: Payer: Self-pay | Admitting: Internal Medicine

## 2021-05-10 ENCOUNTER — Other Ambulatory Visit: Payer: Self-pay | Admitting: Internal Medicine

## 2021-05-14 DIAGNOSIS — M4306 Spondylolysis, lumbar region: Secondary | ICD-10-CM | POA: Diagnosis not present

## 2021-05-16 DIAGNOSIS — M4306 Spondylolysis, lumbar region: Secondary | ICD-10-CM | POA: Diagnosis not present

## 2021-05-19 DIAGNOSIS — M4306 Spondylolysis, lumbar region: Secondary | ICD-10-CM | POA: Diagnosis not present

## 2021-05-23 DIAGNOSIS — M4306 Spondylolysis, lumbar region: Secondary | ICD-10-CM | POA: Diagnosis not present

## 2021-05-27 DIAGNOSIS — M4306 Spondylolysis, lumbar region: Secondary | ICD-10-CM | POA: Diagnosis not present

## 2021-05-28 DIAGNOSIS — Z1231 Encounter for screening mammogram for malignant neoplasm of breast: Secondary | ICD-10-CM | POA: Diagnosis not present

## 2021-05-29 DIAGNOSIS — M4306 Spondylolysis, lumbar region: Secondary | ICD-10-CM | POA: Diagnosis not present

## 2021-06-09 DIAGNOSIS — M4306 Spondylolysis, lumbar region: Secondary | ICD-10-CM | POA: Diagnosis not present

## 2021-06-10 DIAGNOSIS — H35373 Puckering of macula, bilateral: Secondary | ICD-10-CM | POA: Diagnosis not present

## 2021-06-10 DIAGNOSIS — H353211 Exudative age-related macular degeneration, right eye, with active choroidal neovascularization: Secondary | ICD-10-CM | POA: Diagnosis not present

## 2021-06-10 DIAGNOSIS — H43813 Vitreous degeneration, bilateral: Secondary | ICD-10-CM | POA: Diagnosis not present

## 2021-06-10 DIAGNOSIS — H353122 Nonexudative age-related macular degeneration, left eye, intermediate dry stage: Secondary | ICD-10-CM | POA: Diagnosis not present

## 2021-06-12 DIAGNOSIS — M4306 Spondylolysis, lumbar region: Secondary | ICD-10-CM | POA: Diagnosis not present

## 2021-06-26 DIAGNOSIS — M4306 Spondylolysis, lumbar region: Secondary | ICD-10-CM | POA: Diagnosis not present

## 2021-07-08 DIAGNOSIS — M4306 Spondylolysis, lumbar region: Secondary | ICD-10-CM | POA: Diagnosis not present

## 2021-07-08 DIAGNOSIS — M48062 Spinal stenosis, lumbar region with neurogenic claudication: Secondary | ICD-10-CM | POA: Diagnosis not present

## 2021-07-15 DIAGNOSIS — Z79899 Other long term (current) drug therapy: Secondary | ICD-10-CM | POA: Diagnosis not present

## 2021-07-17 DIAGNOSIS — M4306 Spondylolysis, lumbar region: Secondary | ICD-10-CM | POA: Diagnosis not present

## 2021-07-22 DIAGNOSIS — H43812 Vitreous degeneration, left eye: Secondary | ICD-10-CM | POA: Diagnosis not present

## 2021-07-22 DIAGNOSIS — H353211 Exudative age-related macular degeneration, right eye, with active choroidal neovascularization: Secondary | ICD-10-CM | POA: Diagnosis not present

## 2021-07-22 DIAGNOSIS — H353122 Nonexudative age-related macular degeneration, left eye, intermediate dry stage: Secondary | ICD-10-CM | POA: Diagnosis not present

## 2021-07-24 DIAGNOSIS — M4306 Spondylolysis, lumbar region: Secondary | ICD-10-CM | POA: Diagnosis not present

## 2021-08-01 IMAGING — US US THYROID
1 series · 13 of 25 positions shown · non-contrast
Comparison: 07/13/2017

CLINICAL DATA: 79-year-old female with a history of thyroid nodule.

Previous biopsy of left inferior thyroid nodule 06/18/2016. Size of
the nodule at the time of biopsy 2.7 cm
EXAM:
THYROID ULTRASOUND
TECHNIQUE: Ultrasound examination of the thyroid gland and adjacent soft
tissues was performed.

[Series 1: us thyroid · 0.06mm/px · 13 of 50 slices shown]
[im 1/50]
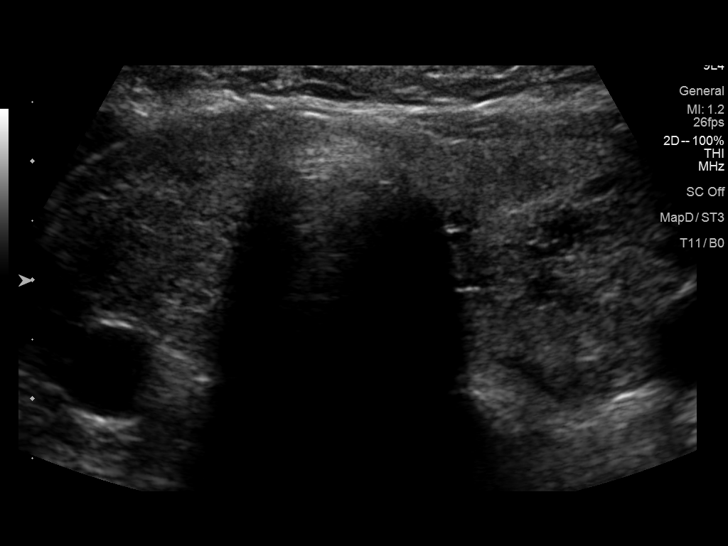
[im 5/50]
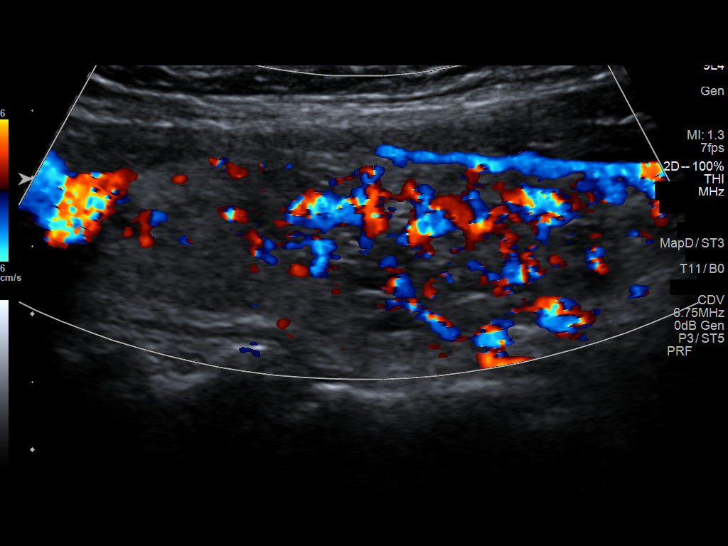
[im 9/50]
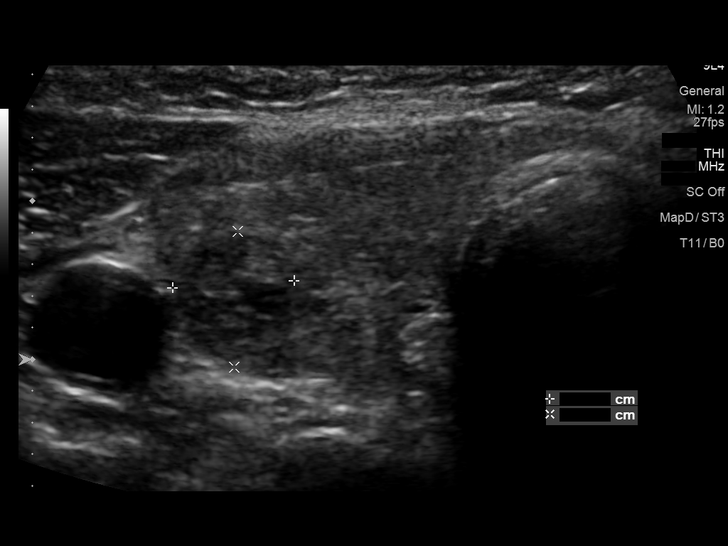
[im 13/50]
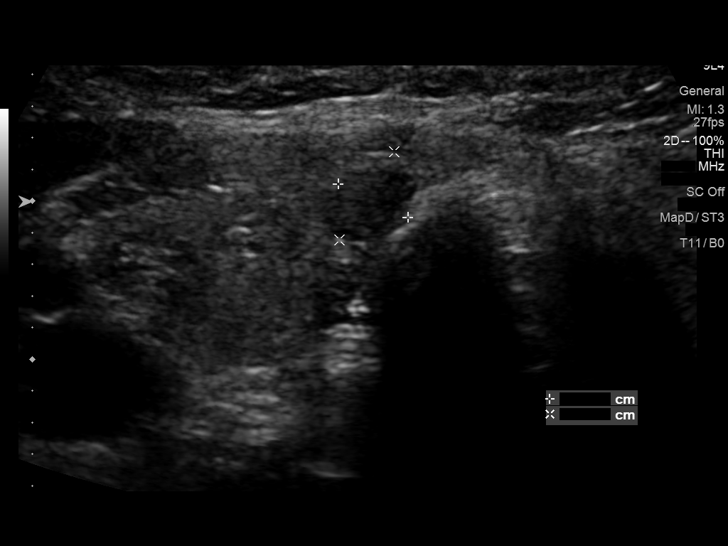
[im 17/50]
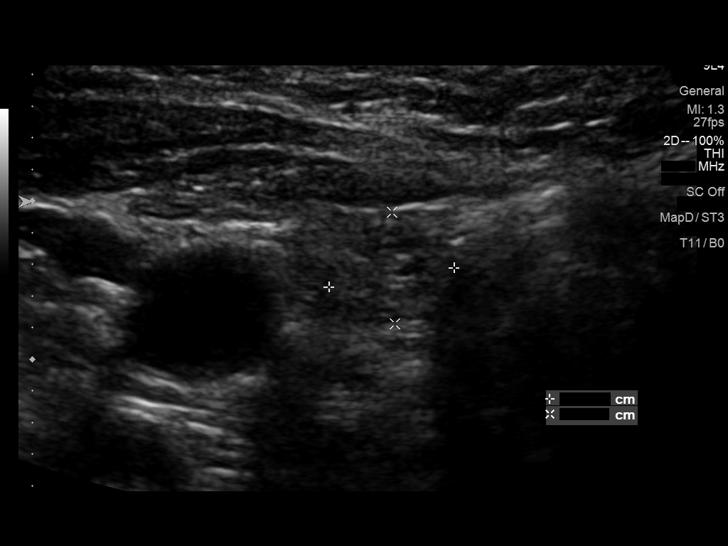
[im 21/50]
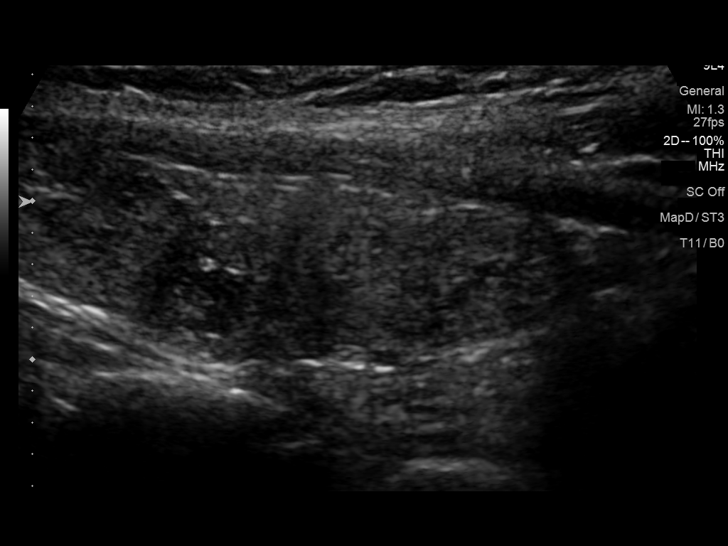
[im 25/50]
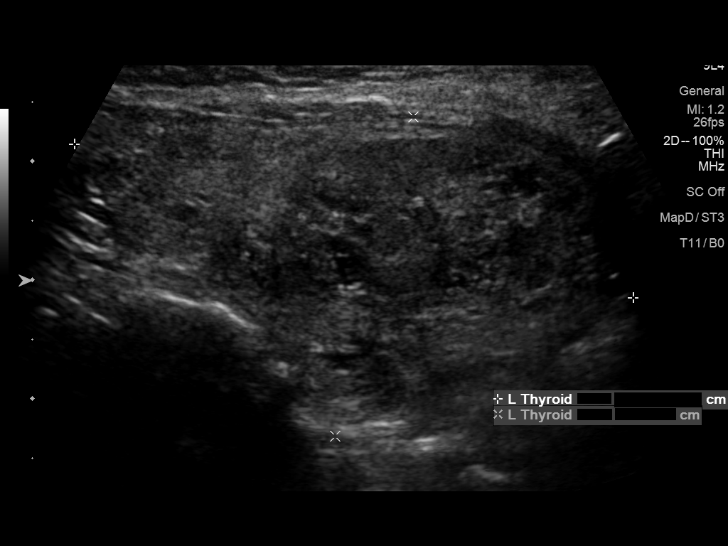
[im 29/50]
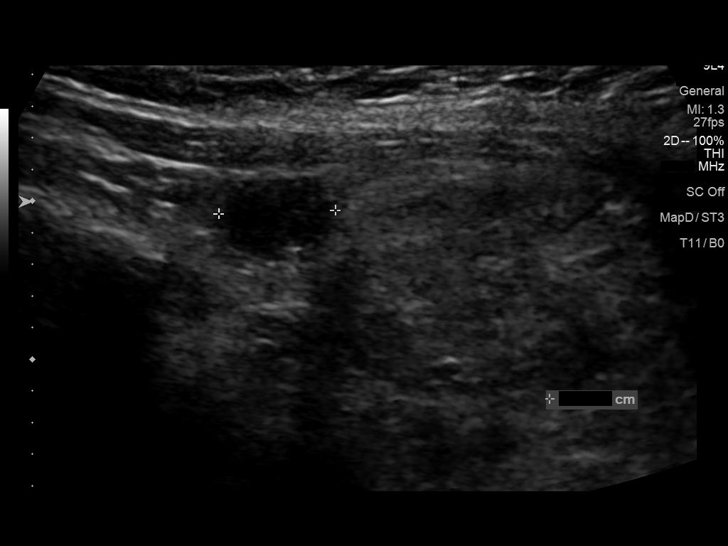
[im 33/50]
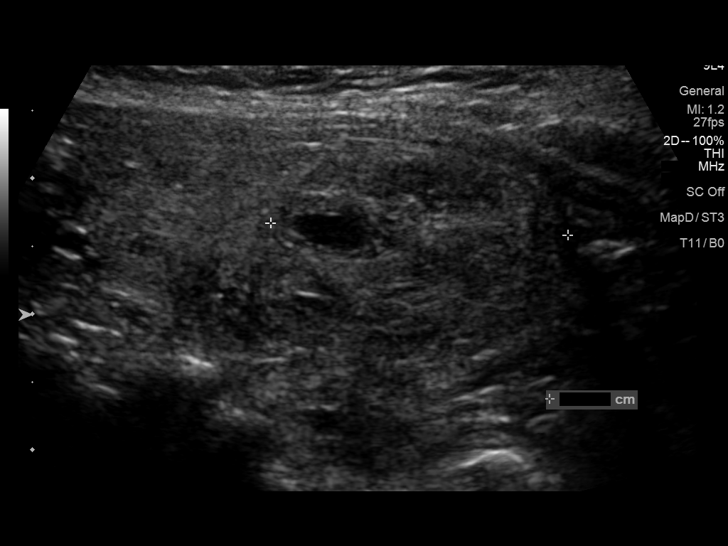
[im 37/50]
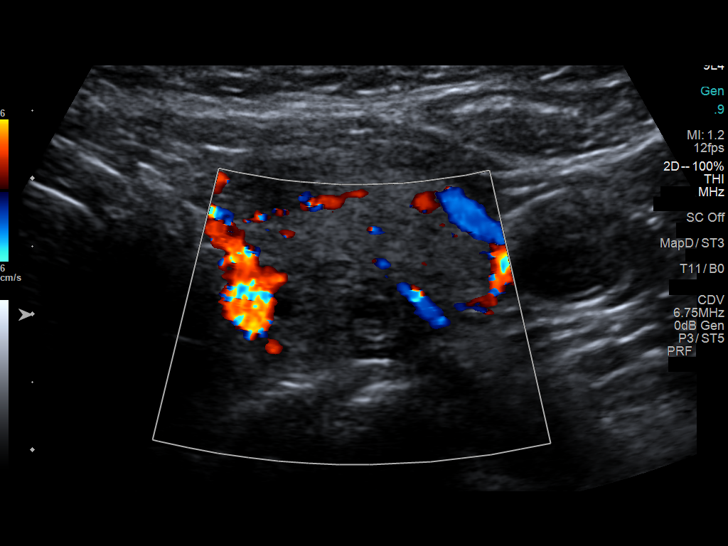
[im 41/50]
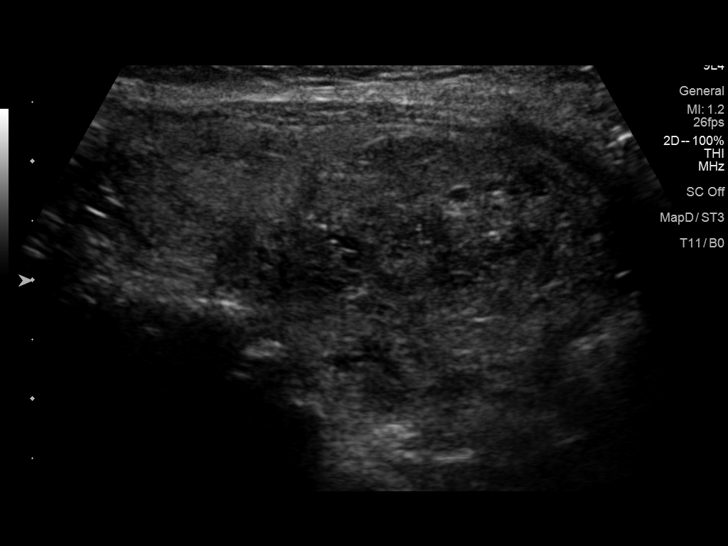
[im 45/50]
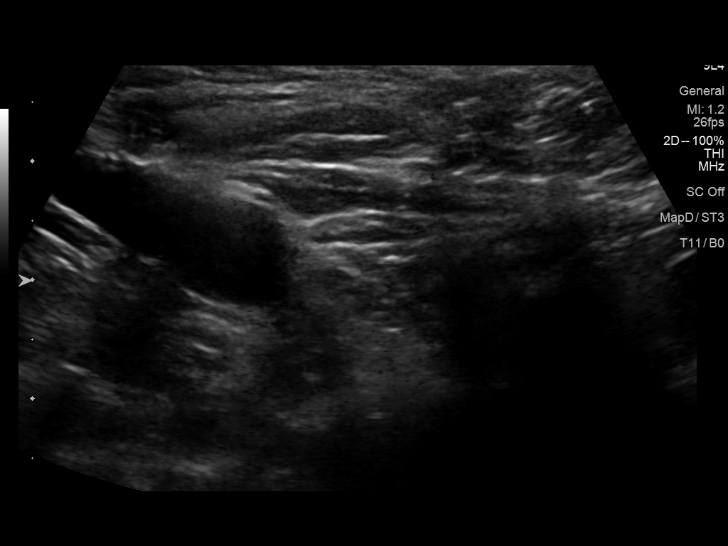
[im 50/50]
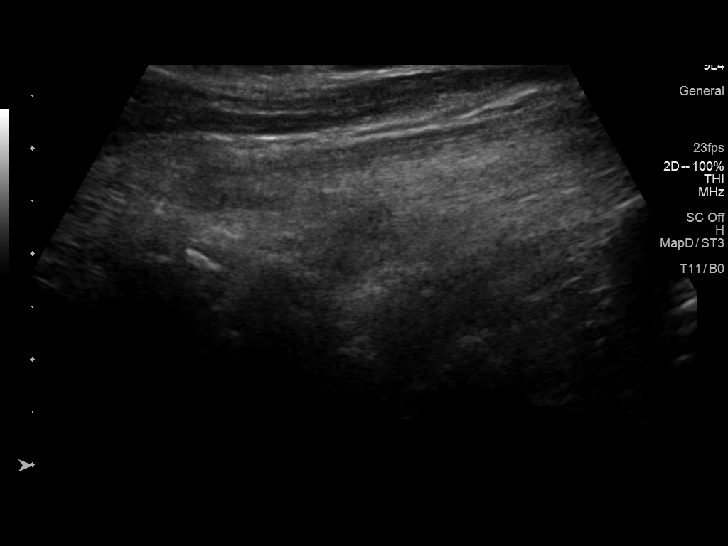

[13 of 25 positions shown; findings below may reference images not displayed]

FINDINGS: Parenchymal Echotexture: Moderately heterogenous

Isthmus: 0.4 cm

Right lobe: 4.7 cm x 1.6 cm x 2.0 cm

Left lobe: 4.9 cm x 2.8 cm x 2.2 cm

_________________________________________________________

Estimated total number of nodules >/= 1 cm: 4

Number of spongiform nodules >/=  2 cm not described below (TR1): 0

Number of mixed cystic and solid nodules >/= 1.5 cm not described
below (TR2): 0

_________________________________________________________

There are 7 nodules measured on the current ultrasound survey. The
only new nodule is labeled 7 at the inferior left.

All of the other nodules are relatively unchanged, with spongiform
characteristics and with overall characteristics which do not meet
criteria for further surveillance or biopsy, as previously
described.

The nodule which was biopsied previously, left inferior, labeled 6,
has decreased in size to 2.2 cm from the prior. Assuming benign
result, no further specific follow-up would be indicated.

Nodule # 7:

Location: Left; Inferior

Maximum size: 1.0 cm; Other 2 dimensions: 0.8 cm x 0.8 cm

Composition: spongiform (0)

ACR TI-RADS recommendations:

Spongiform nodule does not meet criteria for surveillance or biopsy

_________________________________________________________

No adenopathy

.
IMPRESSION: Multinodular thyroid again demonstrated.

No thyroid nodule meets criteria for biopsy or surveillance, as
designated by the newly established ACR TI-RADS criteria.

Recommendations follow those established by the new ACR TI-RADS
criteria ([HOSPITAL] 2672;[DATE]).

## 2021-08-04 ENCOUNTER — Other Ambulatory Visit: Payer: Self-pay | Admitting: Internal Medicine

## 2021-08-28 DIAGNOSIS — L4050 Arthropathic psoriasis, unspecified: Secondary | ICD-10-CM | POA: Diagnosis not present

## 2021-08-28 DIAGNOSIS — M1990 Unspecified osteoarthritis, unspecified site: Secondary | ICD-10-CM | POA: Diagnosis not present

## 2021-08-28 DIAGNOSIS — Z79899 Other long term (current) drug therapy: Secondary | ICD-10-CM | POA: Diagnosis not present

## 2021-08-28 DIAGNOSIS — M7061 Trochanteric bursitis, right hip: Secondary | ICD-10-CM | POA: Diagnosis not present

## 2021-08-28 DIAGNOSIS — M12811 Other specific arthropathies, not elsewhere classified, right shoulder: Secondary | ICD-10-CM | POA: Diagnosis not present

## 2021-08-28 DIAGNOSIS — M5136 Other intervertebral disc degeneration, lumbar region: Secondary | ICD-10-CM | POA: Diagnosis not present

## 2021-08-28 DIAGNOSIS — M549 Dorsalgia, unspecified: Secondary | ICD-10-CM | POA: Diagnosis not present

## 2021-08-28 LAB — HEPATIC FUNCTION PANEL
ALT: 26 U/L (ref 7–35)
AST: 22 (ref 13–35)
Alkaline Phosphatase: 112 (ref 25–125)
Bilirubin, Total: 0.4

## 2021-08-28 LAB — BASIC METABOLIC PANEL
BUN: 25 — AB (ref 4–21)
CO2: 25 — AB (ref 13–22)
Chloride: 100 (ref 99–108)
Creatinine: 0.7 (ref 0.5–1.1)
Glucose: 144
Potassium: 3.6 mEq/L (ref 3.5–5.1)
Sodium: 138 (ref 137–147)

## 2021-08-28 LAB — NOVEL CORONAVIRUS, NAA: SARS-CoV-2, NAA: NEGATIVE

## 2021-08-28 LAB — COMPREHENSIVE METABOLIC PANEL
Albumin: 3.4 — AB (ref 3.5–5.0)
Calcium: 9.4 (ref 8.7–10.7)
Globulin: 3.3

## 2021-08-28 LAB — CBC AND DIFFERENTIAL
HCT: 37 (ref 36–46)
Hemoglobin: 12.5 (ref 12.0–16.0)
Neutrophils Absolute: 5.9
Platelets: 312 10*3/uL (ref 150–400)
WBC: 10.8

## 2021-08-28 LAB — CBC: RBC: 4.13 (ref 3.87–5.11)

## 2021-08-29 ENCOUNTER — Encounter: Payer: Self-pay | Admitting: Internal Medicine

## 2021-09-09 DIAGNOSIS — H353122 Nonexudative age-related macular degeneration, left eye, intermediate dry stage: Secondary | ICD-10-CM | POA: Diagnosis not present

## 2021-09-09 DIAGNOSIS — H353211 Exudative age-related macular degeneration, right eye, with active choroidal neovascularization: Secondary | ICD-10-CM | POA: Diagnosis not present

## 2021-09-09 DIAGNOSIS — H43812 Vitreous degeneration, left eye: Secondary | ICD-10-CM | POA: Diagnosis not present

## 2021-10-21 DIAGNOSIS — H353211 Exudative age-related macular degeneration, right eye, with active choroidal neovascularization: Secondary | ICD-10-CM | POA: Diagnosis not present

## 2021-10-21 DIAGNOSIS — H353122 Nonexudative age-related macular degeneration, left eye, intermediate dry stage: Secondary | ICD-10-CM | POA: Diagnosis not present

## 2021-10-29 DIAGNOSIS — Z23 Encounter for immunization: Secondary | ICD-10-CM | POA: Diagnosis not present

## 2021-11-03 ENCOUNTER — Other Ambulatory Visit: Payer: Self-pay | Admitting: Internal Medicine

## 2021-11-06 DIAGNOSIS — M12811 Other specific arthropathies, not elsewhere classified, right shoulder: Secondary | ICD-10-CM | POA: Diagnosis not present

## 2021-11-06 DIAGNOSIS — L4050 Arthropathic psoriasis, unspecified: Secondary | ICD-10-CM | POA: Diagnosis not present

## 2021-11-06 DIAGNOSIS — Z23 Encounter for immunization: Secondary | ICD-10-CM | POA: Diagnosis not present

## 2021-11-06 DIAGNOSIS — M5136 Other intervertebral disc degeneration, lumbar region: Secondary | ICD-10-CM | POA: Diagnosis not present

## 2021-11-06 DIAGNOSIS — M1990 Unspecified osteoarthritis, unspecified site: Secondary | ICD-10-CM | POA: Diagnosis not present

## 2021-11-06 DIAGNOSIS — M549 Dorsalgia, unspecified: Secondary | ICD-10-CM | POA: Diagnosis not present

## 2021-11-06 DIAGNOSIS — Z79899 Other long term (current) drug therapy: Secondary | ICD-10-CM | POA: Diagnosis not present

## 2021-11-19 DIAGNOSIS — H353121 Nonexudative age-related macular degeneration, left eye, early dry stage: Secondary | ICD-10-CM | POA: Diagnosis not present

## 2021-11-19 DIAGNOSIS — H52203 Unspecified astigmatism, bilateral: Secondary | ICD-10-CM | POA: Diagnosis not present

## 2021-11-19 DIAGNOSIS — H353213 Exudative age-related macular degeneration, right eye, with inactive scar: Secondary | ICD-10-CM | POA: Diagnosis not present

## 2021-12-02 DIAGNOSIS — H353122 Nonexudative age-related macular degeneration, left eye, intermediate dry stage: Secondary | ICD-10-CM | POA: Diagnosis not present

## 2021-12-02 DIAGNOSIS — H43813 Vitreous degeneration, bilateral: Secondary | ICD-10-CM | POA: Diagnosis not present

## 2021-12-02 DIAGNOSIS — H353211 Exudative age-related macular degeneration, right eye, with active choroidal neovascularization: Secondary | ICD-10-CM | POA: Diagnosis not present

## 2021-12-02 DIAGNOSIS — H35373 Puckering of macula, bilateral: Secondary | ICD-10-CM | POA: Diagnosis not present

## 2021-12-17 ENCOUNTER — Encounter: Payer: Self-pay | Admitting: Internal Medicine

## 2021-12-17 ENCOUNTER — Ambulatory Visit (INDEPENDENT_AMBULATORY_CARE_PROVIDER_SITE_OTHER): Payer: Medicare Other | Admitting: Internal Medicine

## 2021-12-17 VITALS — BP 110/80 | HR 76 | Temp 97.5°F | Wt 165.6 lb

## 2021-12-17 DIAGNOSIS — J069 Acute upper respiratory infection, unspecified: Secondary | ICD-10-CM | POA: Diagnosis not present

## 2021-12-17 NOTE — Progress Notes (Signed)
Acute office Visit     CC/Reason for Visit: URI symptoms  HPI: Michelle [name] is a 81 y.o. female who is coming in today for the above mentioned reasons.  For the past 7 days she has been experiencing sore throat, rhinorrhea, postnasal drip, cough and sneezing.  No recent travel, however 2 days prior to initiation of her symptoms her housekeeper had a cold as well.  She never took a COVID test.  She has been using Coricidin BP without much relief.   Past Medical/Surgical 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   Hypertension    Osteopenia    Dr.John McComb    Past Surgical History:  Procedure Laterality Date   BACK SURGERY  09/2010;02/2012   DISC REMOVED AND SPACER/FUSION PLACED    CATARACT EXTRACTION, BILATERAL     COLONOSCOPY     negative   TOTAL ABDOMINAL HYSTERECTOMY W/ BILATERAL SALPINGOOPHORECTOMY     For Fibroids   TUBAL LIGATION      Social History:  reports that she has never smoked. She has never used smokeless tobacco. She reports that she does not drink alcohol and does not use drugs.  Allergies: No Known Allergies  Family History:  Family History  Problem Relation Age of Onset   Kidney cancer Father        prostate    Cancer Father    Hypertension Mother        died at age 48    Autoimmune disease Mother        Hemolytic Anemia   Breast cancer Maternal Aunt    Hypertension Maternal Aunt    Stroke Maternal Grandfather 79   Osteoporosis Other        Mother, 2 M aunts , MGM   Atrial fibrillation Daughter 44   Diabetes Neg Hx    Heart disease Neg Hx      Current Outpatient Medications:    chlorthalidone (HYGROTON) 25 MG tablet, TAKE 1/2 - 1 TABLET BY MOUTH EVERY DAY AS DIRECTED., Disp: 90 tablet, Rfl: 0   Cholecalciferol (VITAMIN D) 2000 UNITS CAPS, Take 2,000 Units by mouth daily., Disp: , Rfl:    folic acid (FOLVITE) 1 MG tablet, Take 1 mg by mouth daily., Disp: , Rfl:    losartan (COZAAR)  100 MG tablet, TAKE 1 TABLET (100 MG TOTAL) BY MOUTH DAILY. SCHEDULE APPT FOR FUTURE REFILLS, Disp: 90 tablet, Rfl: 0   metoprolol succinate (TOPROL-XL) 50 MG 24 hr tablet, TAKE 1 TABLET BY MOUTH EVERY DAY, Disp: 90 tablet, Rfl: 0   Multiple Vitamin (MULTIVITAMIN) tablet, Take 1 tablet by mouth daily., Disp: , Rfl:    Multiple Vitamins-Minerals (PRESERVISION AREDS) TABS, Take 1 tablet by mouth 2 (two) times daily., Disp: , Rfl:    vitamin C (ASCORBIC ACID) 250 MG tablet, Take 250 mg by mouth daily., Disp: , Rfl:   Review of Systems:  Constitutional: Denies fever, chills, diaphoresis, appetite change. HEENT: Denies photophobia, eye pain, redness, mouth sores, trouble swallowing, neck pain, neck stiffness and tinnitus.   Respiratory: Denies SOB, DOE,  chest tightness,  and wheezing.   Cardiovascular: Denies chest pain, palpitations and leg swelling.  Gastrointestinal: Denies nausea, vomiting, abdominal pain, diarrhea, constipation, blood in stool and abdominal distention.  Genitourinary: Denies dysuria, urgency, frequency, hematuria, flank pain and difficulty urinating.  Endocrine: Denies: hot or cold intolerance, sweats, changes in hair or nails, polyuria, polydipsia. Musculoskeletal: Denies myalgias, back pain, joint  swelling, arthralgias and gait problem.  Skin: Denies pallor, rash and wound.  Neurological: Denies dizziness, seizures, syncope, weakness, light-headedness, numbness and headaches.  Hematological: Denies adenopathy. Easy bruising, personal or family bleeding history  Psychiatric/Behavioral: Denies suicidal ideation, mood changes, confusion, nervousness, sleep disturbance and agitation    Physical Exam: Vitals:   12/17/21 1551  BP: 110/80  Pulse: 76  Temp: (!) 97.5 F (36.4 C)  TempSrc: Oral  SpO2: 98%  Weight: 165 lb 9.6 oz (75.1 kg)    Body mass index is 30.29 kg/m.   Constitutional: NAD, calm, comfortable Eyes: PERRL, lids and conjunctivae normal ENMT: Mucous  membranes are moist. Posterior pharynx is erythematous but clear of any exudate or lesions. Normal dentition. Tympanic membrane is pearly white, no erythema or bulging. Respiratory: clear to auscultation bilaterally, no wheezing, no crackles. Normal respiratory effort. No accessory muscle use.  Cardiovascular: Regular rate and rhythm, no murmurs / rubs / gallops. No extremity edema.   Psychiatric: Normal judgment and insight. Alert and oriented x 3. Normal mood.    Impression and Plan:  Viral upper respiratory tract infection  -Given exam findings, PNA, pharyngitis, ear infection are not likely, hence abx have not been prescribed. -Have advised rest, fluids, OTC antihistamines, cough suppressants and mucinex. -RTC if no improvement in 10-14 days.   Time spent:22 minutes reviewing chart, interviewing and examining patient and formulating plan of care.      Lelon Frohlich, MD [facility]

## 2021-12-18 ENCOUNTER — Emergency Department (HOSPITAL_COMMUNITY): Payer: Medicare Other

## 2021-12-18 ENCOUNTER — Encounter (HOSPITAL_COMMUNITY): Payer: Self-pay

## 2021-12-18 ENCOUNTER — Emergency Department (HOSPITAL_COMMUNITY)
Admission: EM | Admit: 2021-12-18 | Discharge: 2021-12-18 | Disposition: A | Discharge status: Home / Self Care | Payer: Medicare Other | Attending: Emergency Medicine | Admitting: Emergency Medicine

## 2021-12-18 ENCOUNTER — Other Ambulatory Visit: Payer: Self-pay

## 2021-12-18 DIAGNOSIS — J069 Acute upper respiratory infection, unspecified: Secondary | ICD-10-CM | POA: Diagnosis not present

## 2021-12-18 DIAGNOSIS — S0990XA Unspecified injury of head, initial encounter: Secondary | ICD-10-CM | POA: Diagnosis not present

## 2021-12-18 DIAGNOSIS — R55 Syncope and collapse: Secondary | ICD-10-CM | POA: Diagnosis not present

## 2021-12-18 DIAGNOSIS — E860 Dehydration: Secondary | ICD-10-CM | POA: Diagnosis not present

## 2021-12-18 DIAGNOSIS — R000 Tachycardia, unspecified: Secondary | ICD-10-CM | POA: Diagnosis not present

## 2021-12-18 DIAGNOSIS — R8290 Unspecified abnormal findings in urine: Secondary | ICD-10-CM | POA: Insufficient documentation

## 2021-12-18 DIAGNOSIS — R059 Cough, unspecified: Secondary | ICD-10-CM | POA: Diagnosis not present

## 2021-12-18 DIAGNOSIS — Z1152 Encounter for screening for COVID-19: Secondary | ICD-10-CM | POA: Insufficient documentation

## 2021-12-18 DIAGNOSIS — R0981 Nasal congestion: Secondary | ICD-10-CM | POA: Diagnosis present

## 2021-12-18 DIAGNOSIS — R42 Dizziness and giddiness: Secondary | ICD-10-CM | POA: Insufficient documentation

## 2021-12-18 DIAGNOSIS — I959 Hypotension, unspecified: Secondary | ICD-10-CM | POA: Diagnosis not present

## 2021-12-18 DIAGNOSIS — E876 Hypokalemia: Secondary | ICD-10-CM | POA: Insufficient documentation

## 2021-12-18 DIAGNOSIS — J324 Chronic pansinusitis: Secondary | ICD-10-CM | POA: Diagnosis not present

## 2021-12-18 DIAGNOSIS — W19XXXA Unspecified fall, initial encounter: Secondary | ICD-10-CM | POA: Insufficient documentation

## 2021-12-18 DIAGNOSIS — R9082 White matter disease, unspecified: Secondary | ICD-10-CM | POA: Diagnosis not present

## 2021-12-18 DIAGNOSIS — R001 Bradycardia, unspecified: Secondary | ICD-10-CM | POA: Diagnosis not present

## 2021-12-18 DIAGNOSIS — G319 Degenerative disease of nervous system, unspecified: Secondary | ICD-10-CM | POA: Diagnosis not present

## 2021-12-18 LAB — CBC WITH DIFFERENTIAL/PLATELET
Abs Immature Granulocytes: 0.03 10*3/uL (ref 0.00–0.07)
Basophils Absolute: 0 10*3/uL (ref 0.0–0.1)
Basophils Relative: 1 %
Eosinophils Absolute: 0 10*3/uL (ref 0.0–0.5)
Eosinophils Relative: 0 %
HCT: 41.4 % (ref 36.0–46.0)
Hemoglobin: 14.2 g/dL (ref 12.0–15.0)
Immature Granulocytes: 1 %
Lymphocytes Relative: 27 %
Lymphs Abs: 1.5 10*3/uL (ref 0.7–4.0)
MCH: 31.3 pg (ref 26.0–34.0)
MCHC: 34.3 g/dL (ref 30.0–36.0)
MCV: 91.4 fL (ref 80.0–100.0)
Monocytes Absolute: 0.5 10*3/uL (ref 0.1–1.0)
Monocytes Relative: 9 %
Neutro Abs: 3.4 10*3/uL (ref 1.7–7.7)
Neutrophils Relative %: 62 %
Platelets: 242 10*3/uL (ref 150–400)
RBC: 4.53 MIL/uL (ref 3.87–5.11)
RDW: 13.8 % (ref 11.5–15.5)
WBC: 5.5 10*3/uL (ref 4.0–10.5)
nRBC: 0 % (ref 0.0–0.2)

## 2021-12-18 LAB — URINALYSIS, ROUTINE W REFLEX MICROSCOPIC
Bacteria, UA: NONE SEEN
Bilirubin Urine: NEGATIVE
Glucose, UA: NEGATIVE mg/dL
Hgb urine dipstick: NEGATIVE
Ketones, ur: NEGATIVE mg/dL
Nitrite: NEGATIVE
Protein, ur: 30 mg/dL — AB
Specific Gravity, Urine: 1.012 (ref 1.005–1.030)
pH: 6 (ref 5.0–8.0)

## 2021-12-18 LAB — COMPREHENSIVE METABOLIC PANEL
ALT: 26 U/L (ref 0–44)
AST: 32 U/L (ref 15–41)
Albumin: 3.4 g/dL — ABNORMAL LOW (ref 3.5–5.0)
Alkaline Phosphatase: 87 U/L (ref 38–126)
Anion gap: 12 (ref 5–15)
BUN: 20 mg/dL (ref 8–23)
CO2: 25 mmol/L (ref 22–32)
Calcium: 9 mg/dL (ref 8.9–10.3)
Chloride: 95 mmol/L — ABNORMAL LOW (ref 98–111)
Creatinine, Ser: 0.92 mg/dL (ref 0.44–1.00)
GFR, Estimated: >60 mL/min (ref >60)
Glucose, Bld: 115 mg/dL — ABNORMAL HIGH (ref 70–99)
Potassium: 3.1 mmol/L — ABNORMAL LOW (ref 3.5–5.1)
Sodium: 132 mmol/L — ABNORMAL LOW (ref 135–145)
Total Bilirubin: 0.3 mg/dL (ref 0.3–1.2)
Total Protein: 6.6 g/dL (ref 6.5–8.1)

## 2021-12-18 LAB — TROPONIN I (HIGH SENSITIVITY)
Troponin I (High Sensitivity): 7 ng/L (ref <18)
Troponin I (High Sensitivity): 7 ng/L (ref <18)

## 2021-12-18 LAB — I-STAT CHEM 8, ED
BUN: 21 mg/dL (ref 8–23)
Calcium, Ion: 1.11 mmol/L — ABNORMAL LOW (ref 1.15–1.40)
Chloride: 96 mmol/L — ABNORMAL LOW (ref 98–111)
Creatinine, Ser: 0.9 mg/dL (ref 0.44–1.00)
Glucose, Bld: 114 mg/dL — ABNORMAL HIGH (ref 70–99)
HCT: 43 % (ref 36.0–46.0)
Hemoglobin: 14.6 g/dL (ref 12.0–15.0)
Potassium: 3.2 mmol/L — ABNORMAL LOW (ref 3.5–5.1)
Sodium: 135 mmol/L (ref 135–145)
TCO2: 25 mmol/L (ref 22–32)

## 2021-12-18 LAB — RESP PANEL BY RT-PCR (FLU A&B, COVID) ARPGX2
Influenza A by PCR: NEGATIVE
Influenza B by PCR: NEGATIVE
SARS Coronavirus 2 by RT PCR: NEGATIVE

## 2021-12-18 MED ORDER — SODIUM CHLORIDE 0.9 % IV BOLUS
500.0000 mL | Freq: Once | INTRAVENOUS | Status: AC
  Administered 2021-12-18: 500 mL via INTRAVENOUS

## 2021-12-18 MED ORDER — CEPHALEXIN 500 MG PO CAPS: 500.0000 mg | ORAL_CAPSULE | Freq: Four times a day (QID) | ORAL | 0 refills | Status: DC

## 2021-12-18 MED ORDER — POTASSIUM CHLORIDE CRYS ER 20 MEQ PO TBCR
40.0000 meq | EXTENDED_RELEASE_TABLET | Freq: Once | ORAL | Status: AC
  Administered 2021-12-18: 40 meq via ORAL
  Filled 2021-12-18: qty 2

## 2021-12-18 NOTE — ED Triage Notes (Signed)
[facility] for syncope 1.5 hours ago, hit side of face during episode, nasal congestion and body aches x1 week, EMS reports bigeminy and trigeminy on EKG, pt A&Ox4

## 2021-12-18 NOTE — ED Triage Notes (Signed)
Cough, pt reports feeling "hot before falling"

## 2021-12-18 NOTE — Discharge Instructions (Addendum)
Return for any problem.  Drink plenty of fluids.  Take Keflex as prescribed for suspected early urinary tract infection.

## 2021-12-18 NOTE — ED Provider Notes (Signed)
[facility] EMERGENCY DEPARTMENT Provider Note   CSN: [id number] Arrival date & time: 12/18/21  0849     History  Chief Complaint  Patient presents with   Fall   Loss of Consciousness    Michelle [name] is a 81 y.o. female.  81 year old female with prior medical history detailed below presents for evaluation.  Patient with 2 to 3 days of upper respiratory congestion.  Patient reports decreased p.o. intake.  Patient reports that this morning she felt hot and then lightheaded just prior to having a fainting episode.  She did not injure herself during the fall.  She complains of body aches for the last several days.  She denies fever.  Period of LOC is reportedly less than 5 seconds.  The history is provided by the patient and medical records.       Home Medications Prior to Admission medications   Medication Sig Start Date End Date Taking? Authorizing Provider  chlorthalidone (HYGROTON) 25 MG tablet TAKE 1/2 - 1 TABLET BY MOUTH EVERY DAY AS DIRECTED. 11/03/21   Panosh, Standley Brooking, MD  Cholecalciferol (VITAMIN D) 2000 UNITS CAPS Take 2,000 Units by mouth daily.    [provider]  folic acid (FOLVITE) 1 MG tablet Take 1 mg by mouth daily.    [provider]  losartan (COZAAR) 100 MG tablet TAKE 1 TABLET (100 MG TOTAL) BY MOUTH DAILY. SCHEDULE APPT FOR FUTURE REFILLS 11/03/21   Panosh, Standley Brooking, MD  metoprolol succinate (TOPROL-XL) 50 MG 24 hr tablet TAKE 1 TABLET BY MOUTH EVERY DAY 11/03/21   Panosh, Standley Brooking, MD  Multiple Vitamin (MULTIVITAMIN) tablet Take 1 tablet by mouth daily.    [provider]  Multiple Vitamins-Minerals (PRESERVISION AREDS) TABS Take 1 tablet by mouth 2 (two) times daily.    [provider]  vitamin C (ASCORBIC ACID) 250 MG tablet Take 250 mg by mouth daily.    [provider]      Allergies    Patient has no known allergies.    Review of Systems   Review of Systems  Cardiovascular:  Positive for  syncope.  All other systems reviewed and are negative.   Physical Exam Updated Vital Signs BP 124/69   Pulse 68   Temp 97.9 F (36.6 C) (Oral)   Resp 17   Ht '5\' 2"'$  (1.575 m)   Wt 74.8 kg   LMP 04/19/1997   SpO2 93%   BMI 30.18 kg/m  Physical Exam Vitals and nursing note reviewed.  Constitutional:      General: She is not in acute distress.    Appearance: Normal appearance. She is well-developed.  HENT:     Head: Normocephalic and atraumatic.     Comments: Mild nasal congestion noted Eyes:     Conjunctiva/sclera: Conjunctivae normal.     Pupils: Pupils are equal, round, and reactive to light.  Cardiovascular:     Rate and Rhythm: Normal rate and regular rhythm.     Heart sounds: Normal heart sounds.  Pulmonary:     Effort: Pulmonary effort is normal. No respiratory distress.     Breath sounds: Normal breath sounds.  Abdominal:     General: There is no distension.     Palpations: Abdomen is soft.     Tenderness: There is no abdominal tenderness.  Musculoskeletal:        General: No deformity. Normal range of motion.     Cervical back: Normal range of motion and neck  supple.  Skin:    General: Skin is warm and dry.  Neurological:     General: No focal deficit present.     Mental Status: She is alert and oriented to person, place, and time.     ED Results / Procedures / Treatments   Labs (all labs ordered are listed, but only abnormal results are displayed) Labs Reviewed  COMPREHENSIVE METABOLIC PANEL - Abnormal; Notable for the following components:      Result Value   Sodium 132 (*)    Potassium 3.1 (*)    Chloride 95 (*)    Glucose, Bld 115 (*)    Albumin 3.4 (*)    All other components within normal limits  URINALYSIS, ROUTINE W REFLEX MICROSCOPIC - Abnormal; Notable for the following components:   APPearance HAZY (*)    Protein, ur 30 (*)    Leukocytes,Ua LARGE (*)    All other components within normal limits  I-STAT CHEM 8, ED - Abnormal; Notable  for the following components:   Potassium 3.2 (*)    Chloride 96 (*)    Glucose, Bld 114 (*)    Calcium, Ion 1.11 (*)    All other components within normal limits  RESP PANEL BY RT-PCR (FLU A&B, COVID) ARPGX2  CBC WITH DIFFERENTIAL/PLATELET  TROPONIN I (HIGH SENSITIVITY)  TROPONIN I (HIGH SENSITIVITY)    EKG EKG Interpretation  Date/Time:  Thursday December 18 2021 09:00:23 EST Ventricular Rate:  67 PR Interval:  149 QRS Duration: 81 QT Interval:  396 QTC Calculation: 418 R Axis:   63 Text Interpretation: Sinus rhythm Probable anteroseptal infarct, old Confirmed by Dene Gentry [phone]) on 12/18/2021 9:14:17 AM  Radiology DG Chest Port 1 View  Result Date: 12/18/2021 CLINICAL DATA:  Syncope, cough. EXAM: PORTABLE CHEST 1 VIEW COMPARISON:  February 17, 2018. FINDINGS: The heart size and mediastinal contours are within normal limits. Both lungs are clear. The visualized skeletal structures are unremarkable. IMPRESSION: No active disease. Electronically Signed   By: Marijo Conception M.D.   On: 12/18/2021 09:59   CT Head Wo Contrast  Result Date: 12/18/2021 CLINICAL DATA:  Head trauma. EXAM: CT HEAD WITHOUT CONTRAST TECHNIQUE: Contiguous axial images were obtained from the base of the skull through the vertex without intravenous contrast. RADIATION DOSE REDUCTION: This exam was performed according to the departmental dose-optimization program which includes automated exposure control, adjustment of the mA and/or kV according to patient size and/or use of iterative reconstruction technique. COMPARISON:  None Available. FINDINGS: Brain: No acute intracranial hemorrhage. No focal mass lesion. No CT evidence of acute infarction. No midline shift or mass effect. No hydrocephalus. Basilar cisterns are patent. Patchy subcortical and deep white matter hypodensities. Minimal cortical atrophy for age. Vascular: No hyperdense vessel or unexpected calcification. Skull: Normal. Negative for fracture  or focal lesion. Sinuses/Orbits: Opacification of the LEFT maxillary sinus with expansion of the medial sinus wall. Scattered opacification of ethmoid air cells. Mucosal thickening in the sphenoid sinus. Other: None. IMPRESSION: 1. No acute intracranial findings. 2. Patchy white matter microvascular disease. 3. Chronic pansinusitis. Complete opacification of the LEFT maxillary sinus with expansion of the sinus medially. Electronically Signed   By: Suzy Bouchard M.D.   On: 12/18/2021 09:44    Procedures Procedures    Medications Ordered in ED Medications  sodium chloride 0.9 % bolus 500 mL (0 mLs Intravenous Stopped 12/18/21 1049)  potassium chloride SA (KLOR-CON M) CR tablet 40 mEq (40 mEq Oral Given 12/18/21 0955)  ED Course/ Medical Decision Making/ A&P                           Medical Decision Making Amount and/or Complexity of Data Reviewed Labs: ordered. Radiology: ordered.  Risk Prescription drug management.    Medical Screen Complete  This patient presented to the ED with complaint of syncope.  This complaint involves an extensive number of treatment options. The initial differential diagnosis includes, but is not limited to, metabolic abnormality, URI, UTI, etc.  This presentation is: Acute, Self-Limited, Previously Undiagnosed, Uncertain Prognosis, Complicated, Systemic Symptoms, and Threat to Life/Bodily Function  Patient presents with complaint of brief syncope.  Patient with recent symptoms suggestive of URI and mild dehydration.  Patient's syncope is suggestive of likely mild dehydration more so than other significant acute pathology.  Notable lab abnormalities on work-up include mild hypokalemia potassium 3.1.  Patient's COVID and flu testing is negative.  Patient with findings on UA suggestive of possible early UTI.  After ED evaluation the patient feels significantly improved.  Patient desires discharge home.  Patient is encouraged to push p.o. fluids.   She is also encouraged to follow-up closely with her PCP and have repeat potassium testing done within the next 1 to 2 weeks.  Patient is agreeable with plan to taking antibiotics at home for treatment of possible early UTI.  Importance of close follow-up is stressed.  Strict return precautions given and understood.  Additional history obtained:  External records from outside sources obtained and reviewed including prior ED visits and prior Inpatient records.    Lab Tests:  I ordered and personally interpreted labs.  The pertinent results include: CBC, CMP, UA, troponin x2, COVID, flu   Imaging Studies ordered:  I ordered imaging studies including chest x-ray, CT head I independently visualized and interpreted obtained imaging which showed NAD I agree with the radiologist interpretation.   Cardiac Monitoring:  The patient was maintained on a cardiac monitor.  I personally viewed and interpreted the cardiac monitor which showed an underlying rhythm of: NSR   Medicines ordered:  I ordered medication including potassium, IV fluids for hypokalemia, suspected dehydration Reevaluation of the patient after these medicines showed that the patient: improved    Problem List / ED Course:  Syncope, hypokalemia   Reevaluation:  After the interventions noted above, I reevaluated the patient and found that they have: improved Disposition:  After consideration of the diagnostic results and the patients response to treatment, I feel that the patent would benefit from close outpatient follow-up.          Final Clinical Impression(s) / ED Diagnoses Final diagnoses:  Syncope, unspecified syncope type  Upper respiratory tract infection, unspecified type  Hypokalemia    Rx / DC Orders ED Discharge Orders          Ordered    cephALEXin (KEFLEX) 500 MG capsule  4 times daily        12/18/21 1437              Valarie Merino, MD 12/18/21 1550

## 2021-12-21 LAB — URINE CULTURE

## 2022-01-06 DIAGNOSIS — M48062 Spinal stenosis, lumbar region with neurogenic claudication: Secondary | ICD-10-CM | POA: Diagnosis not present

## 2022-01-06 DIAGNOSIS — M4316 Spondylolisthesis, lumbar region: Secondary | ICD-10-CM | POA: Diagnosis not present

## 2022-01-13 DIAGNOSIS — H353211 Exudative age-related macular degeneration, right eye, with active choroidal neovascularization: Secondary | ICD-10-CM | POA: Diagnosis not present

## 2022-02-01 ENCOUNTER — Other Ambulatory Visit: Payer: Self-pay | Admitting: Internal Medicine

## 2022-02-10 ENCOUNTER — Other Ambulatory Visit: Payer: Self-pay | Admitting: Internal Medicine

## 2022-02-12 DIAGNOSIS — M1990 Unspecified osteoarthritis, unspecified site: Secondary | ICD-10-CM | POA: Diagnosis not present

## 2022-02-12 DIAGNOSIS — M5136 Other intervertebral disc degeneration, lumbar region: Secondary | ICD-10-CM | POA: Diagnosis not present

## 2022-02-12 DIAGNOSIS — M549 Dorsalgia, unspecified: Secondary | ICD-10-CM | POA: Diagnosis not present

## 2022-02-12 DIAGNOSIS — M12811 Other specific arthropathies, not elsewhere classified, right shoulder: Secondary | ICD-10-CM | POA: Diagnosis not present

## 2022-02-12 DIAGNOSIS — L4050 Arthropathic psoriasis, unspecified: Secondary | ICD-10-CM | POA: Diagnosis not present

## 2022-02-12 DIAGNOSIS — Z23 Encounter for immunization: Secondary | ICD-10-CM | POA: Diagnosis not present

## 2022-02-12 DIAGNOSIS — Z79899 Other long term (current) drug therapy: Secondary | ICD-10-CM | POA: Diagnosis not present

## 2022-03-10 DIAGNOSIS — H353211 Exudative age-related macular degeneration, right eye, with active choroidal neovascularization: Secondary | ICD-10-CM | POA: Diagnosis not present

## 2022-03-23 ENCOUNTER — Other Ambulatory Visit: Payer: Self-pay | Admitting: Student

## 2022-03-23 DIAGNOSIS — M48062 Spinal stenosis, lumbar region with neurogenic claudication: Secondary | ICD-10-CM

## 2022-03-27 ENCOUNTER — Ambulatory Visit
Admission: RE | Admit: 2022-03-27 | Discharge: 2022-03-27 | Disposition: A | Discharge status: Home / Self Care | Payer: Medicare Other | Source: Ambulatory Visit | Attending: Student | Admitting: Student

## 2022-03-27 DIAGNOSIS — M545 Low back pain, unspecified: Secondary | ICD-10-CM | POA: Diagnosis not present

## 2022-03-27 DIAGNOSIS — R200 Anesthesia of skin: Secondary | ICD-10-CM | POA: Diagnosis not present

## 2022-03-27 DIAGNOSIS — M48062 Spinal stenosis, lumbar region with neurogenic claudication: Secondary | ICD-10-CM

## 2022-03-31 DIAGNOSIS — M48062 Spinal stenosis, lumbar region with neurogenic claudication: Secondary | ICD-10-CM | POA: Diagnosis not present

## 2022-03-31 DIAGNOSIS — M48061 Spinal stenosis, lumbar region without neurogenic claudication: Secondary | ICD-10-CM | POA: Diagnosis not present

## 2022-03-31 DIAGNOSIS — M5126 Other intervertebral disc displacement, lumbar region: Secondary | ICD-10-CM | POA: Diagnosis not present

## 2022-04-06 ENCOUNTER — Telehealth: Payer: Self-pay | Admitting: Internal Medicine

## 2022-04-06 NOTE — Telephone Encounter (Signed)
Contacted Doris Cheadle to schedule their annual wellness visit. Appointment made for 04/15/22.  [facility] direct phone # [phone]   Patient called to r/s her awv appt

## 2022-04-07 DIAGNOSIS — M5127 Other intervertebral disc displacement, lumbosacral region: Secondary | ICD-10-CM | POA: Diagnosis not present

## 2022-04-09 ENCOUNTER — Telehealth: Payer: Medicare Other | Admitting: Family Medicine

## 2022-04-15 ENCOUNTER — Ambulatory Visit (INDEPENDENT_AMBULATORY_CARE_PROVIDER_SITE_OTHER): Payer: Medicare Other

## 2022-04-15 VITALS — Ht 62.0 in | Wt 165.0 lb

## 2022-04-15 DIAGNOSIS — Z0000 Encounter for general adult medical examination without abnormal findings: Secondary | ICD-10-CM | POA: Diagnosis not present

## 2022-04-15 NOTE — Patient Instructions (Addendum)
Michelle [name] , Thank you for taking time to come for your Medicare Wellness Visit. I appreciate your ongoing commitment to your health goals. Please review the following plan we discussed and let me know if I can assist you in the future.   These are the goals we discussed:  Goals       No current goals (pt-stated)      Patient Stated (pt-stated)      Lose weight.         This is a list of the screening recommended for you and due dates:  Health Maintenance  Topic Date Due   DTaP/Tdap/Td vaccine (2 - Tdap) 02/05/2016   COVID-19 Vaccine (6 - 2023-24 season) 05/01/2022*   Zoster (Shingles) Vaccine (1 of 2) 07/16/2022*   Medicare Annual Wellness Visit  04/15/2023   Pneumonia Vaccine  Completed   Flu Shot  Completed   DEXA scan (bone density measurement)  Completed   HPV Vaccine  Aged Out  *Topic was postponed. The date shown is not the original due date.    Advanced directives: Please bring a copy of your health care power of attorney and living will to the office to be added to your chart at your convenience.   Conditions/risks identified: None  Next appointment: Follow up in one year for your annual wellness visit    Preventive Care 65 Years and Older, Female Preventive care refers to lifestyle choices and visits with your health care provider that can promote health and wellness. What does preventive care include? A yearly physical exam. This is also called an annual well check. Dental exams once or twice a year. Routine eye exams. Ask your health care provider how often you should have your eyes checked. Personal lifestyle choices, including: Daily care of your teeth and gums. Regular physical activity. Eating a healthy diet. Avoiding tobacco and drug use. Limiting alcohol use. Practicing safe sex. Taking low-dose aspirin every day. Taking vitamin and mineral supplements as recommended by your health care provider. What happens during an annual well check? The services  and screenings done by your health care provider during your annual well check will depend on your age, overall health, lifestyle risk factors, and family history of disease. Counseling  Your health care provider may ask you questions about your: Alcohol use. Tobacco use. Drug use. Emotional well-being. Home and relationship well-being. Sexual activity. Eating habits. History of falls. Memory and ability to understand (cognition). Work and work Statistician. Reproductive health. Screening  You may have the following tests or measurements: Height, weight, and BMI. Blood pressure. Lipid and cholesterol levels. These may be checked every 5 years, or more frequently if you are over 52 years old. Skin check. Lung cancer screening. You may have this screening every year starting at age 52 if you have a 30-pack-year history of smoking and currently smoke or have quit within the past 15 years. Fecal occult blood test (FOBT) of the stool. You may have this test every year starting at age 61. Flexible sigmoidoscopy or colonoscopy. You may have a sigmoidoscopy every 5 years or a colonoscopy every 10 years starting at age 34. Hepatitis C blood test. Hepatitis B blood test. Sexually transmitted disease (STD) testing. Diabetes screening. This is done by checking your blood sugar (glucose) after you have not eaten for a while (fasting). You may have this done every 1-3 years. Bone density scan. This is done to screen for osteoporosis. You may have this done starting at age 22. Mammogram.  This may be done every 1-2 years. Talk to your health care provider about how often you should have regular mammograms. Talk with your health care provider about your test results, treatment options, and if necessary, the need for more tests. Vaccines  Your health care provider may recommend certain vaccines, such as: Influenza vaccine. This is recommended every year. Tetanus, diphtheria, and acellular pertussis  (Tdap, Td) vaccine. You may need a Td booster every 10 years. Zoster vaccine. You may need this after age 66. Pneumococcal 13-valent conjugate (PCV13) vaccine. One dose is recommended after age 45. Pneumococcal polysaccharide (PPSV23) vaccine. One dose is recommended after age 71. Talk to your health care provider about which screenings and vaccines you need and how often you need them. This information is not intended to replace advice given to you by your health care provider. Make sure you discuss any questions you have with your health care provider. Document Released: 02/15/2015 Document Revised: 10/09/2015 Document Reviewed: 11/20/2014 Elsevier Interactive Patient Education  2017 [place] Prevention in the Home Falls can cause injuries. They can happen to people of all ages. There are many things you can do to make your home safe and to help prevent falls. What can I do on the outside of my home? Regularly fix the edges of walkways and driveways and fix any cracks. Remove anything that might make you trip as you walk through a door, such as a raised step or threshold. Trim any bushes or trees on the path to your home. Use bright outdoor lighting. Clear any walking paths of anything that might make someone trip, such as rocks or tools. Regularly check to see if handrails are loose or broken. Make sure that both sides of any steps have handrails. Any raised decks and porches should have guardrails on the edges. Have any leaves, snow, or ice cleared regularly. Use sand or salt on walking paths during winter. Clean up any spills in your garage right away. This includes oil or grease spills. What can I do in the bathroom? Use night lights. Install grab bars by the toilet and in the tub and shower. Do not use towel bars as grab bars. Use non-skid mats or decals in the tub or shower. If you need to sit down in the shower, use a plastic, non-slip stool. Keep the floor dry. Clean  up any water that spills on the floor as soon as it happens. Remove soap buildup in the tub or shower regularly. Attach bath mats securely with double-sided non-slip rug tape. Do not have throw rugs and other things on the floor that can make you trip. What can I do in the bedroom? Use night lights. Make sure that you have a light by your bed that is easy to reach. Do not use any sheets or blankets that are too big for your bed. They should not hang down onto the floor. Have a firm chair that has side arms. You can use this for support while you get dressed. Do not have throw rugs and other things on the floor that can make you trip. What can I do in the kitchen? Clean up any spills right away. Avoid walking on wet floors. Keep items that you use a lot in easy-to-reach places. If you need to reach something above you, use a strong step stool that has a grab bar. Keep electrical cords out of the way. Do not use floor polish or wax that makes floors slippery. If you  must use wax, use non-skid floor wax. Do not have throw rugs and other things on the floor that can make you trip. What can I do with my stairs? Do not leave any items on the stairs. Make sure that there are handrails on both sides of the stairs and use them. Fix handrails that are broken or loose. Make sure that handrails are as long as the stairways. Check any carpeting to make sure that it is firmly attached to the stairs. Fix any carpet that is loose or worn. Avoid having throw rugs at the top or bottom of the stairs. If you do have throw rugs, attach them to the floor with carpet tape. Make sure that you have a light switch at the top of the stairs and the bottom of the stairs. If you do not have them, ask someone to add them for you. What else can I do to help prevent falls? Wear shoes that: Do not have high heels. Have rubber bottoms. Are comfortable and fit you well. Are closed at the toe. Do not wear sandals. If you  use a stepladder: Make sure that it is fully opened. Do not climb a closed stepladder. Make sure that both sides of the stepladder are locked into place. Ask someone to hold it for you, if possible. Clearly mark and make sure that you can see: Any grab bars or handrails. First and last steps. Where the edge of each step is. Use tools that help you move around (mobility aids) if they are needed. These include: Canes. Walkers. Scooters. Crutches. Turn on the lights when you go into a dark area. Replace any light bulbs as soon as they burn out. Set up your furniture so you have a clear path. Avoid moving your furniture around. If any of your floors are uneven, fix them. If there are any pets around you, be aware of where they are. Review your medicines with your doctor. Some medicines can make you feel dizzy. This can increase your chance of falling. Ask your doctor what other things that you can do to help prevent falls. This information is not intended to replace advice given to you by your health care provider. Make sure you discuss any questions you have with your health care provider. Document Released: 11/15/2008 Document Revised: 06/27/2015 Document Reviewed: 02/23/2014 Elsevier Interactive Patient Education  2017 [employer].

## 2022-04-15 NOTE — Progress Notes (Signed)
Subjective:   Michelle [name] is a 82 y.o. female who presents for Medicare Annual (Subsequent) preventive examination.  Review of Systems    Virtual Visit via Telephone Note  I connected with  [name] on 04/15/22 at 12:30 PM EDT by telephone and verified that I am speaking with the correct person using two identifiers.  Location: Patient: Home Provider: Office Persons participating in the virtual visit: patient/Nurse Health Advisor   I discussed the limitations, risks, security and privacy concerns of performing an evaluation and management service by telephone and the availability of in person appointments. The patient expressed understanding and agreed to proceed.  Interactive audio and video telecommunications were attempted between this nurse and patient, however failed, due to patient having technical difficulties OR patient did not have access to video capability.  We continued and completed visit with audio only.  Some vital signs may be absent or patient reported.   Criselda Peaches, LPN  Cardiac Risk Factors include: advanced age (>33mn, >>23women);hypertension     Objective:    Today's Vitals   04/15/22 1249  Weight: 165 lb (74.8 kg)  Height: '5\' 2"'$  (1.575 m)   Body mass index is 30.18 kg/m.     04/15/2022   12:54 PM 04/07/2021    1:28 PM 02/10/2021    9:15 AM 01/09/2020    9:38 AM 02/08/2012    5:00 PM 02/08/2012    7:52 AM 01/29/2012   10:30 AM  Advanced Directives  Does Patient Have a Medical Advance Directive? Yes Yes Yes Yes Patient has advance directive, copy in chart  Patient has advance directive, copy in chart  Type of Advance Directive [place] will [place] will [place]Living will Living will Healthcare Power of [place] will  Does patient want to make changes to medical advance directive?  No - Patient declined No - Patient declined       Copy of [place] Chart? No - copy requested No - copy requested No - copy requested      Pre-existing out of facility DNR order (yellow form or pink MOST form)     No No     Current Medications (verified) Outpatient Encounter Medications as of 04/15/2022  Medication Sig   cephALEXin (KEFLEX) 500 MG capsule Take 1 capsule (500 mg total) by mouth 4 (four) times daily.   chlorthalidone (HYGROTON) 25 MG tablet TAKE 1/2 - 1 TABLET BY MOUTH EVERY DAY AS DIRECTED.  *appointment required for future refills   Cholecalciferol (VITAMIN D) 2000 UNITS CAPS Take 2,000 Units by mouth daily.   folic acid (FOLVITE) 1 MG tablet Take 1 mg by mouth daily.   losartan (COZAAR) 100 MG tablet TAKE 1 TABLET (100 MG TOTAL) BY MOUTH DAILY. SCHEDULE APPT FOR FUTURE REFILLS   metoprolol succinate (TOPROL-XL) 50 MG 24 hr tablet TAKE 1 TABLET BY MOUTH EVERY DAY. *appointment required for future refills   Multiple Vitamin (MULTIVITAMIN) tablet Take 1 tablet by mouth daily.   Multiple Vitamins-Minerals (PRESERVISION AREDS) TABS Take 1 tablet by mouth 2 (two) times daily.   vitamin C (ASCORBIC ACID) 250 MG tablet Take 250 mg by mouth daily.   No facility-administered encounter medications on file as of 04/15/2022.    Allergies (verified) Patient has no known allergies.   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   Hypertension  Osteopenia    Dr.John McComb   Past Surgical History:  Procedure Laterality Date   BACK SURGERY  09/2010;02/2012   DISC REMOVED AND SPACER/FUSION PLACED    CATARACT EXTRACTION, BILATERAL     COLONOSCOPY     negative   TOTAL ABDOMINAL HYSTERECTOMY W/ BILATERAL SALPINGOOPHORECTOMY     For Fibroids   TUBAL LIGATION     Family History  Problem Relation Age of Onset   Kidney cancer Father        prostate    Cancer Father    Hypertension Mother        died at age 9    Autoimmune disease Mother        Hemolytic  Anemia   Breast cancer Maternal Aunt    Hypertension Maternal Aunt    Stroke Maternal Grandfather 58   Osteoporosis Other        Mother, 2 M aunts , MGM   Atrial fibrillation Daughter 77   Diabetes Neg Hx    Heart disease Neg Hx    Social History   Socioeconomic History   Marital status: Widowed    Spouse name: Not on file   Number of children: Not on file   Years of education: Not on file   Highest education level: Not on file  Occupational History   Occupation: Retired  Tobacco Use   Smoking status: Never   Smokeless tobacco: Never  Vaping Use   Vaping Use: Never used  Substance and Sexual Activity   Alcohol use: No   Drug use: No   Sexual activity: Not on file  Other Topics Concern   Not on file  Social History Narrative   9 hours of sleep per night   Single living in the home (widow)   No pets   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   Childbirth x3   Social Determinants of Health   Financial Resource Strain: Low Risk  (04/15/2022)   Overall Financial Resource Strain (CARDIA)    Difficulty of Paying Living Expenses: Not hard at all  Food Insecurity: No Food Insecurity (04/15/2022)   Hunger Vital Sign    Worried About Running Out of Food in the Last Year: Never true    Ran Out of Food in the Last Year: Never true  Transportation Needs: No Transportation Needs (04/15/2022)   PRAPARE - Hydrologist (Medical): No    Lack of Transportation (Non-Medical): No  Physical Activity: Sufficiently Active (04/15/2022)   Exercise Vital Sign    Days of Exercise per Week: 4 days    Minutes of Exercise per Session: 60 min  Stress: No Stress Concern Present (04/15/2022)   [place]    Feeling of Stress : Not at all  Social Connections: Moderately Integrated (04/15/2022)   Social Connection  and Isolation Panel [NHANES]    Frequency of Communication with Friends and Family: More than three times a week    Frequency of Social Gatherings with Friends and Family: More than three times a week    Attends Religious Services: More than 4 times per year    Active Member of [employer] or Organizations: Yes    Attends Archivist Meetings: More than 4 times per year    Marital Status: Widowed  Tobacco Counseling Counseling given: Not Answered   Clinical Intake:  Pre-visit preparation completed: No  Pain : No/denies pain     BMI - recorded: 30.18 Nutritional Status: BMI > 30  Obese Nutritional Risks: None Diabetes: No  How often do you need to have someone help you when you read instructions, pamphlets, or other written materials from your doctor or pharmacy?: 1 - Never  Diabetic?  No  Interpreter Needed?: No  Information entered by :: Rolene Arbour LPN   Activities of Daily Living    04/15/2022   12:53 PM  In your present state of health, do you have any difficulty performing the following activities:  Hearing? 0  Vision? 0  Difficulty concentrating or making decisions? 0  Walking or climbing stairs? 0  Dressing or bathing? 0  Doing errands, shopping? 0  Preparing Food and eating ? N  Using the Toilet? N  In the past six months, have you accidently leaked urine? N  Do you have problems with loss of bowel control? N  Managing your Medications? N  Managing your Finances? N  Housekeeping or managing your Housekeeping? N    Patient Care Team: Panosh, Standley Brooking, MD as PCP - General (Internal Medicine) Arvella Nigh, MD as Consulting Physician (Obstetrics and Gynecology) Newman Pies, MD as Consulting Physician (Neurosurgery) Valinda Party, MD (Rheumatology) Sharyne Peach, MD as Consulting Physician (Ophthalmology)  Indicate any recent Medical Services you may have received from other than [facility] providers in the past year (date may be  approximate).     Assessment:   This is a routine wellness examination for [place].  Hearing/Vision screen Hearing Screening - Comments:: Denies hearing difficulties   Vision Screening - Comments:: Wears rx glasses - up to date with routine eye exams with  Dr Delman Cheadle  Dietary issues and exercise activities discussed: Exercise limited by: None identified   Goals Addressed               This Visit's Progress     No current goals (pt-stated)         Depression Screen    04/15/2022   12:53 PM 04/07/2021    1:25 PM 01/09/2020    9:36 AM 11/08/2018   11:46 AM 04/28/2017   10:52 AM 11/20/2015    9:44 AM 11/13/2014    9:08 AM  PHQ 2/9 Scores  PHQ - 2 Score 0 0 0 0 0 0 0    Fall Risk    04/15/2022   12:54 PM 04/07/2021    1:28 PM 01/09/2020    9:40 AM 11/08/2018   11:46 AM 02/17/2018    9:07 AM  [place] in the past year? 0  0 0 0  Number falls in past yr: 0 0 0 0 0  Injury with Fall? 0 0 0 0 0  Risk for fall due to : No Fall Risks No Fall Risks Impaired vision    Follow up Falls prevention discussed  Falls prevention discussed      FALL RISK PREVENTION PERTAINING TO THE HOME:  Any stairs in or around the home? Yes  If so, are there any without handrails? No  Home free of loose throw rugs in walkways, pet beds, electrical cords, etc? Yes  Adequate lighting in your home to reduce risk of falls? Yes   ASSISTIVE DEVICES UTILIZED TO PREVENT FALLS:  Life alert? No  Use of a cane, walker or w/c? No  Grab bars in the bathroom?  Yes  Shower chair or bench in shower? No  Elevated toilet seat or a handicapped toilet? No   TIMED UP AND GO:  Was the test performed? No . Audio Visit   Cognitive Function:        04/15/2022   12:54 PM 04/07/2021    1:29 PM 01/09/2020    9:42 AM  6CIT Screen  What Year? 0 points 0 points 0 points  What month? 0 points 0 points 0 points  What time? 0 points 0 points   Count back from 20 0 points 0 points 0 points  Months in reverse  0 points 0 points 0 points  Repeat phrase 0 points 0 points 0 points  Total Score 0 points 0 points     Immunizations Immunization History  Administered Date(s) Administered   Fluad Quad(high Dose 65+) 11/08/2018, 11/29/2019, 11/08/2020   Influenza Split 12/03/2012   Influenza Whole 12/24/2004, 10/19/2007   Influenza, High Dose Seasonal PF 12/19/2013, 11/07/2014, 11/20/2015, 12/01/2016, 11/06/2021   Influenza,inj,Quad PF,6+ Mos 12/03/2017   PFIZER(Purple Top)SARS-COV-2 Vaccination 02/23/2019, 03/16/2019, 11/18/2019   Pfizer Covid-19 Vaccine Bivalent Booster 33yr & up 10/25/2020   Pneumococcal Conjugate-13 11/13/2014   Pneumococcal Polysaccharide-23 11/25/2009   Td 02/04/2006   Unspecified SARS-COV-2 Vaccination 10/29/2021    TDAP status: Due, Education has been provided regarding the importance of this vaccine. Advised may receive this vaccine at local pharmacy or Health Dept. Aware to provide a copy of the vaccination record if obtained from local pharmacy or Health Dept. Verbalized acceptance and understanding.  Flu Vaccine status: Up to date  Pneumococcal vaccine status: Up to date  Covid-19 vaccine status: Completed vaccines  Qualifies for Shingles Vaccine? Yes   Zostavax completed No   Shingrix Completed?: No.    Education has been provided regarding the importance of this vaccine. Patient has been advised to call insurance company to determine out of pocket expense if they have not yet received this vaccine. Advised may also receive vaccine at local pharmacy or Health Dept. Verbalized acceptance and understanding.  Screening Tests Health Maintenance  Topic Date Due   DTaP/Tdap/Td (2 - Tdap) 02/05/2016   COVID-19 Vaccine (6 - 2023-24 season) 05/01/2022 (Originally 12/24/2021)   Zoster Vaccines- Shingrix (1 of 2) 07/16/2022 (Originally 04/30/1959)   Medicare Annual Wellness (AWV)  04/15/2023   Pneumonia Vaccine 82 Years old  Completed   INFLUENZA VACCINE  Completed    DEXA SCAN  Completed   HPV VACCINES  Aged Out    Health Maintenance  Health Maintenance Due  Topic Date Due   DTaP/Tdap/Td (2 - Tdap) 02/05/2016    Colorectal cancer screening: No longer required.   Mammogram status: No longer required due to Age.  Bone Density status: Completed 02/02/18. Results reflect: Bone density results: OSTEOPOROSIS. Repeat every   years.  Lung Cancer Screening: (Low Dose CT Chest recommended if Age 81-80years, 30 pack-year currently smoking OR have quit w/in 15years.) does not qualify.    Additional Screening:  Hepatitis C Screening: does not qualify; Completed   Vision Screening: Recommended annual ophthalmology exams for early detection of glaucoma and other disorders of the eye. Is the patient up to date with their annual eye exam?  Yes  Who is the provider or what is the name of the office in which the patient attends annual eye exams? Dr GDelman CheadleIf pt is not established with a provider, would they like to be referred to a provider to establish care? No .   Dental Screening:  Recommended annual dental exams for proper oral hygiene  Community Resource Referral / Chronic Care Management:  CRR required this visit?  No   CCM required this visit?  No      Plan:     I have personally reviewed and noted the following in the patient's chart:   Medical and social history Use of alcohol, tobacco or illicit drugs  Current medications and supplements including opioid prescriptions. Patient is not currently taking opioid prescriptions. Functional ability and status Nutritional status Physical activity Advanced directives List of other physicians Hospitalizations, surgeries, and ER visits in previous 12 months Vitals Screenings to include cognitive, depression, and falls Referrals and appointments  In addition, I have reviewed and discussed with patient certain preventive protocols, quality metrics, and best practice recommendations. A written  personalized care plan for preventive services as well as general preventive health recommendations were provided to patient.     Criselda Peaches, LPN   [id number]   Nurse Notes: None

## 2022-04-21 DIAGNOSIS — M48062 Spinal stenosis, lumbar region with neurogenic claudication: Secondary | ICD-10-CM | POA: Diagnosis not present

## 2022-04-30 DIAGNOSIS — M5416 Radiculopathy, lumbar region: Secondary | ICD-10-CM | POA: Diagnosis not present

## 2022-05-03 ENCOUNTER — Other Ambulatory Visit: Payer: Self-pay | Admitting: Internal Medicine

## 2022-05-05 DIAGNOSIS — M5416 Radiculopathy, lumbar region: Secondary | ICD-10-CM | POA: Diagnosis not present

## 2022-05-07 DIAGNOSIS — M5416 Radiculopathy, lumbar region: Secondary | ICD-10-CM | POA: Diagnosis not present

## 2022-05-12 DIAGNOSIS — M5416 Radiculopathy, lumbar region: Secondary | ICD-10-CM | POA: Diagnosis not present

## 2022-05-14 DIAGNOSIS — M5416 Radiculopathy, lumbar region: Secondary | ICD-10-CM | POA: Diagnosis not present

## 2022-05-15 ENCOUNTER — Other Ambulatory Visit: Payer: Self-pay | Admitting: Internal Medicine

## 2022-05-19 DIAGNOSIS — H43813 Vitreous degeneration, bilateral: Secondary | ICD-10-CM | POA: Diagnosis not present

## 2022-05-19 DIAGNOSIS — H353122 Nonexudative age-related macular degeneration, left eye, intermediate dry stage: Secondary | ICD-10-CM | POA: Diagnosis not present

## 2022-05-19 DIAGNOSIS — H35373 Puckering of macula, bilateral: Secondary | ICD-10-CM | POA: Diagnosis not present

## 2022-05-19 DIAGNOSIS — M5416 Radiculopathy, lumbar region: Secondary | ICD-10-CM | POA: Diagnosis not present

## 2022-05-19 DIAGNOSIS — H353211 Exudative age-related macular degeneration, right eye, with active choroidal neovascularization: Secondary | ICD-10-CM | POA: Diagnosis not present

## 2022-05-19 DIAGNOSIS — H35033 Hypertensive retinopathy, bilateral: Secondary | ICD-10-CM | POA: Diagnosis not present

## 2022-05-21 DIAGNOSIS — M5416 Radiculopathy, lumbar region: Secondary | ICD-10-CM | POA: Diagnosis not present

## 2022-05-21 DIAGNOSIS — L4050 Arthropathic psoriasis, unspecified: Secondary | ICD-10-CM | POA: Diagnosis not present

## 2022-05-21 DIAGNOSIS — Z23 Encounter for immunization: Secondary | ICD-10-CM | POA: Diagnosis not present

## 2022-05-21 DIAGNOSIS — M12811 Other specific arthropathies, not elsewhere classified, right shoulder: Secondary | ICD-10-CM | POA: Diagnosis not present

## 2022-05-21 DIAGNOSIS — M1990 Unspecified osteoarthritis, unspecified site: Secondary | ICD-10-CM | POA: Diagnosis not present

## 2022-05-21 DIAGNOSIS — M5136 Other intervertebral disc degeneration, lumbar region: Secondary | ICD-10-CM | POA: Diagnosis not present

## 2022-05-21 DIAGNOSIS — Z79899 Other long term (current) drug therapy: Secondary | ICD-10-CM | POA: Diagnosis not present

## 2022-05-21 DIAGNOSIS — M549 Dorsalgia, unspecified: Secondary | ICD-10-CM | POA: Diagnosis not present

## 2022-05-26 DIAGNOSIS — M5416 Radiculopathy, lumbar region: Secondary | ICD-10-CM | POA: Diagnosis not present

## 2022-06-02 DIAGNOSIS — M5416 Radiculopathy, lumbar region: Secondary | ICD-10-CM | POA: Diagnosis not present

## 2022-06-03 DIAGNOSIS — Z1231 Encounter for screening mammogram for malignant neoplasm of breast: Secondary | ICD-10-CM | POA: Diagnosis not present

## 2022-06-04 DIAGNOSIS — M5416 Radiculopathy, lumbar region: Secondary | ICD-10-CM | POA: Diagnosis not present

## 2022-06-09 ENCOUNTER — Telehealth: Payer: Self-pay

## 2022-06-09 DIAGNOSIS — M5416 Radiculopathy, lumbar region: Secondary | ICD-10-CM | POA: Diagnosis not present

## 2022-06-09 DIAGNOSIS — R928 Other abnormal and inconclusive findings on diagnostic imaging of breast: Secondary | ICD-10-CM | POA: Diagnosis not present

## 2022-06-09 NOTE — Telephone Encounter (Signed)
LVM instructions for pt to call office to schedule CPE with PCP. 

## 2022-06-11 DIAGNOSIS — M5416 Radiculopathy, lumbar region: Secondary | ICD-10-CM | POA: Diagnosis not present

## 2022-06-18 DIAGNOSIS — M5416 Radiculopathy, lumbar region: Secondary | ICD-10-CM | POA: Diagnosis not present

## 2022-06-20 IMAGING — RF DG LUMBAR SPINE 2-3V
1 series · 2 of 2 positions shown · non-contrast
Comparison: Lumbar spine radiographs 02/08/2012

CLINICAL DATA: L2-L3 PLIF

EXAM:
LUMBAR SPINE - 2-3 VIEW

[Series 1: dg x-ray · 0.14mm/px · 2 of 2 slices shown]
[im 1/2]
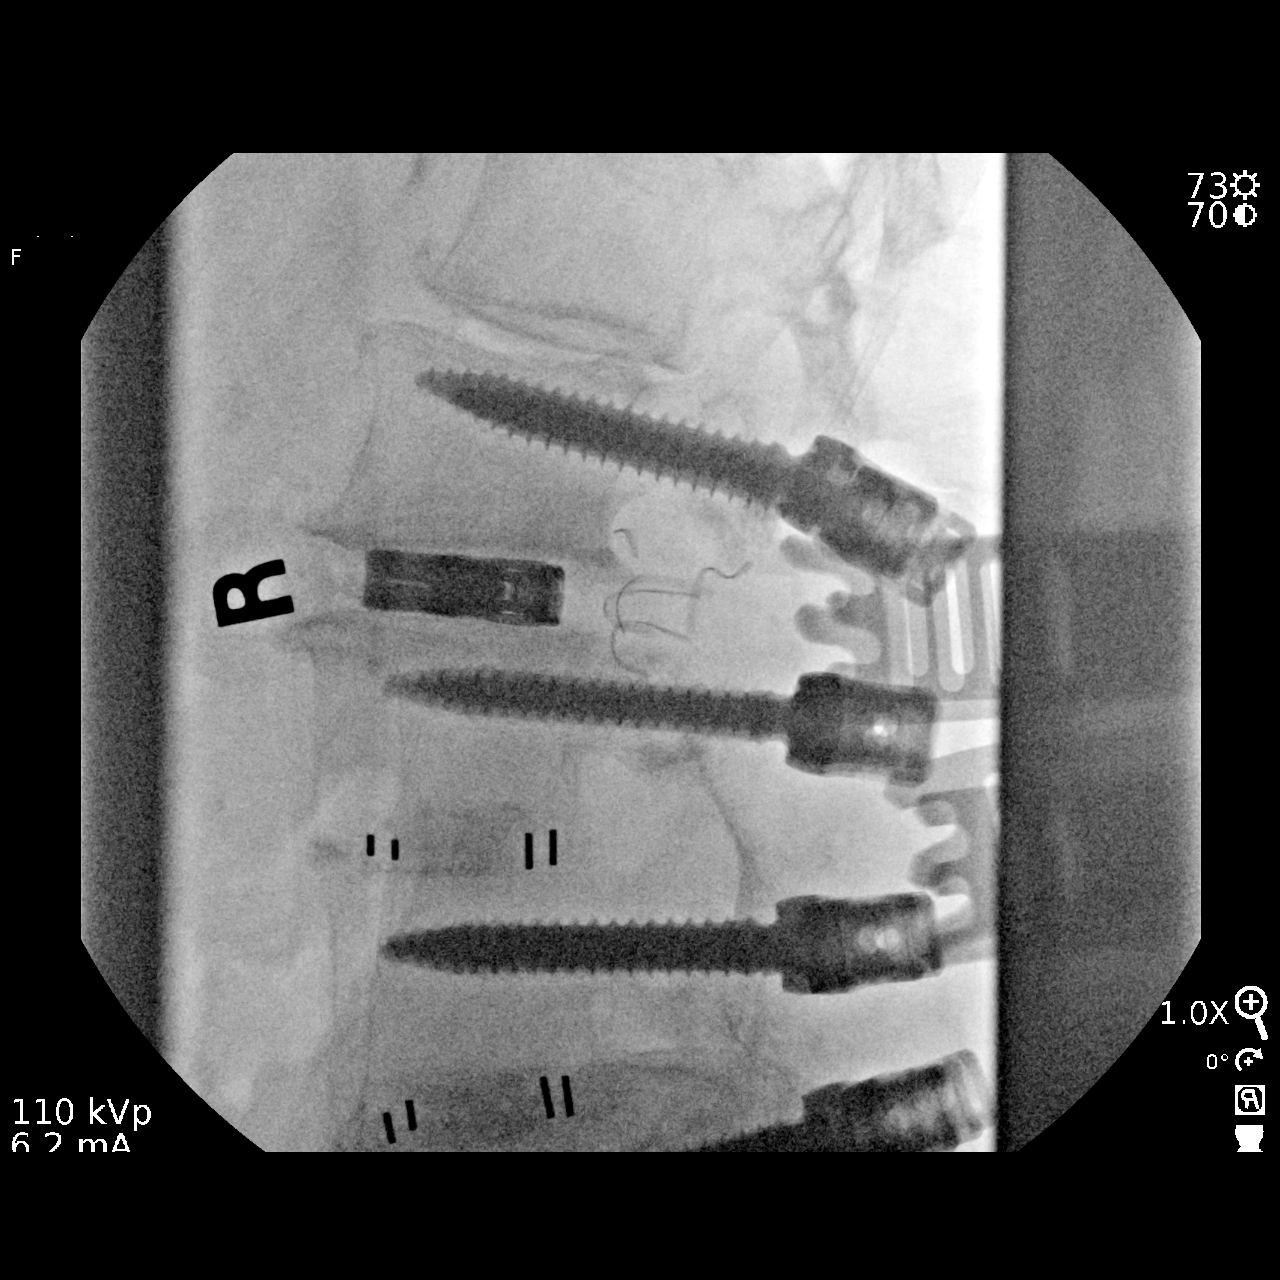
[im 2/2]
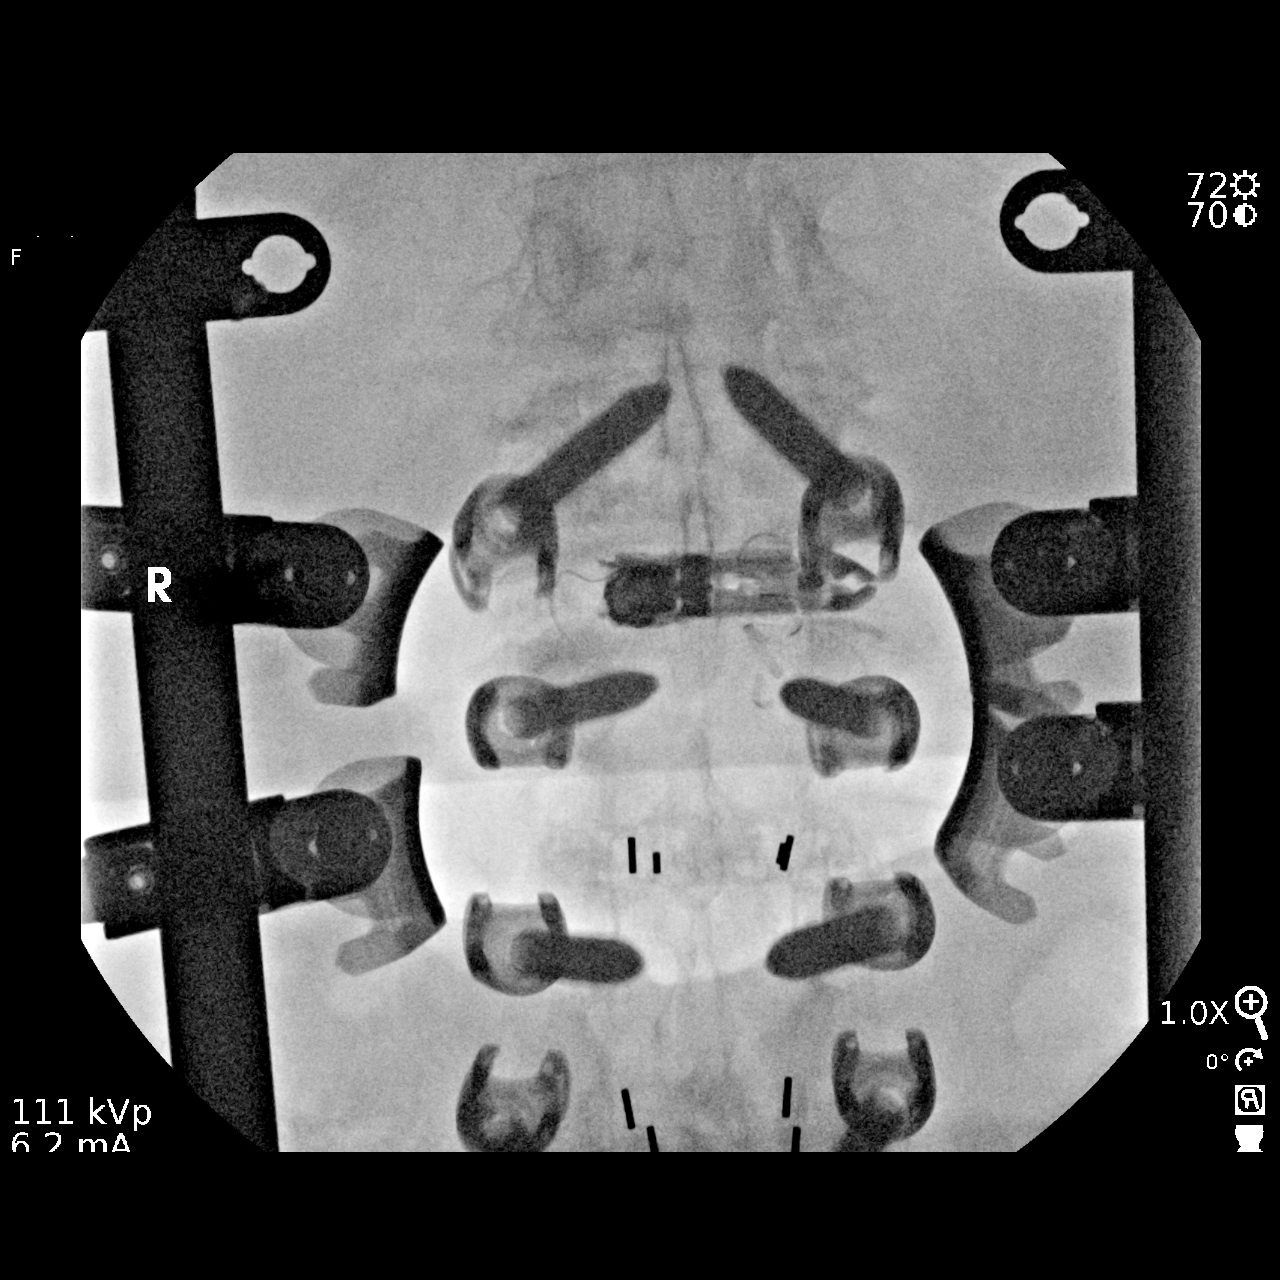

[2 of 2 positions shown; findings below may reference images not displayed]

FINDINGS: Intraoperative images during lumbar fusion revision, with new
posterior and interbody fusion at L2-L3. The posterior fusion rod
has not yet been placed on the last image.
IMPRESSION: Intraoperative images during lumbar fusion revision, extension to
L2-L3 with new posterior and interbody fusion.

## 2022-06-25 DIAGNOSIS — M5416 Radiculopathy, lumbar region: Secondary | ICD-10-CM | POA: Diagnosis not present

## 2022-06-25 NOTE — Telephone Encounter (Signed)
Pt has been sch for 07-29-2022

## 2022-07-02 DIAGNOSIS — M5416 Radiculopathy, lumbar region: Secondary | ICD-10-CM | POA: Diagnosis not present

## 2022-07-10 DIAGNOSIS — M4807 Spinal stenosis, lumbosacral region: Secondary | ICD-10-CM | POA: Diagnosis not present

## 2022-07-10 DIAGNOSIS — M48061 Spinal stenosis, lumbar region without neurogenic claudication: Secondary | ICD-10-CM | POA: Diagnosis not present

## 2022-07-14 DIAGNOSIS — H353211 Exudative age-related macular degeneration, right eye, with active choroidal neovascularization: Secondary | ICD-10-CM | POA: Diagnosis not present

## 2022-07-16 DIAGNOSIS — M5416 Radiculopathy, lumbar region: Secondary | ICD-10-CM | POA: Diagnosis not present

## 2022-07-21 DIAGNOSIS — M5416 Radiculopathy, lumbar region: Secondary | ICD-10-CM | POA: Diagnosis not present

## 2022-07-24 DIAGNOSIS — M5416 Radiculopathy, lumbar region: Secondary | ICD-10-CM | POA: Diagnosis not present

## 2022-07-28 DIAGNOSIS — M5416 Radiculopathy, lumbar region: Secondary | ICD-10-CM | POA: Diagnosis not present

## 2022-07-28 NOTE — Progress Notes (Signed)
No chief complaint on file.   HPI: Patient  Michelle [name]  82 y.o. comes in today for yearly visit  and exam   HT losartan chlrothatlidone  metoprolol   Health Maintenance  Topic Date Due   Zoster Vaccines- Shingrix (1 of 2) Never done   DTaP/Tdap/Td (2 - Tdap) 02/05/2016   COVID-19 Vaccine (6 - 2023-24 season) 12/24/2021   INFLUENZA VACCINE  09/03/2022   Medicare Annual Wellness (AWV)  04/15/2023   Pneumonia Vaccine 38+ Years old  Completed   DEXA SCAN  Completed   HPV VACCINES  Aged Out   Health Maintenance Review LIFESTYLE:  Exercise:   Tobacco/ETS: Alcohol:  Sugar beverages: Sleep: Drug use: no HH of      ROS:  GEN/ HEENT: No fever, significant weight changes sweats headaches vision problems hearing changes, CV/ PULM; No chest pain shortness of breath cough, syncope,edema  change in exercise tolerance. GI /GU: No adominal pain, vomiting, change in bowel habits. No blood in the stool. No significant GU symptoms. SKIN/HEME: ,no acute skin rashes suspicious lesions or bleeding. No lymphadenopathy, nodules, masses.  NEURO/ PSYCH:  No neurologic signs such as weakness numbness. No depression anxiety. IMM/ Allergy: No unusual infections.  Allergy .   REST of 12 system review negative except as per HPI   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   Hypertension    Osteopenia    Dr.John McComb    Past Surgical History:  Procedure Laterality Date   BACK SURGERY  09/2010;02/2012   DISC REMOVED AND SPACER/FUSION PLACED    CATARACT EXTRACTION, BILATERAL     COLONOSCOPY     negative   TOTAL ABDOMINAL HYSTERECTOMY W/ BILATERAL SALPINGOOPHORECTOMY     For Fibroids   TUBAL LIGATION      Family History  Problem Relation Age of Onset   Kidney cancer Father        prostate    Cancer Father    Hypertension Mother        died at age 27    Autoimmune disease Mother        Hemolytic Anemia   Breast cancer Maternal Aunt     Hypertension Maternal Aunt    Stroke Maternal Grandfather 18   Osteoporosis Other        Mother, 2 M aunts , MGM   Atrial fibrillation Daughter 43   Diabetes Neg Hx    Heart disease Neg Hx     Social History   Socioeconomic History   Marital status: Widowed    Spouse name: Not on file   Number of children: Not on file   Years of education: Not on file   Highest education level: Not on file  Occupational History   Occupation: Retired  Tobacco Use   Smoking status: Never   Smokeless tobacco: Never  Vaping Use   Vaping Use: Never used  Substance and Sexual Activity   Alcohol use: No   Drug use: No   Sexual activity: Not on file  Other Topics Concern   Not on file  Social History Narrative   9 hours of sleep per night   Single living in the home (widow)   No pets   Retired   Copywriter, advertising 3  . 2 local 1 in [place]      From  [place]  then [place] back to [employer] with exercise.   Childbirth x3  Social Determinants of Health   Financial Resource Strain: Low Risk  (04/15/2022)   Overall Financial Resource Strain (CARDIA)    Difficulty of Paying Living Expenses: Not hard at all  Food Insecurity: No Food Insecurity (04/15/2022)   Hunger Vital Sign    Worried About Running Out of Food in the Last Year: Never true    Ran Out of Food in the Last Year: Never true  Transportation Needs: No Transportation Needs (04/15/2022)   PRAPARE - Administrator, Civil Service (Medical): No    Lack of Transportation (Non-Medical): No  Physical Activity: Sufficiently Active (04/15/2022)   Exercise Vital Sign    Days of Exercise per Week: 4 days    Minutes of Exercise per Session: 60 min  Stress: No Stress Concern Present (04/15/2022)   [employer] of Occupational Health - Occupational Stress Questionnaire    Feeling of Stress : Not at all  Social Connections: Moderately Integrated (04/15/2022)   Social Connection and Isolation Panel [NHANES]    Frequency  of Communication with Friends and Family: More than three times a week    Frequency of Social Gatherings with Friends and Family: More than three times a week    Attends Religious Services: More than 4 times per year    Active Member of [employer] or Organizations: Yes    Attends Banker Meetings: More than 4 times per year    Marital Status: Widowed    Outpatient Medications Prior to Visit  Medication Sig Dispense Refill   cephALEXin (KEFLEX) 500 MG capsule Take 1 capsule (500 mg total) by mouth 4 (four) times daily. 28 capsule 0   chlorthalidone (HYGROTON) 25 MG tablet TAKE 1/2 - 1 TABLET BY MOUTH EVERY DAY AS DIRECTED. *APPOINTMENT REQUIRED FOR FUTURE REFILLS 90 tablet 0   Cholecalciferol (VITAMIN D) 2000 UNITS CAPS Take 2,000 Units by mouth daily.     folic acid (FOLVITE) 1 MG tablet Take 1 mg by mouth daily.     losartan (COZAAR) 100 MG tablet TAKE 1 TABLET (100 MG TOTAL) BY MOUTH DAILY. SCHEDULE APPT FOR FUTURE REFILLS 90 tablet 0   metoprolol succinate (TOPROL-XL) 50 MG 24 hr tablet TAKE 1 TABLET BY MOUTH EVERY DAY. *APPOINTMENT REQUIRED FOR FUTURE REFILLS 90 tablet 0   Multiple Vitamin (MULTIVITAMIN) tablet Take 1 tablet by mouth daily.     Multiple Vitamins-Minerals (PRESERVISION AREDS) TABS Take 1 tablet by mouth 2 (two) times daily.     vitamin C (ASCORBIC ACID) 250 MG tablet Take 250 mg by mouth daily.     No facility-administered medications prior to visit.     EXAM:  LMP 04/19/1997   There is no height or weight on file to calculate BMI. Wt Readings from Last 3 Encounters:  04/15/22 165 lb (74.8 kg)  12/18/21 165 lb (74.8 kg)  12/17/21 165 lb 9.6 oz (75.1 kg)    Physical Exam: Vital signs reviewed DDU:KGUR is a well-developed well-nourished alert cooperative    who appearsr stated age in no acute distress.  HEENT: normocephalic atraumatic , Eyes: PERRL EOM's full, conjunctiva clear, Nares: paten,t no deformity discharge or tenderness., Ears: no deformity  EAC's clear TMs with normal landmarks. Mouth: clear OP, no lesions, edema.  Moist mucous membranes. Dentition in adequate repair. NECK: supple without masses, thyromegaly or bruits. CHEST/PULM:  Clear to auscultation and percussion breath sounds equal no wheeze , rales or rhonchi. No chest wall deformities or tenderness. Breast: normal by inspection . No dimpling, discharge,  masses, tenderness or discharge . CV: PMI is nondisplaced, S1 S2 no gallops, murmurs, rubs. Peripheral pulses are full without delay.No JVD .  ABDOMEN: Bowel sounds normal nontender  No guard or rebound, no hepato splenomegal no CVA tenderness.  No hernia. Extremtities:  No clubbing cyanosis or edema, no acute joint swelling or redness no focal atrophy NEURO:  Oriented x3, cranial nerves 3-12 appear to be intact, no obvious focal weakness,gait within normal limits no abnormal reflexes or asymmetrical SKIN: No acute rashes normal turgor, color, no bruising or petechiae. PSYCH: Oriented, good eye contact, no obvious depression anxiety, cognition and judgment appear normal. LN: no cervical axillary inguinal adenopathy  Lab Results  Component Value Date   WBC 5.5 12/18/2021   HGB 14.6 12/18/2021   HCT 43.0 12/18/2021   PLT 242 12/18/2021   GLUCOSE 114 (H) 12/18/2021   CHOL 150 04/29/2017   TRIG 76.0 04/29/2017   HDL 60.30 04/29/2017   LDLCALC 75 04/29/2017   ALT 26 12/18/2021   AST 32 12/18/2021   NA 135 12/18/2021   K 3.2 (L) 12/18/2021   CL 96 (L) 12/18/2021   CREATININE 0.90 12/18/2021   BUN 21 12/18/2021   CO2 25 12/18/2021   TSH 1.12 11/13/2014   HGBA1C 5.6 11/15/2018    BP Readings from Last 3 Encounters:  12/18/21 137/74  12/17/21 110/80  02/13/21 (!) 110/55    Lab plan reviewed with patient   ASSESSMENT AND PLAN:  Discussed the following assessment and plan:  No diagnosis found. No follow-ups on file.  Patient Care Team: Joshalyn Ancheta, Neta Mends, MD as PCP - General (Internal Medicine) Richardean Chimera,  MD as Consulting Physician (Obstetrics and Gynecology) Tressie Stalker, MD as Consulting Physician (Neurosurgery) Rossie Muskrat, MD (Rheumatology) Elise Benne, MD as Consulting Physician (Ophthalmology) There are no Patient Instructions on file for this visit.  Neta Mends. Zale Marcotte M.D.

## 2022-07-29 ENCOUNTER — Ambulatory Visit (INDEPENDENT_AMBULATORY_CARE_PROVIDER_SITE_OTHER): Payer: Medicare Other | Admitting: Internal Medicine

## 2022-07-29 ENCOUNTER — Encounter: Payer: Self-pay | Admitting: Internal Medicine

## 2022-07-29 VITALS — BP 118/78 | HR 65 | Temp 98.2°F | Ht 62.0 in | Wt 165.0 lb

## 2022-07-29 DIAGNOSIS — L4050 Arthropathic psoriasis, unspecified: Secondary | ICD-10-CM | POA: Diagnosis not present

## 2022-07-29 DIAGNOSIS — M48062 Spinal stenosis, lumbar region with neurogenic claudication: Secondary | ICD-10-CM

## 2022-07-29 DIAGNOSIS — I10 Essential (primary) hypertension: Secondary | ICD-10-CM | POA: Diagnosis not present

## 2022-07-29 DIAGNOSIS — R7309 Other abnormal glucose: Secondary | ICD-10-CM | POA: Diagnosis not present

## 2022-07-29 DIAGNOSIS — Z79899 Other long term (current) drug therapy: Secondary | ICD-10-CM

## 2022-07-29 DIAGNOSIS — G479 Sleep disorder, unspecified: Secondary | ICD-10-CM

## 2022-07-29 DIAGNOSIS — R430 Anosmia: Secondary | ICD-10-CM

## 2022-07-29 LAB — LIPID PANEL
Cholesterol: 155 mg/dL (ref 0–200)
HDL: 50.2 mg/dL (ref >39.00)
LDL Cholesterol: 83 mg/dL (ref 0–99)
NonHDL: 104.36
Total CHOL/HDL Ratio: 3
Triglycerides: 108 mg/dL (ref 0.0–149.0)
VLDL: 21.6 mg/dL (ref 0.0–40.0)

## 2022-07-29 LAB — CBC WITH DIFFERENTIAL/PLATELET
Basophils Absolute: 0.1 10*3/uL (ref 0.0–0.1)
Basophils Relative: 1 % (ref 0.0–3.0)
Eosinophils Absolute: 0.2 10*3/uL (ref 0.0–0.7)
Eosinophils Relative: 2 % (ref 0.0–5.0)
HCT: 40.9 % (ref 36.0–46.0)
Hemoglobin: 13.4 g/dL (ref 12.0–15.0)
Lymphocytes Relative: 27.6 % (ref 12.0–46.0)
Lymphs Abs: 2.6 10*3/uL (ref 0.7–4.0)
MCHC: 32.8 g/dL (ref 30.0–36.0)
MCV: 94.2 fl (ref 78.0–100.0)
Monocytes Absolute: 1 10*3/uL (ref 0.1–1.0)
Monocytes Relative: 11 % (ref 3.0–12.0)
Neutro Abs: 5.6 10*3/uL (ref 1.4–7.7)
Neutrophils Relative %: 58.4 % (ref 43.0–77.0)
Platelets: 365 10*3/uL (ref 150.0–400.0)
RBC: 4.34 Mil/uL (ref 3.87–5.11)
RDW: 13.6 % (ref 11.5–15.5)
WBC: 9.5 10*3/uL (ref 4.0–10.5)

## 2022-07-29 LAB — TSH: TSH: 1.32 u[IU]/mL (ref 0.35–5.50)

## 2022-07-29 LAB — HEMOGLOBIN A1C: Hgb A1c MFr Bld: 5.5 % (ref 4.6–6.5)

## 2022-07-29 LAB — BASIC METABOLIC PANEL
BUN: 21 mg/dL (ref 6–23)
CO2: 31 mEq/L (ref 19–32)
Calcium: 10.4 mg/dL (ref 8.4–10.5)
Chloride: 97 mEq/L (ref 96–112)
Creatinine, Ser: 0.76 mg/dL (ref 0.40–1.20)
GFR: 73.58 mL/min (ref >60.00)
Glucose, Bld: 102 mg/dL — ABNORMAL HIGH (ref 70–99)
Potassium: 3.8 mEq/L (ref 3.5–5.1)
Sodium: 134 mEq/L — ABNORMAL LOW (ref 135–145)

## 2022-07-29 LAB — HEPATIC FUNCTION PANEL
ALT: 19 U/L (ref 0–35)
AST: 27 U/L (ref 0–37)
Albumin: 4.2 g/dL (ref 3.5–5.2)
Alkaline Phosphatase: 88 U/L (ref 39–117)
Bilirubin, Direct: 0.1 mg/dL (ref 0.0–0.3)
Total Bilirubin: 0.8 mg/dL (ref 0.2–1.2)
Total Protein: 7.4 g/dL (ref 6.0–8.3)

## 2022-07-29 MED ORDER — LOSARTAN POTASSIUM 100 MG PO TABS: 100.0000 mg | ORAL_TABLET | Freq: Every day | ORAL | 0 refills | Status: DC

## 2022-07-29 MED ORDER — METOPROLOL SUCCINATE ER 50 MG PO TB24: ORAL_TABLET | ORAL | 3 refills | Status: DC

## 2022-07-29 MED ORDER — CHLORTHALIDONE 25 MG PO TABS: 25.0000 mg | ORAL_TABLET | Freq: Every day | ORAL | 3 refills | Status: DC

## 2022-07-29 MED ORDER — METOPROLOL SUCCINATE ER 50 MG PO TB24: ORAL_TABLET | ORAL | 0 refills | Status: DC

## 2022-07-29 NOTE — Progress Notes (Signed)
Kidney function thyroid blood count  cholesterol  liver results are good  and no diabetes.    No change in medication

## 2022-07-29 NOTE — Patient Instructions (Addendum)
Lab today  Bp is good   Refilled medication today . If all ok then yearly check .  Ok to take  tylenol at night ad ocass benadryl melatonin

## 2022-07-30 DIAGNOSIS — M5416 Radiculopathy, lumbar region: Secondary | ICD-10-CM | POA: Diagnosis not present

## 2022-08-04 DIAGNOSIS — M5416 Radiculopathy, lumbar region: Secondary | ICD-10-CM | POA: Diagnosis not present

## 2022-08-11 DIAGNOSIS — M5416 Radiculopathy, lumbar region: Secondary | ICD-10-CM | POA: Diagnosis not present

## 2022-08-13 DIAGNOSIS — M5416 Radiculopathy, lumbar region: Secondary | ICD-10-CM | POA: Diagnosis not present

## 2022-08-18 DIAGNOSIS — M5416 Radiculopathy, lumbar region: Secondary | ICD-10-CM | POA: Diagnosis not present

## 2022-09-01 DIAGNOSIS — M5416 Radiculopathy, lumbar region: Secondary | ICD-10-CM | POA: Diagnosis not present

## 2022-09-03 DIAGNOSIS — M549 Dorsalgia, unspecified: Secondary | ICD-10-CM | POA: Diagnosis not present

## 2022-09-03 DIAGNOSIS — L4050 Arthropathic psoriasis, unspecified: Secondary | ICD-10-CM | POA: Diagnosis not present

## 2022-09-03 DIAGNOSIS — M5136 Other intervertebral disc degeneration, lumbar region: Secondary | ICD-10-CM | POA: Diagnosis not present

## 2022-09-03 DIAGNOSIS — M1990 Unspecified osteoarthritis, unspecified site: Secondary | ICD-10-CM | POA: Diagnosis not present

## 2022-09-03 DIAGNOSIS — Z79899 Other long term (current) drug therapy: Secondary | ICD-10-CM | POA: Diagnosis not present

## 2022-09-03 DIAGNOSIS — M12811 Other specific arthropathies, not elsewhere classified, right shoulder: Secondary | ICD-10-CM | POA: Diagnosis not present

## 2022-09-08 DIAGNOSIS — M5416 Radiculopathy, lumbar region: Secondary | ICD-10-CM | POA: Diagnosis not present

## 2022-09-21 DIAGNOSIS — H353211 Exudative age-related macular degeneration, right eye, with active choroidal neovascularization: Secondary | ICD-10-CM | POA: Diagnosis not present

## 2022-09-22 DIAGNOSIS — M5416 Radiculopathy, lumbar region: Secondary | ICD-10-CM | POA: Diagnosis not present

## 2022-10-06 DIAGNOSIS — M5416 Radiculopathy, lumbar region: Secondary | ICD-10-CM | POA: Diagnosis not present

## 2022-10-13 DIAGNOSIS — M5416 Radiculopathy, lumbar region: Secondary | ICD-10-CM | POA: Diagnosis not present

## 2022-10-16 DIAGNOSIS — Z23 Encounter for immunization: Secondary | ICD-10-CM | POA: Diagnosis not present

## 2022-10-27 DIAGNOSIS — M5416 Radiculopathy, lumbar region: Secondary | ICD-10-CM | POA: Diagnosis not present

## 2022-10-28 ENCOUNTER — Other Ambulatory Visit: Payer: Self-pay | Admitting: Internal Medicine

## 2022-10-28 DIAGNOSIS — Z23 Encounter for immunization: Secondary | ICD-10-CM | POA: Diagnosis not present

## 2022-11-05 DIAGNOSIS — M5416 Radiculopathy, lumbar region: Secondary | ICD-10-CM | POA: Diagnosis not present

## 2022-11-17 DIAGNOSIS — M5416 Radiculopathy, lumbar region: Secondary | ICD-10-CM | POA: Diagnosis not present

## 2022-11-18 ENCOUNTER — Encounter: Payer: Self-pay | Admitting: Adult Health

## 2022-11-18 ENCOUNTER — Ambulatory Visit (INDEPENDENT_AMBULATORY_CARE_PROVIDER_SITE_OTHER): Payer: Medicare Other | Admitting: Adult Health

## 2022-11-18 VITALS — BP 120/80 | HR 56 | Temp 98.2°F | Ht 62.0 in | Wt 162.0 lb

## 2022-11-18 DIAGNOSIS — F5101 Primary insomnia: Secondary | ICD-10-CM | POA: Diagnosis not present

## 2022-11-18 MED ORDER — TRAZODONE HCL 50 MG PO TABS: 25.0000 mg | ORAL_TABLET | Freq: Every evening | ORAL | 0 refills | Status: DC | PRN

## 2022-11-18 NOTE — Progress Notes (Signed)
Subjective:    Patient ID: Michelle [name], female    DOB: 08-Nov-1940, 82 y.o.   MRN: [id number]  Insomnia   82 year old female who  has a past medical history of Arthritis, Hypertension, and Osteopenia.  She is a patient of Dr. Fabian Sharp who I am seeing today for an acute visit. She reports that over the last year she has suffered from insomnia that seems to be getting worse. She reports that she has trouble staying asleep ( will sleep 3 hours and then be up pretty much for the rest of the day).  Over the year she has tried OT melatonin and Benadryl - neither of which worked well for her. She has also has tried working out more in the hopes that she would be exhausted and sleep through the night. She is not taking naps in the daytime.    Review of Systems  Psychiatric/Behavioral:  The patient has insomnia.    See HPI   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   Hypertension    Osteopenia    Dr.John McComb    Social History   Socioeconomic History   Marital status: Widowed    Spouse name: Not on file   Number of children: Not on file   Years of education: Not on file   Highest education level: Not on file  Occupational History   Occupation: Retired  Tobacco Use   Smoking status: Never   Smokeless tobacco: Never  Vaping Use   Vaping status: Never Used  Substance and Sexual Activity   Alcohol use: No   Drug use: No   Sexual activity: Not on file  Other Topics Concern   Not on file  Social History Narrative   9 hours of sleep per night   Single living in the home (widow)   No pets   Retired   Copywriter, advertising 3  . 2 local 1 in [place]      From  [place] and  then [place] back to [employer] with exercise.   Childbirth x3   Social Determinants of Health   Financial Resource Strain: Low Risk  (04/15/2022)   Overall Financial Resource Strain (CARDIA)    Difficulty of Paying Living Expenses: Not hard at all  Food  Insecurity: No Food Insecurity (04/15/2022)   Hunger Vital Sign    Worried About Running Out of Food in the Last Year: Never true    Ran Out of Food in the Last Year: Never true  Transportation Needs: No Transportation Needs (04/15/2022)   PRAPARE - Administrator, Civil Service (Medical): No    Lack of Transportation (Non-Medical): No  Physical Activity: Sufficiently Active (04/15/2022)   Exercise Vital Sign    Days of Exercise per Week: 4 days    Minutes of Exercise per Session: 60 min  Stress: No Stress Concern Present (04/15/2022)   [employer] of Occupational Health - Occupational Stress Questionnaire    Feeling of Stress : Not at all  Social Connections: Moderately Integrated (04/15/2022)   Social Connection and Isolation Panel [NHANES]    Frequency of Communication with Friends and Family: More than three times a week    Frequency of Social Gatherings with Friends and Family: More than three times a week    Attends Religious Services: More than 4 times per year    Active Member of [employer] or Organizations: Yes  Attends Banker Meetings: More than 4 times per year    Marital Status: Widowed  Intimate Partner Violence: Not At Risk (04/15/2022)   Humiliation, Afraid, Rape, and Kick questionnaire    Fear of Current or Ex-Partner: No    Emotionally Abused: No    Physically Abused: No    Sexually Abused: No    Past Surgical History:  Procedure Laterality Date   BACK SURGERY  09/2010;02/2012   DISC REMOVED AND SPACER/FUSION PLACED    CATARACT EXTRACTION, BILATERAL     COLONOSCOPY     negative   TOTAL ABDOMINAL HYSTERECTOMY W/ BILATERAL SALPINGOOPHORECTOMY     For Fibroids   TUBAL LIGATION      Family History  Problem Relation Age of Onset   Kidney cancer Father        prostate    Cancer Father    Hypertension Mother        died at age 59    Autoimmune disease Mother        Hemolytic Anemia   Breast cancer Maternal Aunt    Hypertension  Maternal Aunt    Stroke Maternal Grandfather 34   Osteoporosis Other        Mother, 2 M aunts , MGM   Atrial fibrillation Daughter 11   Diabetes Neg Hx    Heart disease Neg Hx     No Known Allergies  Current Outpatient Medications on File Prior to Visit  Medication Sig Dispense Refill   chlorthalidone (HYGROTON) 25 MG tablet Take 1 tablet (25 mg total) by mouth daily. 90 tablet 3   Cholecalciferol (VITAMIN D) 2000 UNITS CAPS Take 2,000 Units by mouth daily.     folic acid (FOLVITE) 1 MG tablet Take 1 mg by mouth daily.     losartan (COZAAR) 100 MG tablet TAKE 1 TABLET BY MOUTH EVERY DAY 90 tablet 0   methotrexate (RHEUMATREX) 2.5 MG tablet Take 2.5 mg by mouth once a week. Caution:Chemotherapy. Protect from light. 5 tab once a week.     metoprolol succinate (TOPROL-XL) 50 MG 24 hr tablet Take with or immediately following a meal. 90 tablet 3   Multiple Vitamin (MULTIVITAMIN) tablet Take 1 tablet by mouth daily.     Multiple Vitamins-Minerals (PRESERVISION AREDS) TABS Take 1 tablet by mouth 2 (two) times daily.     No current facility-administered medications on file prior to visit.    BP 120/80   Pulse (!) 56   Temp 98.2 F (36.8 C) (Oral)   Ht 5\' 2"  (1.575 m)   Wt 162 lb (73.5 kg)   LMP 04/19/1997   SpO2 97%   BMI 29.63 kg/m       Objective:   Physical Exam Vitals and nursing note reviewed.  Constitutional:      Appearance: Normal appearance.  Cardiovascular:     Rate and Rhythm: Normal rate and regular rhythm.     Pulses: Normal pulses.     Heart sounds: Normal heart sounds.  Pulmonary:     Effort: Pulmonary effort is normal.     Breath sounds: Normal breath sounds.  Skin:    General: Skin is warm and dry.  Neurological:     General: No focal deficit present.     Mental Status: She is alert and oriented to person, place, and time.  Psychiatric:        Mood and Affect: Mood normal.        Behavior: Behavior normal.  Thought Content: Thought content  normal.        Judgment: Judgment normal.       Assessment & Plan:   1. Primary insomnia - Will start on Trazodone 25 mg x 2-3 nights, if not improving then go up to a full tab at bedtime - Follow up with PCP in 2 weeks for further dose adjustment  - traZODone (DESYREL) 50 MG tablet; Take 0.5-1 tablets (25-50 mg total) by mouth at bedtime as needed for sleep.  Dispense: 30 tablet; Refill: 0   Shirline Frees, NP

## 2022-11-20 DIAGNOSIS — H35373 Puckering of macula, bilateral: Secondary | ICD-10-CM | POA: Diagnosis not present

## 2022-11-20 DIAGNOSIS — H35033 Hypertensive retinopathy, bilateral: Secondary | ICD-10-CM | POA: Diagnosis not present

## 2022-11-20 DIAGNOSIS — H353211 Exudative age-related macular degeneration, right eye, with active choroidal neovascularization: Secondary | ICD-10-CM | POA: Diagnosis not present

## 2022-11-20 DIAGNOSIS — H353122 Nonexudative age-related macular degeneration, left eye, intermediate dry stage: Secondary | ICD-10-CM | POA: Diagnosis not present

## 2022-11-20 DIAGNOSIS — H43813 Vitreous degeneration, bilateral: Secondary | ICD-10-CM | POA: Diagnosis not present

## 2022-11-24 DIAGNOSIS — H18513 Endothelial corneal dystrophy, bilateral: Secondary | ICD-10-CM | POA: Diagnosis not present

## 2022-11-24 DIAGNOSIS — H353212 Exudative age-related macular degeneration, right eye, with inactive choroidal neovascularization: Secondary | ICD-10-CM | POA: Diagnosis not present

## 2022-11-24 DIAGNOSIS — H52203 Unspecified astigmatism, bilateral: Secondary | ICD-10-CM | POA: Diagnosis not present

## 2022-11-24 DIAGNOSIS — H353121 Nonexudative age-related macular degeneration, left eye, early dry stage: Secondary | ICD-10-CM | POA: Diagnosis not present

## 2022-11-26 DIAGNOSIS — M5416 Radiculopathy, lumbar region: Secondary | ICD-10-CM | POA: Diagnosis not present

## 2022-12-09 NOTE — Progress Notes (Unsigned)
No chief complaint on file.   HPI: Michelle [name] 82 y.o. come in for fu problem seen by CN for  on going  insomnia  10 16  Began trazodone  and plan for fu  ROS: See pertinent positives and negatives per HPI.  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   Hypertension    Osteopenia    Dr.John McComb    Family History  Problem Relation Age of Onset   Kidney cancer Father        prostate    Cancer Father    Hypertension Mother        died at age 25    Autoimmune disease Mother        Hemolytic Anemia   Breast cancer Maternal Aunt    Hypertension Maternal Aunt    Stroke Maternal Grandfather 69   Osteoporosis Other        Mother, 2 M aunts , MGM   Atrial fibrillation Daughter 3   Diabetes Neg Hx    Heart disease Neg Hx     Social History   Socioeconomic History   Marital status: Widowed    Spouse name: Not on file   Number of children: Not on file   Years of education: Not on file   Highest education level: Not on file  Occupational History   Occupation: Retired  Tobacco Use   Smoking status: Never   Smokeless tobacco: Never  Vaping Use   Vaping status: Never Used  Substance and Sexual Activity   Alcohol use: No   Drug use: No   Sexual activity: Not on file  Other Topics Concern   Not on file  Social History Narrative   9 hours of sleep per night   Single living in the home (widow)   No pets   Retired   Copywriter, 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   Childbirth x3   Social Determinants of Health   Financial Resource Strain: Low Risk  (04/15/2022)   Overall Financial Resource Strain (CARDIA)    Difficulty of Paying Living Expenses: Not hard at all  Food Insecurity: No Food Insecurity (04/15/2022)   Hunger Vital Sign    Worried About Running Out of Food in the Last Year: Never true    Ran Out of Food in the Last Year: Never true   Transportation Needs: No Transportation Needs (04/15/2022)   PRAPARE - Administrator, Civil Service (Medical): No    Lack of Transportation (Non-Medical): No  Physical Activity: Sufficiently Active (04/15/2022)   Exercise Vital Sign    Days of Exercise per Week: 4 days    Minutes of Exercise per Session: 60 min  Stress: No Stress Concern Present (04/15/2022)   [employer] of Occupational Health - Occupational Stress Questionnaire    Feeling of Stress : Not at all  Social Connections: Moderately Integrated (04/15/2022)   Social Connection and Isolation Panel [NHANES]    Frequency of Communication with Friends and Family: More than three times a week    Frequency of Social Gatherings with Friends and Family: More than three times a week    Attends Religious Services: More than 4 times per year    Active Member of [employer] or Organizations: Yes    Attends Banker Meetings: More  than 4 times per year    Marital Status: Widowed    Outpatient Medications Prior to Visit  Medication Sig Dispense Refill   chlorthalidone (HYGROTON) 25 MG tablet Take 1 tablet (25 mg total) by mouth daily. 90 tablet 3   Cholecalciferol (VITAMIN D) 2000 UNITS CAPS Take 2,000 Units by mouth daily.     folic acid (FOLVITE) 1 MG tablet Take 1 mg by mouth daily.     losartan (COZAAR) 100 MG tablet TAKE 1 TABLET BY MOUTH EVERY DAY 90 tablet 0   methotrexate (RHEUMATREX) 2.5 MG tablet Take 2.5 mg by mouth once a week. Caution:Chemotherapy. Protect from light. 5 tab once a week.     metoprolol succinate (TOPROL-XL) 50 MG 24 hr tablet Take with or immediately following a meal. 90 tablet 3   Multiple Vitamin (MULTIVITAMIN) tablet Take 1 tablet by mouth daily.     Multiple Vitamins-Minerals (PRESERVISION AREDS) TABS Take 1 tablet by mouth 2 (two) times daily.     traZODone (DESYREL) 50 MG tablet Take 0.5-1 tablets (25-50 mg total) by mouth at bedtime as needed for sleep. 30 tablet 0   No  facility-administered medications prior to visit.     EXAM:  LMP 04/19/1997   There is no height or weight on file to calculate BMI.  GENERAL: vitals reviewed and listed above, alert, oriented, appears well hydrated and in no acute distress HEENT: atraumatic, conjunctiva  clear, no obvious abnormalities on inspection of external nose and ears OP : no lesion edema or exudate  NECK: no obvious masses on inspection palpation  LUNGS: clear to auscultation bilaterally, no wheezes, rales or rhonchi, good air movement CV: HRRR, no clubbing cyanosis or  peripheral edema nl cap refill  MS: moves all extremities without noticeable focal  abnormality PSYCH: pleasant and cooperative, no obvious depression or anxiety Lab Results  Component Value Date   WBC 9.5 07/29/2022   HGB 13.4 07/29/2022   HCT 40.9 07/29/2022   PLT 365.0 07/29/2022   GLUCOSE 102 (H) 07/29/2022   CHOL 155 07/29/2022   TRIG 108.0 07/29/2022   HDL 50.20 07/29/2022   LDLCALC 83 07/29/2022   ALT 19 07/29/2022   AST 27 07/29/2022   NA 134 (L) 07/29/2022   K 3.8 07/29/2022   CL 97 07/29/2022   CREATININE 0.76 07/29/2022   BUN 21 07/29/2022   CO2 31 07/29/2022   TSH 1.32 07/29/2022   HGBA1C 5.5 07/29/2022   BP Readings from Last 3 Encounters:  11/18/22 120/80  07/29/22 118/78  12/18/21 137/74    ASSESSMENT AND PLAN:  Discussed the following assessment and plan:  Primary insomnia  Medication management  -Patient advised to return or notify health care team  if  new concerns arise.  There are no Patient Instructions on file for this visit.   Neta Mends. Xian Alves M.D.

## 2022-12-10 ENCOUNTER — Encounter: Payer: Self-pay | Admitting: Internal Medicine

## 2022-12-10 ENCOUNTER — Ambulatory Visit (INDEPENDENT_AMBULATORY_CARE_PROVIDER_SITE_OTHER): Payer: Medicare Other | Admitting: Internal Medicine

## 2022-12-10 VITALS — BP 134/80 | HR 84 | Temp 98.4°F | Ht 62.0 in | Wt 164.4 lb

## 2022-12-10 DIAGNOSIS — F5101 Primary insomnia: Secondary | ICD-10-CM | POA: Diagnosis not present

## 2022-12-10 DIAGNOSIS — M1990 Unspecified osteoarthritis, unspecified site: Secondary | ICD-10-CM | POA: Diagnosis not present

## 2022-12-10 DIAGNOSIS — Z79899 Other long term (current) drug therapy: Secondary | ICD-10-CM

## 2022-12-10 DIAGNOSIS — R059 Cough, unspecified: Secondary | ICD-10-CM

## 2022-12-10 DIAGNOSIS — L4050 Arthropathic psoriasis, unspecified: Secondary | ICD-10-CM | POA: Diagnosis not present

## 2022-12-10 DIAGNOSIS — M12811 Other specific arthropathies, not elsewhere classified, right shoulder: Secondary | ICD-10-CM | POA: Diagnosis not present

## 2022-12-10 DIAGNOSIS — M51369 Other intervertebral disc degeneration, lumbar region without mention of lumbar back pain or lower extremity pain: Secondary | ICD-10-CM | POA: Diagnosis not present

## 2022-12-10 DIAGNOSIS — M549 Dorsalgia, unspecified: Secondary | ICD-10-CM | POA: Diagnosis not present

## 2022-12-10 LAB — POC COVID19 BINAXNOW: SARS Coronavirus 2 Ag: NEGATIVE

## 2022-12-10 MED ORDER — TRAZODONE HCL 50 MG PO TABS: 25.0000 mg | ORAL_TABLET | Freq: Every evening | ORAL | 3 refills | Status: DC | PRN

## 2022-12-10 NOTE — Patient Instructions (Signed)
Lujgs are clear  and I agree a chest head cold  should improve with time.   Ok to continue  trazodone as you are doing  At some point can try  taking intermittently or lower dose  but routine  is best.

## 2022-12-11 DIAGNOSIS — Z6830 Body mass index (BMI) 30.0-30.9, adult: Secondary | ICD-10-CM | POA: Diagnosis not present

## 2022-12-11 DIAGNOSIS — M4316 Spondylolisthesis, lumbar region: Secondary | ICD-10-CM | POA: Diagnosis not present

## 2022-12-11 DIAGNOSIS — M1611 Unilateral primary osteoarthritis, right hip: Secondary | ICD-10-CM | POA: Diagnosis not present

## 2022-12-11 DIAGNOSIS — M4807 Spinal stenosis, lumbosacral region: Secondary | ICD-10-CM | POA: Diagnosis not present

## 2022-12-11 DIAGNOSIS — R1031 Right lower quadrant pain: Secondary | ICD-10-CM | POA: Diagnosis not present

## 2022-12-11 DIAGNOSIS — M25551 Pain in right hip: Secondary | ICD-10-CM | POA: Diagnosis not present

## 2022-12-11 LAB — LAB REPORT - SCANNED: EGFR: 84

## 2022-12-16 DIAGNOSIS — M48062 Spinal stenosis, lumbar region with neurogenic claudication: Secondary | ICD-10-CM | POA: Diagnosis not present

## 2023-01-05 DIAGNOSIS — H353211 Exudative age-related macular degeneration, right eye, with active choroidal neovascularization: Secondary | ICD-10-CM | POA: Diagnosis not present

## 2023-02-16 DIAGNOSIS — H353211 Exudative age-related macular degeneration, right eye, with active choroidal neovascularization: Secondary | ICD-10-CM | POA: Diagnosis not present

## 2023-03-08 DIAGNOSIS — M8588 Other specified disorders of bone density and structure, other site: Secondary | ICD-10-CM | POA: Diagnosis not present

## 2023-03-08 DIAGNOSIS — Z01419 Encounter for gynecological examination (general) (routine) without abnormal findings: Secondary | ICD-10-CM | POA: Diagnosis not present

## 2023-03-08 DIAGNOSIS — Z6830 Body mass index (BMI) 30.0-30.9, adult: Secondary | ICD-10-CM | POA: Diagnosis not present

## 2023-03-08 DIAGNOSIS — N958 Other specified menopausal and perimenopausal disorders: Secondary | ICD-10-CM | POA: Diagnosis not present

## 2023-03-22 DIAGNOSIS — M48062 Spinal stenosis, lumbar region with neurogenic claudication: Secondary | ICD-10-CM | POA: Diagnosis not present

## 2023-03-30 DIAGNOSIS — H353211 Exudative age-related macular degeneration, right eye, with active choroidal neovascularization: Secondary | ICD-10-CM | POA: Diagnosis not present

## 2023-04-07 ENCOUNTER — Other Ambulatory Visit: Payer: Self-pay | Admitting: Internal Medicine

## 2023-04-07 DIAGNOSIS — F5101 Primary insomnia: Secondary | ICD-10-CM

## 2023-04-08 DIAGNOSIS — M1990 Unspecified osteoarthritis, unspecified site: Secondary | ICD-10-CM | POA: Diagnosis not present

## 2023-04-08 DIAGNOSIS — M12811 Other specific arthropathies, not elsewhere classified, right shoulder: Secondary | ICD-10-CM | POA: Diagnosis not present

## 2023-04-08 DIAGNOSIS — L4050 Arthropathic psoriasis, unspecified: Secondary | ICD-10-CM | POA: Diagnosis not present

## 2023-04-08 DIAGNOSIS — Z79899 Other long term (current) drug therapy: Secondary | ICD-10-CM | POA: Diagnosis not present

## 2023-04-08 DIAGNOSIS — M549 Dorsalgia, unspecified: Secondary | ICD-10-CM | POA: Diagnosis not present

## 2023-04-08 DIAGNOSIS — M51369 Other intervertebral disc degeneration, lumbar region without mention of lumbar back pain or lower extremity pain: Secondary | ICD-10-CM | POA: Diagnosis not present

## 2023-04-08 LAB — BASIC METABOLIC PANEL WITH GFR
BUN: 20 (ref 4–21)
CO2: 28 — AB (ref 13–22)
Chloride: 98 — AB (ref 99–108)
Creatinine: 0.6 (ref 0.5–1.1)
Glucose: 96
Potassium: 4.1 meq/L (ref 3.5–5.1)
Sodium: 138 (ref 137–147)

## 2023-04-08 LAB — COMPREHENSIVE METABOLIC PANEL WITH GFR
Albumin: 4 (ref 3.5–5.0)
Calcium: 10 (ref 8.7–10.7)
Globulin: 2.6
eGFR: 89

## 2023-04-08 LAB — POCT ERYTHROCYTE SEDIMENTATION RATE, NON-AUTOMATED: Sed Rate: 8

## 2023-04-09 LAB — LAB REPORT - SCANNED: EGFR: 89

## 2023-04-17 ENCOUNTER — Other Ambulatory Visit: Payer: Self-pay | Admitting: Family

## 2023-05-05 DIAGNOSIS — G5603 Carpal tunnel syndrome, bilateral upper limbs: Secondary | ICD-10-CM | POA: Diagnosis not present

## 2023-05-11 DIAGNOSIS — H35373 Puckering of macula, bilateral: Secondary | ICD-10-CM | POA: Diagnosis not present

## 2023-05-11 DIAGNOSIS — Z961 Presence of intraocular lens: Secondary | ICD-10-CM | POA: Diagnosis not present

## 2023-05-11 DIAGNOSIS — H43813 Vitreous degeneration, bilateral: Secondary | ICD-10-CM | POA: Diagnosis not present

## 2023-05-11 DIAGNOSIS — H35033 Hypertensive retinopathy, bilateral: Secondary | ICD-10-CM | POA: Diagnosis not present

## 2023-05-11 DIAGNOSIS — H353211 Exudative age-related macular degeneration, right eye, with active choroidal neovascularization: Secondary | ICD-10-CM | POA: Diagnosis not present

## 2023-05-11 DIAGNOSIS — H353122 Nonexudative age-related macular degeneration, left eye, intermediate dry stage: Secondary | ICD-10-CM | POA: Diagnosis not present

## 2023-06-08 DIAGNOSIS — M4807 Spinal stenosis, lumbosacral region: Secondary | ICD-10-CM | POA: Diagnosis not present

## 2023-06-08 DIAGNOSIS — M5442 Lumbago with sciatica, left side: Secondary | ICD-10-CM | POA: Diagnosis not present

## 2023-06-22 DIAGNOSIS — M5416 Radiculopathy, lumbar region: Secondary | ICD-10-CM | POA: Diagnosis not present

## 2023-06-22 DIAGNOSIS — H353211 Exudative age-related macular degeneration, right eye, with active choroidal neovascularization: Secondary | ICD-10-CM | POA: Diagnosis not present

## 2023-06-29 DIAGNOSIS — M5416 Radiculopathy, lumbar region: Secondary | ICD-10-CM | POA: Diagnosis not present

## 2023-06-30 DIAGNOSIS — Z1231 Encounter for screening mammogram for malignant neoplasm of breast: Secondary | ICD-10-CM | POA: Diagnosis not present

## 2023-07-01 DIAGNOSIS — M5416 Radiculopathy, lumbar region: Secondary | ICD-10-CM | POA: Diagnosis not present

## 2023-07-06 DIAGNOSIS — M5416 Radiculopathy, lumbar region: Secondary | ICD-10-CM | POA: Diagnosis not present

## 2023-07-07 DIAGNOSIS — G5603 Carpal tunnel syndrome, bilateral upper limbs: Secondary | ICD-10-CM | POA: Diagnosis not present

## 2023-07-08 DIAGNOSIS — L4050 Arthropathic psoriasis, unspecified: Secondary | ICD-10-CM | POA: Diagnosis not present

## 2023-07-08 DIAGNOSIS — Z79899 Other long term (current) drug therapy: Secondary | ICD-10-CM | POA: Diagnosis not present

## 2023-07-08 DIAGNOSIS — M51369 Other intervertebral disc degeneration, lumbar region without mention of lumbar back pain or lower extremity pain: Secondary | ICD-10-CM | POA: Diagnosis not present

## 2023-07-08 DIAGNOSIS — M12811 Other specific arthropathies, not elsewhere classified, right shoulder: Secondary | ICD-10-CM | POA: Diagnosis not present

## 2023-07-08 DIAGNOSIS — M549 Dorsalgia, unspecified: Secondary | ICD-10-CM | POA: Diagnosis not present

## 2023-07-08 DIAGNOSIS — M1990 Unspecified osteoarthritis, unspecified site: Secondary | ICD-10-CM | POA: Diagnosis not present

## 2023-07-08 LAB — CBC AND DIFFERENTIAL
EGFR: 87
HCT: 41 (ref 36–46)
Hemoglobin: 13.8 (ref 12.0–16.0)
WBC: 11.5

## 2023-07-08 LAB — CBC: RBC: 4.36 (ref 3.87–5.11)

## 2023-07-12 DIAGNOSIS — M48062 Spinal stenosis, lumbar region with neurogenic claudication: Secondary | ICD-10-CM | POA: Diagnosis not present

## 2023-07-20 ENCOUNTER — Encounter: Payer: Self-pay | Admitting: Internal Medicine

## 2023-07-20 DIAGNOSIS — M5416 Radiculopathy, lumbar region: Secondary | ICD-10-CM | POA: Diagnosis not present

## 2023-07-27 DIAGNOSIS — M5416 Radiculopathy, lumbar region: Secondary | ICD-10-CM | POA: Diagnosis not present

## 2023-07-31 ENCOUNTER — Other Ambulatory Visit: Payer: Self-pay | Admitting: Internal Medicine

## 2023-08-01 ENCOUNTER — Other Ambulatory Visit: Payer: Self-pay | Admitting: Internal Medicine

## 2023-08-02 DIAGNOSIS — M5416 Radiculopathy, lumbar region: Secondary | ICD-10-CM | POA: Diagnosis not present

## 2023-08-02 NOTE — Telephone Encounter (Signed)
 Attempted to reach pt to schedule a yearly check up to keep her yearly med supply. Left a voicemail to call us  back.

## 2023-08-03 ENCOUNTER — Other Ambulatory Visit: Payer: Self-pay | Admitting: Internal Medicine

## 2023-08-03 DIAGNOSIS — F5101 Primary insomnia: Secondary | ICD-10-CM

## 2023-08-03 DIAGNOSIS — H353211 Exudative age-related macular degeneration, right eye, with active choroidal neovascularization: Secondary | ICD-10-CM | POA: Diagnosis not present

## 2023-08-04 ENCOUNTER — Other Ambulatory Visit: Payer: Self-pay | Admitting: Internal Medicine

## 2023-08-09 ENCOUNTER — Other Ambulatory Visit: Payer: Self-pay

## 2023-08-09 MED ORDER — LOSARTAN POTASSIUM 100 MG PO TABS: 100.0000 mg | ORAL_TABLET | Freq: Every day | ORAL | 0 refills | Status: DC

## 2023-08-10 ENCOUNTER — Encounter: Payer: Self-pay | Admitting: Internal Medicine

## 2023-08-10 DIAGNOSIS — Z79899 Other long term (current) drug therapy: Secondary | ICD-10-CM

## 2023-08-14 NOTE — Telephone Encounter (Signed)
 I Dont see any results  or the fax      in the EHR     I saw a note from dr Leni  from June but no mention of calcium  Team can you help get me the info?

## 2023-08-17 NOTE — Telephone Encounter (Signed)
 Thanks I got the results today . Calcium was 11.0  Plan future labs  make sure you are well hydrated  and I will place the orders  Make fu appt to review results and  make a plan ( can be virtual or in person.

## 2023-08-23 DIAGNOSIS — G5603 Carpal tunnel syndrome, bilateral upper limbs: Secondary | ICD-10-CM | POA: Diagnosis not present

## 2023-08-25 DIAGNOSIS — M48062 Spinal stenosis, lumbar region with neurogenic claudication: Secondary | ICD-10-CM | POA: Diagnosis not present

## 2023-09-02 ENCOUNTER — Other Ambulatory Visit (INDEPENDENT_AMBULATORY_CARE_PROVIDER_SITE_OTHER)

## 2023-09-02 ENCOUNTER — Ambulatory Visit: Payer: Self-pay | Admitting: Internal Medicine

## 2023-09-02 DIAGNOSIS — Z79899 Other long term (current) drug therapy: Secondary | ICD-10-CM | POA: Diagnosis not present

## 2023-09-02 LAB — BASIC METABOLIC PANEL WITH GFR
BUN: 17 mg/dL (ref 6–23)
CO2: 30 meq/L (ref 19–32)
Calcium: 10 mg/dL (ref 8.4–10.5)
Chloride: 99 meq/L (ref 96–112)
Creatinine, Ser: 0.7 mg/dL (ref 0.40–1.20)
GFR: 80.59 mL/min (ref >60.00)
Glucose, Bld: 97 mg/dL (ref 70–99)
Potassium: 3.9 meq/L (ref 3.5–5.1)
Sodium: 136 meq/L (ref 135–145)

## 2023-09-02 LAB — VITAMIN D 25 HYDROXY (VIT D DEFICIENCY, FRACTURES): VITD: 73.67 ng/mL (ref 30.00–100.00)

## 2023-09-02 NOTE — Progress Notes (Signed)
 Calcium and vit d are normal.   Waiting on PTh test

## 2023-09-03 ENCOUNTER — Other Ambulatory Visit: Payer: Self-pay | Admitting: Internal Medicine

## 2023-09-03 DIAGNOSIS — F5101 Primary insomnia: Secondary | ICD-10-CM

## 2023-09-03 LAB — PTH, INTACT AND CALCIUM
Calcium: 10.1 mg/dL (ref 8.6–10.4)
PTH: 17 pg/mL (ref 16–77)

## 2023-09-03 LAB — CALCIUM, IONIZED: Calcium, Ion: 5.7 mg/dL — ABNORMAL HIGH (ref 4.7–5.5)

## 2023-09-06 ENCOUNTER — Encounter

## 2023-09-06 NOTE — Progress Notes (Signed)
 Ionized calcium is elevated ( a better  assessment of  real  blood calcium)  rest labs normal  So yes stay off  calcium supplements  and  should repeat in future   Suggest a follow up with provider who ordered the original test or me  to discuss.  And make a plan for follow up.

## 2023-09-07 DIAGNOSIS — H353211 Exudative age-related macular degeneration, right eye, with active choroidal neovascularization: Secondary | ICD-10-CM | POA: Diagnosis not present

## 2023-09-07 NOTE — Telephone Encounter (Signed)
 Ok for 90 days with one refill   last visit with me was 11 24

## 2023-09-10 ENCOUNTER — Encounter

## 2023-10-05 DIAGNOSIS — Z79899 Other long term (current) drug therapy: Secondary | ICD-10-CM | POA: Diagnosis not present

## 2023-10-05 DIAGNOSIS — M51369 Other intervertebral disc degeneration, lumbar region without mention of lumbar back pain or lower extremity pain: Secondary | ICD-10-CM | POA: Diagnosis not present

## 2023-10-05 DIAGNOSIS — L4050 Arthropathic psoriasis, unspecified: Secondary | ICD-10-CM | POA: Diagnosis not present

## 2023-10-05 DIAGNOSIS — M1990 Unspecified osteoarthritis, unspecified site: Secondary | ICD-10-CM | POA: Diagnosis not present

## 2023-10-19 DIAGNOSIS — H43813 Vitreous degeneration, bilateral: Secondary | ICD-10-CM | POA: Diagnosis not present

## 2023-10-19 DIAGNOSIS — H353122 Nonexudative age-related macular degeneration, left eye, intermediate dry stage: Secondary | ICD-10-CM | POA: Diagnosis not present

## 2023-10-19 DIAGNOSIS — H35373 Puckering of macula, bilateral: Secondary | ICD-10-CM | POA: Diagnosis not present

## 2023-10-19 DIAGNOSIS — H35033 Hypertensive retinopathy, bilateral: Secondary | ICD-10-CM | POA: Diagnosis not present

## 2023-10-19 DIAGNOSIS — H353211 Exudative age-related macular degeneration, right eye, with active choroidal neovascularization: Secondary | ICD-10-CM | POA: Diagnosis not present

## 2023-10-19 DIAGNOSIS — Z961 Presence of intraocular lens: Secondary | ICD-10-CM | POA: Diagnosis not present

## 2023-11-06 ENCOUNTER — Other Ambulatory Visit: Payer: Self-pay | Admitting: Internal Medicine

## 2023-11-07 ENCOUNTER — Other Ambulatory Visit: Payer: Self-pay | Admitting: Internal Medicine

## 2023-11-08 DIAGNOSIS — Z4789 Encounter for other orthopedic aftercare: Secondary | ICD-10-CM | POA: Diagnosis not present

## 2023-11-08 DIAGNOSIS — G5602 Carpal tunnel syndrome, left upper limb: Secondary | ICD-10-CM | POA: Diagnosis not present

## 2023-11-22 DIAGNOSIS — M48062 Spinal stenosis, lumbar region with neurogenic claudication: Secondary | ICD-10-CM | POA: Diagnosis not present

## 2023-11-23 DIAGNOSIS — M79642 Pain in left hand: Secondary | ICD-10-CM | POA: Diagnosis not present

## 2023-11-30 DIAGNOSIS — H353211 Exudative age-related macular degeneration, right eye, with active choroidal neovascularization: Secondary | ICD-10-CM | POA: Diagnosis not present

## 2023-12-10 DIAGNOSIS — Z23 Encounter for immunization: Secondary | ICD-10-CM | POA: Diagnosis not present

## 2023-12-14 ENCOUNTER — Encounter: Admitting: Family Medicine

## 2024-01-04 DIAGNOSIS — L4050 Arthropathic psoriasis, unspecified: Secondary | ICD-10-CM | POA: Diagnosis not present

## 2024-01-11 DIAGNOSIS — H353211 Exudative age-related macular degeneration, right eye, with active choroidal neovascularization: Secondary | ICD-10-CM | POA: Diagnosis not present

## 2024-02-02 ENCOUNTER — Other Ambulatory Visit: Payer: Self-pay | Admitting: Internal Medicine

## 2024-02-02 NOTE — Telephone Encounter (Signed)
 Attempted to reach pt. Left a detail message to call us  back to set up follow up with Dr. Charlett for further refill on med.

## 2024-03-01 ENCOUNTER — Other Ambulatory Visit: Payer: Self-pay

## 2024-03-01 ENCOUNTER — Other Ambulatory Visit: Payer: Self-pay | Admitting: Internal Medicine

## 2024-03-01 MED ORDER — LOSARTAN POTASSIUM 100 MG PO TABS: 100.0000 mg | ORAL_TABLET | Freq: Every day | ORAL | 0 refills | Status: AC

## 2024-03-10 ENCOUNTER — Other Ambulatory Visit: Payer: Self-pay | Admitting: Internal Medicine

## 2024-03-10 DIAGNOSIS — F5101 Primary insomnia: Secondary | ICD-10-CM

## 2024-03-22 ENCOUNTER — Ambulatory Visit

## 2024-04-25 ENCOUNTER — Ambulatory Visit: Admitting: Internal Medicine
